# Patient Record
Sex: Female | Born: 1995 | Race: Black or African American | Hispanic: No | Marital: Single | Smoking: Never smoker
Health system: Southern US, Community
[De-identification: ages and names within clinical notes are randomized; demographics above are authoritative.]

## PROBLEM LIST (undated history)

## (undated) DIAGNOSIS — E119 Type 2 diabetes mellitus without complications: Secondary | ICD-10-CM

## (undated) DIAGNOSIS — F329 Major depressive disorder, single episode, unspecified: Secondary | ICD-10-CM

## (undated) DIAGNOSIS — F32A Depression, unspecified: Secondary | ICD-10-CM

## (undated) DIAGNOSIS — F419 Anxiety disorder, unspecified: Secondary | ICD-10-CM

## (undated) HISTORY — DX: Type 2 diabetes mellitus without complications: E11.9

---

## 2016-07-19 DIAGNOSIS — E669 Obesity, unspecified: Secondary | ICD-10-CM | POA: Insufficient documentation

## 2016-07-19 DIAGNOSIS — E1140 Type 2 diabetes mellitus with diabetic neuropathy, unspecified: Secondary | ICD-10-CM | POA: Insufficient documentation

## 2016-07-19 DIAGNOSIS — E1165 Type 2 diabetes mellitus with hyperglycemia: Secondary | ICD-10-CM | POA: Insufficient documentation

## 2016-11-07 ENCOUNTER — Inpatient Hospital Stay
Admission: EM | Admit: 2016-11-07 | Discharge: 2016-11-19 | DRG: 885 | Disposition: A | Discharge status: Home / Self Care | Payer: BLUE CROSS/BLUE SHIELD | Source: Intra-hospital | Attending: Psychiatry | Admitting: Psychiatry

## 2016-11-07 ENCOUNTER — Ambulatory Visit (HOSPITAL_COMMUNITY)
Admission: RE | Admit: 2016-11-07 | Discharge: 2016-11-07 | Disposition: A | Discharge status: Home / Self Care | Payer: BLUE CROSS/BLUE SHIELD | Attending: Psychiatry | Admitting: Psychiatry

## 2016-11-07 ENCOUNTER — Encounter: Payer: Self-pay | Admitting: Psychiatry

## 2016-11-07 ENCOUNTER — Encounter (HOSPITAL_COMMUNITY): Payer: Self-pay | Admitting: Emergency Medicine

## 2016-11-07 ENCOUNTER — Emergency Department (HOSPITAL_COMMUNITY)
Admission: EM | Admit: 2016-11-07 | Discharge: 2016-11-07 | Disposition: A | Discharge status: Other Acute Inpatient Hospital | Payer: BLUE CROSS/BLUE SHIELD | Attending: Emergency Medicine | Admitting: Emergency Medicine

## 2016-11-07 DIAGNOSIS — R45851 Suicidal ideations: Secondary | ICD-10-CM | POA: Diagnosis present

## 2016-11-07 DIAGNOSIS — Z5181 Encounter for therapeutic drug level monitoring: Secondary | ICD-10-CM | POA: Insufficient documentation

## 2016-11-07 DIAGNOSIS — F332 Major depressive disorder, recurrent severe without psychotic features: Secondary | ICD-10-CM | POA: Diagnosis present

## 2016-11-07 DIAGNOSIS — Z79899 Other long term (current) drug therapy: Secondary | ICD-10-CM | POA: Diagnosis not present

## 2016-11-07 DIAGNOSIS — T43296A Underdosing of other antidepressants, initial encounter: Secondary | ICD-10-CM | POA: Diagnosis present

## 2016-11-07 DIAGNOSIS — E119 Type 2 diabetes mellitus without complications: Secondary | ICD-10-CM | POA: Diagnosis present

## 2016-11-07 DIAGNOSIS — R630 Anorexia: Secondary | ICD-10-CM | POA: Diagnosis present

## 2016-11-07 DIAGNOSIS — Z9119 Patient's noncompliance with other medical treatment and regimen: Secondary | ICD-10-CM

## 2016-11-07 DIAGNOSIS — G4700 Insomnia, unspecified: Secondary | ICD-10-CM | POA: Diagnosis present

## 2016-11-07 DIAGNOSIS — Z7984 Long term (current) use of oral hypoglycemic drugs: Secondary | ICD-10-CM

## 2016-11-07 DIAGNOSIS — Z9114 Patient's other noncompliance with medication regimen: Secondary | ICD-10-CM | POA: Diagnosis not present

## 2016-11-07 DIAGNOSIS — F322 Major depressive disorder, single episode, severe without psychotic features: Secondary | ICD-10-CM

## 2016-11-07 DIAGNOSIS — F4310 Post-traumatic stress disorder, unspecified: Secondary | ICD-10-CM | POA: Diagnosis present

## 2016-11-07 DIAGNOSIS — F411 Generalized anxiety disorder: Secondary | ICD-10-CM | POA: Diagnosis present

## 2016-11-07 DIAGNOSIS — J00 Acute nasopharyngitis [common cold]: Secondary | ICD-10-CM | POA: Diagnosis present

## 2016-11-07 DIAGNOSIS — T43226A Underdosing of selective serotonin reuptake inhibitors, initial encounter: Secondary | ICD-10-CM | POA: Diagnosis present

## 2016-11-07 DIAGNOSIS — Z915 Personal history of self-harm: Secondary | ICD-10-CM | POA: Diagnosis not present

## 2016-11-07 HISTORY — DX: Major depressive disorder, single episode, unspecified: F32.9

## 2016-11-07 HISTORY — DX: Depression, unspecified: F32.A

## 2016-11-07 HISTORY — DX: Type 2 diabetes mellitus without complications: E11.9

## 2016-11-07 HISTORY — DX: Anxiety disorder, unspecified: F41.9

## 2016-11-07 LAB — COMPREHENSIVE METABOLIC PANEL
ALK PHOS: 46 U/L (ref 38–126)
ALT: 10 U/L — AB (ref 14–54)
AST: 16 U/L (ref 15–41)
Albumin: 4.1 g/dL (ref 3.5–5.0)
Anion gap: 7 (ref 5–15)
BUN: 8 mg/dL (ref 6–20)
CALCIUM: 9.5 mg/dL (ref 8.9–10.3)
CHLORIDE: 104 mmol/L (ref 101–111)
CO2: 25 mmol/L (ref 22–32)
Creatinine, Ser: 0.65 mg/dL (ref 0.44–1.00)
GFR calc Af Amer: >60 mL/min (ref >60)
GFR calc non Af Amer: >60 mL/min (ref >60)
GLUCOSE: 164 mg/dL — AB (ref 65–99)
Potassium: 3.6 mmol/L (ref 3.5–5.1)
SODIUM: 136 mmol/L (ref 135–145)
Total Bilirubin: 0.4 mg/dL (ref 0.3–1.2)
Total Protein: 9.1 g/dL — ABNORMAL HIGH (ref 6.5–8.1)

## 2016-11-07 LAB — I-STAT BETA HCG BLOOD, ED (MC, WL, AP ONLY)

## 2016-11-07 LAB — RAPID URINE DRUG SCREEN, HOSP PERFORMED
AMPHETAMINES: NOT DETECTED
BARBITURATES: NOT DETECTED
Benzodiazepines: NOT DETECTED
Cocaine: NOT DETECTED
OPIATES: NOT DETECTED
TETRAHYDROCANNABINOL: NOT DETECTED

## 2016-11-07 LAB — CBC
HEMATOCRIT: 32.7 % — AB (ref 36.0–46.0)
HEMOGLOBIN: 10.5 g/dL — AB (ref 12.0–15.0)
MCH: 23.4 pg — AB (ref 26.0–34.0)
MCHC: 32.1 g/dL (ref 30.0–36.0)
MCV: 72.8 fL — AB (ref 78.0–100.0)
Platelets: 409 10*3/uL — ABNORMAL HIGH (ref 150–400)
RBC: 4.49 MIL/uL (ref 3.87–5.11)
RDW: 17.5 % — ABNORMAL HIGH (ref 11.5–15.5)
WBC: 12.4 10*3/uL — ABNORMAL HIGH (ref 4.0–10.5)

## 2016-11-07 LAB — SALICYLATE LEVEL: Salicylate Lvl: <7 mg/dL (ref 2.8–30.0)

## 2016-11-07 LAB — ETHANOL: Alcohol, Ethyl (B): <5 mg/dL (ref <5)

## 2016-11-07 LAB — ACETAMINOPHEN LEVEL: Acetaminophen (Tylenol), Serum: <10 ug/mL — ABNORMAL LOW (ref 10–30)

## 2016-11-07 LAB — GLUCOSE, CAPILLARY
Glucose-Capillary: 129 mg/dL — ABNORMAL HIGH (ref 65–99)
Glucose-Capillary: 116 mg/dL — ABNORMAL HIGH (ref 65–99)

## 2016-11-07 MED ORDER — GABAPENTIN 100 MG PO CAPS: 100.0000 mg | ORAL_CAPSULE | Freq: Three times a day (TID) | ORAL | Status: DC

## 2016-11-07 MED ORDER — INSULIN ASPART 100 UNIT/ML ~~LOC~~ SOLN
0.0000 [IU] | Freq: Three times a day (TID) | SUBCUTANEOUS | Status: DC
  Administered 2016-11-10 – 2016-11-12 (×5): 1 [IU] via SUBCUTANEOUS
  Administered 2016-11-13: 2 [IU] via SUBCUTANEOUS
  Administered 2016-11-13: 1 [IU] via SUBCUTANEOUS
  Administered 2016-11-14: 2 [IU] via SUBCUTANEOUS
  Administered 2016-11-15 – 2016-11-16 (×3): 1 [IU] via SUBCUTANEOUS
  Administered 2016-11-16 – 2016-11-17 (×2): 2 [IU] via SUBCUTANEOUS
  Administered 2016-11-18 – 2016-11-19 (×3): 1 [IU] via SUBCUTANEOUS
  Filled 2016-11-07: qty 1
  Filled 2016-11-07: qty 2
  Filled 2016-11-07 (×7): qty 1
  Filled 2016-11-07: qty 2
  Filled 2016-11-07 (×2): qty 1
  Filled 2016-11-07 (×2): qty 2
  Filled 2016-11-07: qty 1
  Filled 2016-11-07: qty 2
  Filled 2016-11-07: qty 1

## 2016-11-07 MED ORDER — METFORMIN HCL ER 500 MG PO TB24
1000.0000 mg | ORAL_TABLET | Freq: Two times a day (BID) | ORAL | Status: DC
  Administered 2016-11-07 – 2016-11-19 (×24): 1000 mg via ORAL
  Filled 2016-11-07 (×25): qty 2

## 2016-11-07 MED ORDER — PNEUMOCOCCAL VAC POLYVALENT 25 MCG/0.5ML IJ INJ
0.5000 mL | INJECTION | INTRAMUSCULAR | Status: AC
  Administered 2016-11-10: 0.5 mL via INTRAMUSCULAR
  Filled 2016-11-07: qty 0.5

## 2016-11-07 MED ORDER — METFORMIN HCL ER 500 MG PO TB24
1000.0000 mg | ORAL_TABLET | Freq: Two times a day (BID) | ORAL | Status: DC
  Administered 2016-11-07: 1000 mg via ORAL
  Filled 2016-11-07 (×2): qty 2

## 2016-11-07 MED ORDER — ACETAMINOPHEN 325 MG PO TABS
650.0000 mg | ORAL_TABLET | Freq: Four times a day (QID) | ORAL | Status: DC | PRN
  Administered 2016-11-08 – 2016-11-17 (×6): 650 mg via ORAL
  Filled 2016-11-07 (×6): qty 2

## 2016-11-07 MED ORDER — CANAGLIFLOZIN 100 MG PO TABS
100.0000 mg | ORAL_TABLET | Freq: Every day | ORAL | Status: DC
  Administered 2016-11-07: 100 mg via ORAL
  Filled 2016-11-07: qty 1

## 2016-11-07 MED ORDER — MAGNESIUM HYDROXIDE 400 MG/5ML PO SUSP: 30.0000 mL | Freq: Every day | ORAL | Status: DC | PRN

## 2016-11-07 MED ORDER — LAMOTRIGINE 25 MG PO TABS
25.0000 mg | ORAL_TABLET | Freq: Every day | ORAL | Status: DC
  Administered 2016-11-08 – 2016-11-13 (×6): 25 mg via ORAL
  Filled 2016-11-07 (×6): qty 1

## 2016-11-07 MED ORDER — GABAPENTIN 300 MG PO CAPS
300.0000 mg | ORAL_CAPSULE | Freq: Three times a day (TID) | ORAL | Status: DC
  Administered 2016-11-07: 300 mg via ORAL
  Filled 2016-11-07: qty 1

## 2016-11-07 MED ORDER — INSULIN ASPART 100 UNIT/ML ~~LOC~~ SOLN: 0.0000 [IU] | Freq: Every day | SUBCUTANEOUS | Status: DC

## 2016-11-07 MED ORDER — INFLUENZA VAC SPLIT QUAD 0.5 ML IM SUSY
0.5000 mL | PREFILLED_SYRINGE | INTRAMUSCULAR | Status: AC
  Administered 2016-11-10: 0.5 mL via INTRAMUSCULAR
  Filled 2016-11-07: qty 0.5

## 2016-11-07 MED ORDER — ALUM & MAG HYDROXIDE-SIMETH 200-200-20 MG/5ML PO SUSP: 30.0000 mL | ORAL | Status: DC | PRN

## 2016-11-07 MED ORDER — GABAPENTIN 100 MG PO CAPS
100.0000 mg | ORAL_CAPSULE | Freq: Three times a day (TID) | ORAL | Status: DC
  Administered 2016-11-07 – 2016-11-08 (×2): 100 mg via ORAL
  Filled 2016-11-07 (×2): qty 1

## 2016-11-07 MED ORDER — CANAGLIFLOZIN 100 MG PO TABS: 100.0000 mg | ORAL_TABLET | Freq: Every day | ORAL | Status: DC

## 2016-11-07 MED ORDER — LAMOTRIGINE 25 MG PO TABS
25.0000 mg | ORAL_TABLET | Freq: Every day | ORAL | Status: DC
  Administered 2016-11-07: 25 mg via ORAL
  Filled 2016-11-07: qty 1
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
## 2016-11-07 NOTE — ED Triage Notes (Signed)
Pt comes from [facility] for [facility]. States she has been seen for depression before but is not on any medication at this time.  Lives with parents and siblings.  Endorses self harm with plans of cutting.  Denies HI. Pt very slow to answer questions occasionally not answering at all.

## 2016-11-07 NOTE — Progress Notes (Signed)
Patient has been accepted to [facility] per Charge Nurse Victorino DikeJennifer Patient assigned to room 305 Accepting and attending physician is Dr. Jennet MaduroPucilowska.  Call report to [phone].  Representative was [employer] [facility].  Wanya Bangura K. Sherlon HandingHarris, LCAS-A, LPC-A, [facility]  Counselor 11/07/2016 2:15 PM

## 2016-11-07 NOTE — H&P (Signed)
Behavioral Health Medical Screening Exam  Renee Turner is an 21 y.o. female, presents to [facility] with concerns with progressive depressive symptoms culminating into SI and self inflicted wounds. She has a hx of Diabetes but denies any acute illnesses, injuries or physical ailments.  Total Time spent with patient: 20 minutes  Psychiatric Specialty Exam: Physical Exam  Constitutional: She is oriented to person, place, and time. She appears well-developed and well-nourished. No distress.  HENT:  Head: Normocephalic.  Eyes: Pupils are equal, round, and reactive to light.  Neck: Normal range of motion. Neck supple.  Respiratory: Effort normal.  Neurological: She is alert and oriented to person, place, and time. No cranial nerve deficit.  Skin: Skin is warm and dry. She is not diaphoretic.  Psychiatric: Her speech is delayed. She is withdrawn. She exhibits a depressed mood. She expresses suicidal ideation.    Review of Systems  Psychiatric/Behavioral: Positive for depression and suicidal ideas.  All other systems reviewed and are negative.   There were no vitals taken for this visit.There is no height or weight on file to calculate BMI.  General Appearance: Casual  Eye Contact:  Poor  Speech:  Blocked  Volume:  Decreased  Mood:  Depressed  Affect:  Depressed and Flat  Thought Process:  Goal Directed  Orientation:  Full (Time, Place, and Person)  Thought Content:  Negative  Suicidal Thoughts:  Yes.  without intent/plan  Homicidal Thoughts:  No  Memory:  Immediate;   Fair  Judgement:  Fair  Insight:  Fair  Psychomotor Activity:  Decreased  Concentration: Concentration: Poor  Recall:  [employer] of Knowledge:Fair  Language: Fair  Akathisia:  Negative  Handed:  Right  AIMS (if indicated):     Assets:  Desire for Improvement  Sleep:       Musculoskeletal: Strength & Muscle Tone: within normal limits Gait & Station: normal Patient leans: N/A  There were no vitals taken for this  visit.  Recommendations:  Based on my evaluation the patient does not appear to have an emergency medical condition.  Esperanza Madrazo E, PA-C 11/07/2016, 1:05 AM

## 2016-11-07 NOTE — BH Assessment (Signed)
[facility] Assessment Progress Note  Per Thedore MinsMojeed Akintayo, MD, this pt requires psychiatric hospitalization. Tonita PhoenixShean Harris calls from [facility] to report that pt has been accepted to their facility by Dr Jennet MaduroPucilowska to Rm 305.  Pt has signed Consent for Admission and Treatment, as well as Consent for Release of Information, and these documents have been faxed to [phone].  Pt's nurse, Diane, has been notified, and agrees to call report to [phone].  Pt [facility] to be [facility].   Thomson Herbers, MA Triage Specialist [phone]

## 2016-11-07 NOTE — Progress Notes (Signed)
11/07/16 1408:  LRT went to pt room to offer activities, pt was sleep.  Caroll RancherMarjette Vernica Wachtel, LRT/CTRS

## 2016-11-07 NOTE — Progress Notes (Signed)
D: Patient is alert and oriented on the unit this shift. Patient not attended and actively participated in groups today. Patient denies suicidal ideation, homicidal ideation, auditory or visual hallucinations at the present time.  A: Scheduled medications are administered to patient as per MD orders. Emotional support and encouragement are provided. Patient is maintained on q.15 minute safety checks. Patient is informed to notify staff with questions or concerns. R: No adverse medication reactions are noted. Patient is isolative but cooperative with medication administration with encouragement   Patient is non  receptive, anxious ,depressed and cooperative on the unit at this time. Patient isolative and does not interact    with others on the unit this shift. Patient contracts for safety at this time. Patient remains safe at this time. 3/10 anxiety 6/10 depression

## 2016-11-07 NOTE — ED Notes (Signed)
Introduced self to patient. Pt oriented to unit expectations.  Assessed pt for:  A) Anxiety &/or agitation: Pt is anxious with a flat affect, poor eye contact and minimal verbal communication. She gives one-word answers to assessment questions. She does indicate that she is suicidal with a plan, but does not elaborate.  S) Safety: Safety maintained with q-15-minute checks and hourly rounds by staff.  A) ADLs: Pt able to perform ADLs independently.  P) Pick-Up (room cleanliness): Pt's room clean and free of clutter.

## 2016-11-07 NOTE — ED Provider Notes (Signed)
Emergency Department Provider Note   I have reviewed the triage vital signs and the nursing notes.   HISTORY  Chief Complaint Medical Clearance and Suicidal   HPI Renee Turner is a 21 y.o. female with PMH of depression, anxiety, and DM presents to the emergency department for evaluation of increasing suicidal thoughts and depression. Patient states that her suicidal thinking became particularly bad and disruptive yesterday. She states that she's been off of her psychiatric medications for the last several months. She states that she had a psychiatrist in the recent past but stopped seeing them. She is unwilling to provide an explanation as to why this is her why she stopped her medications. She denies any alcohol or drug use at this time. She denies any homicidal ideation. Denies auditory or visual hallucinations. She will not comment when asked if she developed a plan to hurt herself or if she is attempted suicide in the past. She denies having any medical complaints. She states she has been compliant with her diabetes medication and gabapentin and home.   Past Medical History:  Diagnosis Date  . Anxiety   . Depression   . Diabetes mellitus without complication ([place])    Type II    There are no active problems to display for this patient.   History reviewed. No pertinent surgical history.  Current Outpatient Rx  . Order #: [id number] Class: Historical Med  . Order #: [id number] Class: Historical Med  . Order #: [id number] Class: Historical Med  . Order #: [id number] Class: Historical Med    Allergies Patient has no known allergies.  No family history on file.  Social History Social History  Substance Use Topics  . Smoking status: Never Smoker  . Smokeless tobacco: Never Used  . Alcohol use No    Review of Systems  Constitutional: No fever/chills Eyes: No visual changes. ENT: No sore throat. Cardiovascular: Denies chest pain. Respiratory: Denies shortness of  breath. Gastrointestinal: No abdominal pain.  No nausea, no vomiting.  No diarrhea.  No constipation. Genitourinary: Negative for dysuria. Musculoskeletal: Negative for back pain. Skin: Negative for rash. Neurological: Negative for headaches, focal weakness or numbness. Psychiatric:Worsening depression and suicidal thinking.   10-point ROS otherwise negative.  ____________________________________________   PHYSICAL EXAM:  VITAL SIGNS: ED Triage Vitals  Enc Vitals Group     BP 11/07/16 0216 115/74     Pulse Rate 11/07/16 0216 70     Resp 11/07/16 0216 16     Temp 11/07/16 0216 98 F (36.7 C)     Temp Source 11/07/16 0216 Oral     SpO2 11/07/16 0216 98 %     Weight 11/07/16 0220 200 lb (90.7 kg)     Height 11/07/16 0220 5' 7.5" (1.715 m)   Constitutional: Alert and oriented. Speaking in very quiet voice.  Eyes: Conjunctivae are normal.  Head: Atraumatic. Nose: No congestion/rhinnorhea. Mouth/Throat: Mucous membranes are moist.  Oropharynx non-erythematous. Neck: No stridor.   Cardiovascular: Normal rate, regular rhythm. Good peripheral circulation. Grossly normal heart sounds.   Respiratory: Normal respiratory effort.  No retractions. Lungs CTAB. Gastrointestinal: Soft and nontender. No distention.  Musculoskeletal: No lower extremity tenderness nor edema. No gross deformities of extremities. Neurologic:  Normal speech and language. No gross focal neurologic deficits are appreciated.  Skin:  Skin is warm, dry and intact. No rash noted. Psychiatric: Mood is low. Affect is muted. Speech and behavior are soft and quiet. Not obviously responding to internal stimulus.   ____________________________________________   LABS (all labs  ordered are listed, but only abnormal results are displayed)  Labs Reviewed  COMPREHENSIVE METABOLIC PANEL - Abnormal; Notable for the following:       Result Value   Glucose, Bld 164 (*)    Total Protein 9.1 (*)    ALT 10 (*)    All other  components within normal limits  ACETAMINOPHEN LEVEL - Abnormal; Notable for the following:    Acetaminophen (Tylenol), Serum <10 (*)    All other components within normal limits  CBC - Abnormal; Notable for the following:    WBC 12.4 (*)    Hemoglobin 10.5 (*)    HCT 32.7 (*)    MCV 72.8 (*)    MCH 23.4 (*)    RDW 17.5 (*)    Platelets 409 (*)    All other components within normal limits  ETHANOL  SALICYLATE LEVEL  RAPID URINE DRUG SCREEN, HOSP PERFORMED  I-STAT BETA HCG BLOOD, ED ([facility], [facility], [facility] ONLY)    ____________________________________________   PROCEDURES  Procedure(s) performed:   Procedures  None ____________________________________________   INITIAL IMPRESSION / ASSESSMENT AND PLAN / ED COURSE  Pertinent labs & imaging results that were available during my care of the patient were reviewed by me and considered in my medical decision making (see chart for details).  Patient presents to the emergency department for evaluation of worsening depression and suicidal thinking. She has been off of medications for the past several months. Unclear if she has developed a plan to harm herself. I have reviewed the patient's medical screening labs with no significant abnormality. She does have a mild leukocytosis of unclear significance. I do not feel this is contributing to her psychiatric condition. I restarted her home diabetes medications. She is not currently taking any medication for depression. She is calm and cooperative at this time. She denies alcohol use. At this time I consider the patient medically cleared for psychiatric evaluation and have placed TTS consult.    ____________________________________________  FINAL CLINICAL IMPRESSION(S) / ED DIAGNOSES  Final diagnoses:  Depression, unspecified depression type     MEDICATIONS GIVEN DURING THIS VISIT:  Medications  canagliflozin (INVOKANA) tablet 100 mg (not administered)  gabapentin (NEURONTIN) capsule 300  mg (not administered)  metFORMIN (GLUCOPHAGE-XR) 24 hr tablet 1,000 mg (not administered)     NEW OUTPATIENT MEDICATIONS STARTED DURING THIS VISIT:  None   Note:  This document was prepared using Dragon voice recognition software and may include unintentional dictation errors.  Nanda Quinton, MD Emergency Medicine   Margette Fast, MD 11/07/16 [phone]

## 2016-11-07 NOTE — ED Notes (Signed)
Patient is alert and oriented. Patient is soft spoken. Patient states she has been depressed and is thinking of harming self. Patient search completed and no contraband found. Q 15 minute checks in progress and patient remains safe on unit.

## 2016-11-07 NOTE — ED Notes (Signed)
Pt transported to [facility]. All belongings returned to pt who signed for same.

## 2016-11-08 DIAGNOSIS — F322 Major depressive disorder, single episode, severe without psychotic features: Secondary | ICD-10-CM

## 2016-11-08 LAB — GLUCOSE, CAPILLARY
GLUCOSE-CAPILLARY: 118 mg/dL — AB (ref 65–99)
Glucose-Capillary: 117 mg/dL — ABNORMAL HIGH (ref 65–99)
Glucose-Capillary: 118 mg/dL — ABNORMAL HIGH (ref 65–99)
Glucose-Capillary: 110 mg/dL — ABNORMAL HIGH (ref 65–99)

## 2016-11-08 MED ORDER — SERTRALINE HCL 50 MG PO TABS
50.0000 mg | ORAL_TABLET | Freq: Every day | ORAL | Status: DC
  Administered 2016-11-08 – 2016-11-11 (×4): 50 mg via ORAL
  Filled 2016-11-08 (×4): qty 1

## 2016-11-08 MED ORDER — GABAPENTIN 300 MG PO CAPS
300.0000 mg | ORAL_CAPSULE | Freq: Three times a day (TID) | ORAL | Status: DC
Start: 2016-11-08 — End: 2016-11-19
  Administered 2016-11-08 – 2016-11-19 (×34): 300 mg via ORAL
  Filled 2016-11-08 (×34): qty 1

## 2016-11-08 MED ORDER — BUPROPION HCL ER (XL) 150 MG PO TB24
150.0000 mg | ORAL_TABLET | Freq: Every day | ORAL | Status: DC
  Administered 2016-11-08 – 2016-11-14 (×7): 150 mg via ORAL
  Filled 2016-11-08 (×7): qty 1

## 2016-11-08 MED ORDER — NON FORMULARY: 2.0000 mg | Status: DC

## 2016-11-08 NOTE — Progress Notes (Signed)
D: Patient appears blank has minimal interaction. Denies SI/HI/AVH. Patient has been isolative to her room. States she's having pain from being on her menstrual cycle.  A: Medication given with education. Encouragement provided. PRN Tylenol given.  R: Patient was compliant with medication. She has remained calm and cooperative. Safety maintained with 15 min checks.

## 2016-11-08 NOTE — Progress Notes (Signed)
Recreation Therapy Notes  Date: 01.04.18 Time: 1:00 pm Location: Craft Room  Group Topic: Leisure Education  Goal Area(s) Addresses:  Patient will identify things they are grateful for. Patient will identify how being grateful can influence decision making.  Behavioral Response: Attentive  Intervention: LexicographerGrateful Wheel  Activity: Patients were given an I Am Grateful For worksheet and were instructed to write things they are grateful for under each category.  Education: LRT educated patients on why it is important to be grateful.  Education Outcome: In group clarification offered  Clinical Observations/Feedback: Patient wrote things she was grateful for. Patient did not contribute to group discussion.  Jacquelynn CreeGreene,Swathi Dauphin M, LRT/CTRS 11/08/2016 2:12 PM
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
## 2016-11-08 NOTE — Plan of Care (Signed)
Problem: Medication: Goal: Compliance with prescribed medication regimen will improve Outcome: Progressing Patient has been compliant with medication during this shift.    

## 2016-11-08 NOTE — Tx Team (Signed)
Interdisciplinary Treatment and Diagnostic Plan Update  11/08/2016 Time of Session: 10:30am Renee Turner MRN: [id number]  Principal Diagnosis: Major depressive disorder, single episode, severe without psychotic features ([name])  Secondary Diagnoses: Principal Problem:   Major depressive disorder, single episode, severe without psychotic features ([name]) Active Problems:   Diabetes ([name])   Current Medications:  Current Facility-Administered Medications  Medication Dose Route Frequency Provider Last Rate Last Dose  . acetaminophen (TYLENOL) tablet 650 mg  650 mg Oral Q6H PRN Jamison Y Lord, NP   650 mg at 11/08/16 1156  . alum & mag hydroxide-simeth (MAALOX/MYLANTA) 200-200-20 MG/5ML suspension 30 mL  30 mL Oral Q4H PRN Jamison Y Lord, NP      . buPROPion (WELLBUTRIN XL) 24 hr tablet 150 mg  150 mg Oral Daily Jolanta B Pucilowska, MD   150 mg at 11/08/16 1020  . gabapentin (NEURONTIN) capsule 300 mg  300 mg Oral TID Jolanta B Pucilowska, MD   300 mg at 11/08/16 1156  . Influenza vac split quadrivalent PF (FLUARIX) injection 0.5 mL  0.5 mL Intramuscular Tomorrow-1000 Jolanta B Pucilowska, MD      . insulin aspart (novoLOG) injection 0-5 Units  0-5 Units Subcutaneous QHS Jamison Y Lord, NP      . insulin aspart (novoLOG) injection 0-9 Units  0-9 Units Subcutaneous TID WC Jamison Y Lord, NP   Stopped at 11/08/16 0800  . lamoTRIgine (LAMICTAL) tablet 25 mg  25 mg Oral Daily Jamison Y Lord, NP   25 mg at 11/08/16 0905  . magnesium hydroxide (MILK OF MAGNESIA) suspension 30 mL  30 mL Oral Daily PRN Jamison Y Lord, NP      . metFORMIN (GLUCOPHAGE-XR) 24 hr tablet 1,000 mg  1,000 mg Oral BID WC Jamison Y Lord, NP   1,000 mg at 11/08/16 0905  . NON FORMULARY 2 mg  2 mg Subcutaneous Weekly Jolanta B Pucilowska, MD      . pneumococcal 23 valent vaccine (PNU-IMMUNE) injection 0.5 mL  0.5 mL Intramuscular Tomorrow-1000 Jolanta B Pucilowska, MD      . sertraline (ZOLOFT) tablet 50 mg  50 mg Oral QHS Jolanta B  Pucilowska, MD       PTA Medications: Prescriptions Prior to Admission  Medication Sig Dispense Refill Last Dose  . Exenatide ER (BYDUREON) 2 MG PEN Inject 2 mg into the skin once a week.   Past Month at Unknown time  . FARXIGA 10 MG TABS tablet Take 1 tablet by mouth daily.  2 Past Week at Unknown time  . gabapentin (NEURONTIN) 300 MG capsule Take 1 capsule by mouth 3 (three) times daily.  2 Past Week at Unknown time  . metFORMIN (GLUCOPHAGE-XR) 500 MG 24 hr tablet Take 1,000 mg by mouth 2 (two) times daily.  3 11/06/2016 at Unknown time    Patient Stressors: Marital or family conflict Medication change or noncompliance Traumatic event  Patient Strengths: Ability for insight Average or above average intelligence General fund of knowledge Motivation for treatment/growth Physical Health Supportive family/friends Work skills  Treatment Modalities: Medication Management, Group therapy, Case management,  1 to 1 session with clinician, Psychoeducation, Recreational therapy.   Physician Treatment Plan for Primary Diagnosis: Major depressive disorder, single episode, severe without psychotic features ([name]) Long Term Goal(s): Improvement in symptoms so as ready for discharge NA   Short Term Goals: Ability to identify changes in lifestyle to reduce recurrence of condition will improve Ability to verbalize feelings will improve Ability to disclose and discuss suicidal ideas Ability   to demonstrate self-control will improve Ability to identify and develop effective coping behaviors will improve Ability to maintain clinical measurements within normal limits will improve Compliance with prescribed medications will improve Ability to identify triggers associated with substance abuse/mental health issues will improve NA  Medication Management: Evaluate patient's response, side effects, and tolerance of medication regimen.  Therapeutic Interventions: 1 to 1 sessions, Unit Group sessions and  Medication administration.  Evaluation of Outcomes: Progressing  Physician Treatment Plan for Secondary Diagnosis: Principal Problem:   Major depressive disorder, single episode, severe without psychotic features ([place]) Active Problems:   Diabetes ([place])  Long Term Goal(s): Improvement in symptoms so as ready for discharge NA   Short Term Goals: Ability to identify changes in lifestyle to reduce recurrence of condition will improve Ability to verbalize feelings will improve Ability to disclose and discuss suicidal ideas Ability to demonstrate self-control will improve Ability to identify and develop effective coping behaviors will improve Ability to maintain clinical measurements within normal limits will improve Compliance with prescribed medications will improve Ability to identify triggers associated with substance abuse/mental health issues will improve NA     Medication Management: Evaluate patient's response, side effects, and tolerance of medication regimen.  Therapeutic Interventions: 1 to 1 sessions, Unit Group sessions and Medication administration.  Evaluation of Outcomes: Progressing   RN Treatment Plan for Primary Diagnosis: Major depressive disorder, single episode, severe without psychotic features ([place]) Long Term Goal(s): Knowledge of disease and therapeutic regimen to maintain health will improve  Short Term Goals: Ability to verbalize feelings will improve, Ability to disclose and discuss suicidal ideas, Ability to identify and develop effective coping behaviors will improve and Compliance with prescribed medications will improve  Medication Management: RN will administer medications as ordered by provider, will assess and evaluate patient's response and provide education to patient for prescribed medication. RN will report any adverse and/or side effects to prescribing provider.  Therapeutic Interventions: 1 on 1 counseling sessions, Psychoeducation, Medication  administration, Evaluate responses to treatment, Monitor vital signs and CBGs as ordered, Perform/monitor CIWA, COWS, AIMS and Fall Risk screenings as ordered, Perform wound care treatments as ordered.  Evaluation of Outcomes: Progressing   LCSW Treatment Plan for Primary Diagnosis: Major depressive disorder, single episode, severe without psychotic features ([place]) Long Term Goal(s): Safe transition to appropriate next level of care at discharge, Engage patient in therapeutic group addressing interpersonal concerns.  Short Term Goals: Engage patient in aftercare planning with referrals and resources, Increase social support, Increase ability to appropriately verbalize feelings, Increase emotional regulation, Facilitate acceptance of mental health diagnosis and concerns and Increase skills for wellness and recovery  Therapeutic Interventions: Assess for all discharge needs, 1 to 1 time with Social worker, Explore available resources and support systems, Assess for adequacy in community support network, Educate family and significant other(s) on suicide prevention, Complete Psychosocial Assessment, Interpersonal group therapy.  Evaluation of Outcomes: Progressing   Progress in Treatment: Attending groups: No. Participating in groups: No. Taking medication as prescribed: Yes. Toleration medication: Yes. Family/Significant other contact made: No, will contact:  identified support person with permission from patient.  Patient understands diagnosis: Yes. Discussing patient identified problems/goals with staff: Yes. Medical problems stabilized or resolved: Yes. Denies suicidal/homicidal ideation: Yes. Issues/concerns per patient self-inventory: No. Other: n/a  New problem(s) identified: None identified at this time.  New Short Term/Long Term Goal(s): None identified at this time.   Discharge Plan or Barriers: Patient will follow-up with outpatient services for medication management and  outpatient  therapy.   Reason for Continuation of Hospitalization: Anxiety Depression Medication stabilization Suicidal ideation  Estimated Length of Stay: 5 days.   Attendees: Patient: Renee Turner 11/08/2016 1:19 PM  Physician: Dr. Bary Leriche, MD 11/08/2016 1:19 PM  Nursing: Elige Radon, RN 11/08/2016 1:19 PM  RN Care Manager: 11/08/2016 1:19 PM  Social Worker: Jen Mow. Satira Sark 11/08/2016 1:19 PM  Recreational Therapist: Leonette Monarch, LRT/CTRS 11/08/2016 1:19 PM  Other:  11/08/2016 1:19 PM  Other:  11/08/2016 1:19 PM  Other: 11/08/2016 1:19 PM    Scribe for Treatment Team: Jolaine Click, LCSWA 11/08/2016 1:23 PM

## 2016-11-08 NOTE — H&P (Signed)
Psychiatric Admission Assessment Adult  Patient Identification: Renee Turner MRN:  [id number] Date of Evaluation:  11/08/2016 Chief Complaint:  Major Depressive Disorder Principal Diagnosis: Major depressive disorder, single episode, severe without psychotic features ([place]) Diagnosis:   Patient Active Problem List   Diagnosis Date Noted  . Major depressive disorder, single episode, severe without psychotic features ([place]) [F32.2] 11/07/2016  . Diabetes ([place]) [E11.9] 11/07/2016   History of Present Illness:   Identifying data. Ms. Mcmurry is a 21 year old female with history of severe depression.  Chief complaint. "Suicidal thoughts."  History of present illness. Information was obtained from the patient and the chart. The patient has a long history of depression beginning at the age of 20 with multiple bouts of depression. She has not been treated until she developed type 2 diabetes in September of 2017 and started seeing an endocrinologist. She was started on the Zoloft with adequate response but stopped taking the Zoloft. She was no longer able to see her psychiatrist due to transportation problems. She became increasingly depressed with excessive sleep, decreased appetite, anhedonia, feeling of guilt and hopelessness worthlessness, poor energy and concentration, social isolation, crying spells. She has had on and off suicidal thoughts. She is unwilling to disclose her plan. She came to [facility] emergency room complaining of worsening of depression and suicidal ideation and was admitted to the hospital. The patient presented with severe psychomotor retardation. She was there is simple answers or does not answer all. There is latency of response is tremendous. She does not maintain any eye contact. Her affect is completely flat. The patient denies symptoms of severe anxiety except for nightmares and flashbacks from PTSD stemming from traumatic experience back home in [place]. She denies psychotic symptoms  but at times appears as if attending to internal stimuli. She denies symptoms suggestive of bipolar mania. She denies drug or alcohol use.  Past psychiatric history. Her first episode of depression was at the age of 50 but she received any treatment. She denies ever attempting suicide but has history of cutting. Last Week have been over the summer. She was tried on Zoloft with good response but did not continue medication and does not have a provider at the moment.  Family psychiatric history. Nonreported.  Social history. She graduated from high school and went to [employer] for a year to study nursing. She stopped going to school when she became depressed. She did not take a medical withdrawal. 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 At the moment there are nine more family members staying with them. In September of last year she was diagnosed with type 2 diabetes and started on medication. She is partially compliant with her diabetes treatment.  Total Time spent with patient: 1 hour  Is the patient at risk to self? Yes.    Has the patient been a risk to self in the past 6 months? No.  Has the patient been a risk to self within the distant past? No.  Is the patient a risk to others? No.  Has the patient been a risk to others in the past 6 months? No.  Has the patient been a risk to others within the distant past? No.   Prior Inpatient Therapy:   Prior Outpatient Therapy:    Alcohol Screening: 1. How often do you have a drink containing alcohol?: Never 2. How many drinks containing alcohol do you have on a typical day when you are  drinking?: 1 or 2 3. How often do you have six or more drinks on one occasion?: Never Preliminary Score: 0 4. How often during the last year have you found that you were not able to stop drinking once you had started?: Never 5. How often during the last year have you failed to do what was normally expected from you  becasue of drinking?: Never 6. How often during the last year have you needed a first drink in the morning to get yourself going after a heavy drinking session?: Never 7. How often during the last year have you had a feeling of guilt of remorse after drinking?: Never 8. How often during the last year have you been unable to remember what happened the night before because you had been drinking?: Never 9. Have you or someone else been injured as a result of your drinking?: No 10. Has a relative or friend or a doctor or another health worker been concerned about your drinking or suggested you cut down?: No Alcohol Use Disorder Identification Test Final Score (AUDIT): 0 Brief Intervention: AUDIT score less than 7 or less-screening does not suggest unhealthy drinking-brief intervention not indicated Substance Abuse History in the last 12 months:  No. Consequences of Substance Abuse: NA Previous Psychotropic Medications: Yes  Psychological Evaluations: No  Past Medical History:  Past Medical History:  Diagnosis Date  . Anxiety   . Depression   . Diabetes mellitus without complication ([place])    Type II   History reviewed. No pertinent surgical history. Family History: History reviewed. No pertinent family history.  Tobacco Screening: Have you used any form of tobacco in the last 30 days? (Cigarettes, Smokeless Tobacco, Cigars, and/or Pipes): No Social History:  History  Alcohol Use No     History  Drug Use No    Additional Social History:      History of alcohol / drug use?: No history of alcohol / drug abuse                    Allergies:  No Known Allergies Lab Results:  Results for orders placed or performed during the hospital encounter of 11/07/16 (from the past 48 hour(s))  Glucose, capillary     Status: Abnormal   Collection Time: 11/07/16  4:29 PM  Result Value Ref Range   Glucose-Capillary 129 (H) 65 - 99 mg/dL  Glucose, capillary     Status: Abnormal   Collection  Time: 11/07/16 10:09 PM  Result Value Ref Range   Glucose-Capillary 116 (H) 65 - 99 mg/dL  Glucose, capillary     Status: Abnormal   Collection Time: 11/08/16  5:13 AM  Result Value Ref Range   Glucose-Capillary 117 (H) 65 - 99 mg/dL    Blood Alcohol level:  Lab Results  Component Value Date   ETH <5 53/61/4431    Metabolic Disorder Labs:  No results found for: HGBA1C, MPG No results found for: PROLACTIN No results found for: CHOL, TRIG, HDL, CHOLHDL, VLDL, LDLCALC  Current Medications: Current Facility-Administered Medications  Medication Dose Route Frequency Provider Last Rate Last Dose  . acetaminophen (TYLENOL) tablet 650 mg  650 mg Oral Q6H PRN Patrecia Pour, NP   650 mg at 11/08/16 0507  . alum & mag hydroxide-simeth (MAALOX/MYLANTA) 200-200-20 MG/5ML suspension 30 mL  30 mL Oral Q4H PRN Patrecia Pour, NP      . buPROPion (WELLBUTRIN XL) 24 hr tablet 150 mg  150 mg Oral Daily Jaleen Finch B  Takyla Kuchera, MD      . gabapentin (NEURONTIN) capsule 300 mg  300 mg Oral TID Braylea Brancato B Maribeth Jiles, MD      . Influenza vac split quadrivalent PF (FLUARIX) injection 0.5 mL  0.5 mL Intramuscular Tomorrow-1000 Denaly Gatling B Dhillon Comunale, MD      . insulin aspart (novoLOG) injection 0-5 Units  0-5 Units Subcutaneous QHS Patrecia Pour, NP      . insulin aspart (novoLOG) injection 0-9 Units  0-9 Units Subcutaneous TID WC Patrecia Pour, NP   Stopped at 11/08/16 0800  . lamoTRIgine (LAMICTAL) tablet 25 mg  25 mg Oral Daily Patrecia Pour, NP   25 mg at 11/08/16 0905  . magnesium hydroxide (MILK OF MAGNESIA) suspension 30 mL  30 mL Oral Daily PRN Patrecia Pour, NP      . metFORMIN (GLUCOPHAGE-XR) 24 hr tablet 1,000 mg  1,000 mg Oral BID WC Patrecia Pour, NP   1,000 mg at 11/08/16 0905  . pneumococcal 23 valent vaccine (PNU-IMMUNE) injection 0.5 mL  0.5 mL Intramuscular Tomorrow-1000 Trevian Hayashida B Markeesha Char, MD      . sertraline (ZOLOFT) tablet 50 mg  50 mg Oral QHS Colton Engdahl B Tacari Repass, MD       PTA  Medications: Prescriptions Prior to Admission  Medication Sig Dispense Refill Last Dose  . Exenatide ER (BYDUREON) 2 MG PEN Inject 2 mg into the skin once a week.   Past Month at Unknown time  . FARXIGA 10 MG TABS tablet Take 1 tablet by mouth daily.  2 Past Week at Unknown time  . gabapentin (NEURONTIN) 300 MG capsule Take 1 capsule by mouth 3 (three) times daily.  2 Past Week at Unknown time  . metFORMIN (GLUCOPHAGE-XR) 500 MG 24 hr tablet Take 1,000 mg by mouth 2 (two) times daily.  3 11/06/2016 at Unknown time    Musculoskeletal: Strength & Muscle Tone: within normal limits Gait & Station: normal Patient leans: N/A  Psychiatric Specialty Exam: I reviewed physical exam performed in the ER and agree with the findings. Physical Exam  Nursing note and vitals reviewed.   Review of Systems  Psychiatric/Behavioral: Positive for depression and suicidal ideas.  All other systems reviewed and are negative.   Blood pressure 103/64, pulse 66, temperature 98.5 F (36.9 C), temperature source Oral, resp. rate 18, height 5' 7.5" (1.715 m), weight 91.2 kg (201 lb), last menstrual period 10/24/2016, SpO2 100 %.Body mass index is 31.02 kg/m.  See SRA.                                                  Sleep:  Number of Hours: 5    Treatment Plan Summary: Daily contact with patient to assess and evaluate symptoms and progress in treatment and Medication management   Ms. Sayer is a 21 year old female with a history of depression and diabetes admitted for worsening of depression and suicidal ideation in the context of medication noncompliance.  1. Suicidal ideation. The patient is able to contract for safety in the hospital.  2. Mood. The patient was restarted on the Zoloft and Wellbutrin for depression. She was started on Lamictal at [facility] emergency room. I will talk to Dr. Weber Cooks about ECT consult.  3. Diabetes. The patient suffers type 2 diabetes she has been  maintained on metformin, Bydureon and Neurontin. She is  on an ADA diet with blood glucose monitoring. Sliding scale insulin available.  4. Insomnia. Trazodone is available.   5. Disposition. She will return home. She will follow up with a new psychiatrist and her endocrinologist at [facility].   Observation Level/Precautions:  15 minute checks  Laboratory:  CBC Chemistry Profile UDS UA  Psychotherapy:    Medications:    Consultations:    Discharge Concerns:    Estimated LOS:  Other:     Physician Treatment Plan for Primary Diagnosis: Major depressive disorder, single episode, severe without psychotic features ([place]) Long Term Goal(s): Improvement in symptoms so as ready for discharge  Short Term Goals: Ability to identify changes in lifestyle to reduce recurrence of condition will improve, Ability to verbalize feelings will improve, Ability to disclose and discuss suicidal ideas, Ability to demonstrate self-control will improve, Ability to identify and develop effective coping behaviors will improve, Ability to maintain clinical measurements within normal limits will improve, Compliance with prescribed medications will improve and Ability to identify triggers associated with substance abuse/mental health issues will improve  Physician Treatment Plan for Secondary Diagnosis: Principal Problem:   Major depressive disorder, single episode, severe without psychotic features ([place]) Active Problems:   Diabetes ([place])  Long Term Goal(s): NA  Short Term Goals: NA  I certify that inpatient services furnished can reasonably be expected to improve the patient's condition.    Orson Slick, MD 1/4/201810:10 AM

## 2016-11-08 NOTE — [facility] Group Notes (Signed)
Goals Group  Date/Time: 11/08/2016 9am  Type of Therapy and Topic: Group Therapy: Goals Group: SMART Goals   Pt was called, but did not attend   Chanise Habeck F. Rondey Fallen, LCSWA, LCAS  

## 2016-11-08 NOTE — Progress Notes (Signed)
Patient isolative to self and room. Forwards little. Does not answer questions. Just stares. Medication compliant. Minimal interaction with staff and peers. Pt did attend some groups today.  Encouragement and support offered. Encouraged pt to verbalize feelings. Asked pt if family was able to bring in weekly IM medication. Pt just stared and did not respond. Pt remains safe on unit with q 15 min checks.

## 2016-11-08 NOTE — [facility] Group Notes (Signed)
[facility] LCSW Group Therapy   11/08/2016  9:30 AM  Type of Therapy: Group Therapy   Participation Level: Did Not Attend. Patient invited to participate but declined.    Sarinah Doetsch F. Xavien Dauphinais, MSW, LCSWA, LCAS     

## 2016-11-08 NOTE — Plan of Care (Signed)
Problem: Safety: Goal: Ability to remain free from injury will improve Outcome: Progressing No injury reported or observed   

## 2016-11-08 NOTE — [facility] Group Notes (Signed)
[facility] Group Notes:  (Nursing/MHT/Case Management/Adjunct)  Date:  11/08/2016  Time:  3:23 PM  Type of Therapy:  Group Therapy  Participation Level:  Did Not Attend  Renee Turner 11/08/2016, 3:23 PM
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
## 2016-11-09 LAB — GLUCOSE, CAPILLARY
GLUCOSE-CAPILLARY: 86 mg/dL (ref 65–99)
GLUCOSE-CAPILLARY: 104 mg/dL — AB (ref 65–99)
Glucose-Capillary: 119 mg/dL — ABNORMAL HIGH (ref 65–99)
Glucose-Capillary: 113 mg/dL — ABNORMAL HIGH (ref 65–99)

## 2016-11-09 MED ORDER — GLUCERNA SHAKE PO LIQD
237.0000 mL | Freq: Three times a day (TID) | ORAL | Status: DC
  Administered 2016-11-09 – 2016-11-19 (×25): 237 mL via ORAL

## 2016-11-09 NOTE — Progress Notes (Signed)
Recreation Therapy Notes  Date: 01.05.17 Time: 9:30 am Location: Craft Room  Group Topic: Coping Skills  Goal Area(s) Addresses:  Patient will participate in healthy coping skill. Patient will verbalize benefit of using art as a coping skill.  Behavioral Response: Attentive  Intervention: Coloring  Activity: Patients were given coloring sheets to color and were instructed to think about what emotions they were feeling and what their minds were focused on.  Education: LRT educated patients on healthy coping skills.  Education Outcome: In group clarification offered   Clinical Observations/Feedback: Patient colored coloring sheet. Patient did not contribute to group discussion.  Jacquelynn CreeGreene,Bryna Razavi M, LRT/CTRS 11/09/2016 10:25 AM
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
## 2016-11-09 NOTE — Progress Notes (Signed)
D: Patient presents flat,  has minimal interaction. Denies SI/HI/AVH. Patient has been isolative to her room. No interaction with peers minimal interaction with staff..  A: Medication given with education. Encouragement provided.. R: Patient was compliant with medication, attends group. Safety maintained with 15 min checks.

## 2016-11-09 NOTE — [facility] Group Notes (Addendum)
[facility] LCSW Group Therapy   11/09/2016 1 PM   Type of Therapy: Group Therapy   Participation Level: Active   Participation Quality: Attentive, Sharing and Supportive   Affect: Appropriate   Cognitive: Alert and Oriented   Insight: Developing/Improving and Engaged   Engagement in Therapy: Developing/Improving and Engaged   Modes of Intervention: Clarification, Confrontation, Discussion, Education, Exploration, Limit-setting, Orientation, Problem-solving, Rapport Building, Dance movement psychotherapisteality Testing, Socialization and Support   Summary of Progress/Problems: The topic for today was feelings about relapse. Pt discussed what relapse prevention is to them and identified triggers that they are on the path to relapse. Pt processed their feeling towards relapse and was able to relate to peers. Pt discussed coping skills that can be used for relapse prevention. Pt shared that relapse for the pt means "going back to bad habits" and that for the pt relapse prevention means "moving forward forward".  Pt reported the pt's coping skills that assist the pt in avoiding relapse are "running and long-distance running".  Although presenting as very reserved and hesitant to talk the pt was pt was polite and cooperative with the CSW and other group members and focused and attentive to the topics discussed and the sharing of others.  Pt seems to have made great improvement during group, as evidenced by increased sharing, sharing at length when prompted and pt presents as increasingly energetic, as compared to previous sessions.       Dorothe PeaJonathan F. Wesson Stith, MSW, LCSWA, LCAS

## 2016-11-09 NOTE — Progress Notes (Signed)
[facility] MD Progress Note  11/09/2016 6:36 PM Renee Turner  MRN:  [id number]  Subjective:    Renee Turner is a 21 year old female with a history of severe depression admitted for worsening of her symptoms and suicidal ideation in the context of treatment noncompliance.  11/09/2016 Renee Turner appears very severely depressed. There is psychomotor retardation and poor eye contact. She has not been able to answer any questions at any length. There is a great latency of response. She was restarted on Zoloft with Wellbutrin and seems to tolerate it well. She does not interact with peers or staff and states to her room. She does not participate in activities on the unit. Her appetite is very poor. The patient has type 2 diabetes. We will and Glucerna to her regimen. We will consider ECT consult if no improvement by Monday.   Per nursing: D: Patient appears blank has minimal interaction. Denies SI/HI/AVH. Patient has been isolative to her room. States she's having pain from being on her menstrual cycle.  A: Medication given with education. Encouragement provided. PRN Tylenol given.  R: Patient was compliant with medication. She has remained calm and cooperative. Safety maintained with 15 min checks.   Principal Problem: Major depressive disorder, single episode, severe without psychotic features ([place]) Diagnosis:   Patient Active Problem List   Diagnosis Date Noted  . Major depressive disorder, single episode, severe without psychotic features ([place]) [F32.2] 11/07/2016  . Diabetes ([place]) [E11.9] 11/07/2016   Total Time spent with patient: 20 minutes  Past Psychiatric History: depression.  Past Medical History:  Past Medical History:  Diagnosis Date  . Anxiety   . Depression   . Diabetes mellitus without complication ([place])    Type II   History reviewed. No pertinent surgical history. Family History: History reviewed. No pertinent family history. Family Psychiatric  History: None reported. Social History:   History  Alcohol Use No     History  Drug Use No    Social History   Social History  . Marital status: Single    Spouse name: N/A  . Number of children: N/A  . Years of education: N/A   Social History Main Topics  . Smoking status: Never Smoker  . Smokeless tobacco: Never Used  . Alcohol use No  . Drug use: No  . Sexual activity: No   Other Topics Concern  . None   Social History Narrative  . None   Additional Social History:    History of alcohol / drug use?: No history of alcohol / drug abuse                    Sleep: Fair  Appetite:  Fair  Current Medications: Current Facility-Administered Medications  Medication Dose Route Frequency Provider Last Rate Last Dose  . acetaminophen (TYLENOL) tablet 650 mg  650 mg Oral Q6H PRN Patrecia Pour, NP   650 mg at 11/08/16 1951  . alum & mag hydroxide-simeth (MAALOX/MYLANTA) 200-200-20 MG/5ML suspension 30 mL  30 mL Oral Q4H PRN Patrecia Pour, NP      . buPROPion (WELLBUTRIN XL) 24 hr tablet 150 mg  150 mg Oral Daily Clovis Fredrickson, MD   150 mg at 11/09/16 0828  . feeding supplement (GLUCERNA SHAKE) (GLUCERNA SHAKE) liquid 237 mL  237 mL Oral TID BM Barak Bialecki B Krisy Dix, MD      . gabapentin (NEURONTIN) capsule 300 mg  300 mg Oral TID Kjell Brannen B Keniel Ralston, MD   300 mg at  11/09/16 1626  . Influenza vac split quadrivalent PF (FLUARIX) injection 0.5 mL  0.5 mL Intramuscular Tomorrow-1000 Takiya Belmares B Jara Feider, MD      . insulin aspart (novoLOG) injection 0-5 Units  0-5 Units Subcutaneous QHS Patrecia Pour, NP      . insulin aspart (novoLOG) injection 0-9 Units  0-9 Units Subcutaneous TID WC Patrecia Pour, NP   Stopped at 11/08/16 0800  . lamoTRIgine (LAMICTAL) tablet 25 mg  25 mg Oral Daily Patrecia Pour, NP   25 mg at 11/09/16 8416  . magnesium hydroxide (MILK OF MAGNESIA) suspension 30 mL  30 mL Oral Daily PRN Patrecia Pour, NP      . metFORMIN (GLUCOPHAGE-XR) 24 hr tablet 1,000 mg  1,000 mg Oral BID WC  Patrecia Pour, NP   1,000 mg at 11/09/16 1626  . NON FORMULARY 2 mg  2 mg Subcutaneous Weekly Witt Plitt B Aldyn Toon, MD      . pneumococcal 23 valent vaccine (PNU-IMMUNE) injection 0.5 mL  0.5 mL Intramuscular Tomorrow-1000 Jw Covin B Xiamara Hulet, MD      . sertraline (ZOLOFT) tablet 50 mg  50 mg Oral QHS Clovis Fredrickson, MD   50 mg at 11/08/16 2117    Lab Results:  Results for orders placed or performed during the hospital encounter of 11/07/16 (from the past 48 hour(s))  Glucose, capillary     Status: Abnormal   Collection Time: 11/07/16 10:09 PM  Result Value Ref Range   Glucose-Capillary 116 (H) 65 - 99 mg/dL  Glucose, capillary     Status: Abnormal   Collection Time: 11/08/16  5:13 AM  Result Value Ref Range   Glucose-Capillary 117 (H) 65 - 99 mg/dL  Glucose, capillary     Status: Abnormal   Collection Time: 11/08/16 11:58 AM  Result Value Ref Range   Glucose-Capillary 118 (H) 65 - 99 mg/dL   Comment 1 Notify RN   Glucose, capillary     Status: Abnormal   Collection Time: 11/08/16  4:25 PM  Result Value Ref Range   Glucose-Capillary 118 (H) 65 - 99 mg/dL  Glucose, capillary     Status: Abnormal   Collection Time: 11/08/16  7:56 PM  Result Value Ref Range   Glucose-Capillary 110 (H) 65 - 99 mg/dL  Glucose, capillary     Status: Abnormal   Collection Time: 11/09/16  6:35 AM  Result Value Ref Range   Glucose-Capillary 119 (H) 65 - 99 mg/dL  Glucose, capillary     Status: Abnormal   Collection Time: 11/09/16 11:57 AM  Result Value Ref Range   Glucose-Capillary 113 (H) 65 - 99 mg/dL  Glucose, capillary     Status: None   Collection Time: 11/09/16  4:26 PM  Result Value Ref Range   Glucose-Capillary 86 65 - 99 mg/dL   Comment 1 Notify RN     Blood Alcohol level:  Lab Results  Component Value Date   ETH <5 60/63/0160    Metabolic Disorder Labs: No results found for: HGBA1C, MPG No results found for: PROLACTIN No results found for: CHOL, TRIG, HDL, CHOLHDL, VLDL,  LDLCALC  Physical Findings: AIMS: Facial and Oral Movements Muscles of Facial Expression: None, normal Lips and Perioral Area: None, normal Jaw: None, normal Tongue: None, normal,Extremity Movements Upper (arms, wrists, hands, fingers): None, normal Lower (legs, knees, ankles, toes): None, normal, Trunk Movements Neck, shoulders, hips: None, normal, Overall Severity Severity of abnormal movements (highest score from questions above): None, normal Incapacitation due  to abnormal movements: None, normal Patient's awareness of abnormal movements (rate only patient's report): No Awareness, Dental Status Current problems with teeth and/or dentures?: No Does patient usually wear dentures?: No  CIWA:    COWS:     Musculoskeletal: Strength & Muscle Tone: within normal limits Gait & Station: normal Patient leans: N/A  Psychiatric Specialty Exam: Physical Exam  Nursing note and vitals reviewed.   Review of Systems  Psychiatric/Behavioral: Positive for depression and suicidal ideas. The patient has insomnia.   All other systems reviewed and are negative.   Blood pressure 114/73, pulse 81, temperature 99.3 F (37.4 C), temperature source Oral, resp. rate 18, height 5' 7.5" (1.715 m), weight 91.2 kg (201 lb), last menstrual period 10/24/2016, SpO2 100 %.Body mass index is 31.02 kg/m.  General Appearance: Casual  Eye Contact:  Minimal  Speech:  Clear and Coherent  Volume:  Decreased  Mood:  Depressed, Hopeless and Worthless  Affect:  Flat  Thought Process:  Goal Directed and Descriptions of Associations: Intact  Orientation:  Full (Time, Place, and Person)  Thought Content:  WDL  Suicidal Thoughts:  Yes.  with intent/plan  Homicidal Thoughts:  No  Memory:  Immediate;   Fair Recent;   Fair Remote;   Fair  Judgement:  Impaired  Insight:  Shallow  Psychomotor Activity:  Psychomotor Retardation  Concentration:  Concentration: Fair and Attention Span: Fair  Recall:  [employer] of  Knowledge:  Fair  Language:  Fair  Akathisia:  No  Handed:  Right  AIMS (if indicated):     Assets:  Communication Skills Desire for Improvement Financial Resources/Insurance Housing Resilience Social Support Transportation Vocational/Educational  ADL's:  Intact  Cognition:  WNL  Sleep:  Number of Hours: 6     Treatment Plan Summary: Daily contact with patient to assess and evaluate symptoms and progress in treatment and Medication management   Renee Turner is a 21 year old female with a history of depression and diabetes admitted for worsening of depression and suicidal ideation in the context of medication noncompliance.  1. Suicidal ideation. The patient is able to contract for safety in the hospital.  2. Mood. The patient was restarted on the Zoloft and Wellbutrin for depression. She was started on Lamictal at [facility] emergency room. I will talk to Dr. Weber Cooks about ECT consult.  3. Diabetes. The patient suffers type 2 diabetes she has been maintained on metformin, Bydureon and Neurontin. She is on an ADA diet with blood glucose monitoring. Sliding scale insulin available.  4. Insomnia. Trazodone is available.   5. . Appetite. We will start Glucerna.  6. Disposition. She will return home. She will follow up with her psychiatrist at [facility] and her endocrinologist at [facility].   Orson Slick, MD 11/09/2016, 6:36 PM

## 2016-11-10 LAB — GLUCOSE, CAPILLARY
GLUCOSE-CAPILLARY: 150 mg/dL — AB (ref 65–99)
GLUCOSE-CAPILLARY: 119 mg/dL — AB (ref 65–99)
Glucose-Capillary: 117 mg/dL — ABNORMAL HIGH (ref 65–99)
Glucose-Capillary: 104 mg/dL — ABNORMAL HIGH (ref 65–99)

## 2016-11-10 NOTE — Progress Notes (Signed)
Affect flat.  Denies SI/HI/AVH.  Delayed responses.  Slow in movement. Isolates to room.  No interaction with peers of staff.  Answers yes and no questions only.  Appetite fair.  Medication and group compliant.

## 2016-11-10 NOTE — Progress Notes (Signed)
D: Pt denies SI/HI/AVH. Pt is pleasant and cooperative, affect is flat and sad. Pt appears less anxious and she is interacting with peers and staff appropriately.  A: Pt was offered support and encouragement. Pt was given scheduled medications. Pt was encouraged to attend groups. Q 15 minute checks were done for safety.  R:Pt did not attend group.Pt is taking medication. Pt has no complaints.Pt receptive to treatment and safety maintained on unit.

## 2016-11-10 NOTE — Plan of Care (Signed)
Problem: Self-Concept: Goal: Ability to verbalize positive feelings about self will improve Outcome: Not Progressing Not forthcoming with information.  Answers yes and no questions

## 2016-11-10 NOTE — [facility] Group Notes (Signed)
Jan 6th 2018 100 PM  Type of Therapy:  Group Therapy  Participation Level:  Active  Participation Quality:  Attentive  Affect:  Appropriate  Cognitive:  Appropriate  Insight:  Engaged  Engagement in Therapy:  Developing/Improving  Modes of Intervention:  Activity, Clarification, Discussion, Education, Exploration and Socialization   The purpose of this group is to assist patients in learning to regulate negative emotions and experience positive emotions.  Patients will be guided to discuss ways in which they have been vulnerable to their negative emotions.  These vulnerabilities will be juxtaposed with experiences of positive emotions or situations, and  patients challenged to use positive emotions to combat negative ones.  Special emphasis will be placed on coping with negative emotions in conflict situations,  and patients will process healthy conflict resolution skills.    Therapeutic Goals:   1. Patient will identify two positive emotions or experiences to reflect on in order to balance out negative emotions:    2. Patient will label two or more emotions that they find the most difficult to experience:    3. Patient will be able to demonstrate positive conflict resolution skills through discussion or role plays:      Summary of Progress/Problems: The topic for today was Emotional Regulation Peers were asked to reflect on 2 incidents when they lives were unbalanced.  With facilitation from  group Leader those situations were reflected by patient and their peers.   Pt discussed coping skills that can be used for regulating emotions This patient was able to be supportive and this patients feelings was supported by this worker and peers Patient were then to review ways to improve thier emotions and to share options that assisted them in reducing thier own emotional dysregulation. This patient does not trust others easily and because of that cant really reflect her  emotions. Below are some ideas patient was receptive to. ( taking medications, keep doctors appointmentts, create a daily routine,excercize, volunteer, call friend/family and several stress reduction techniques were reviewed.)

## 2016-11-10 NOTE — Plan of Care (Signed)
Problem: Coping: Goal: Ability to cope will improve Outcome: Progressing Patient able to cope on the unit at this time with no outbursts.patient quiet and reserved at this time.[place] RN

## 2016-11-10 NOTE — Progress Notes (Addendum)
Patient ID: Kishia Shackett, female   DOB: 1996-01-14, 21 y.o.   MRN: [id number]                                                  [facility] MD progress note  11/10/2016 1:29 PM Sabrinia Prien  MRN:  [id number]  Subjective:    Ms. Crutcher is a 21 year old female with a history of severe depression admitted for worsening of her symptoms and suicidal ideation in the context of treatment noncompliance.  11/10/2016 Ms. Melone appears very slow, with decreased PMA, endorsing  depression. There is psychomotor retardation and poor eye contact, latency. She has not been able to answer any questions at any length. There is a great latency of response. She was restarted on Zoloft with Wellbutrin and seems to tolerate it well. She does not interact with peers or staff and states to her room, continues to be isolative.  appetite is  poor.  consider ECT consult if no improvement by Monday.   Per nursing: D: Patient appears blank has minimal interaction. Denies SI/HI/AVH. Patient has been isolative to her room.  A: Medication given with education. Encouragement provided.   R: Patient was compliant with medication. She has remained calm and cooperative. Safety maintained with 15 min checks.   Principal Problem: Major depressive disorder, single episode, severe without psychotic features ([place]) Diagnosis:       Patient Active Problem List   Diagnosis Date Noted  . Major depressive disorder, single episode, severe without psychotic features ([place]) [F32.2] 11/07/2016  . Diabetes ([place]) [E11.9] 11/07/2016   Total Time spent with patient: 20 minutes  Past Psychiatric History: depression.  Past Medical History:      Past Medical History:  Diagnosis Date  . Anxiety   . Depression   . Diabetes mellitus without complication ([place])    Type II   History reviewed. No pertinent surgical history. Family History: History reviewed. No pertinent family history. Family Psychiatric  History: None reported. Social History:      History  Alcohol Use No        History  Drug Use No    Social History        Social History  . Marital status: Single    Spouse name: N/A  . Number of children: N/A  . Years of education: N/A       Social History Main Topics  . Smoking status: Never Smoker  . Smokeless tobacco: Never Used  . Alcohol use No  . Drug use: No  . Sexual activity: No       Other Topics Concern  . None      Social History Narrative  . None   Additional Social History:    History of alcohol / drug use?: No history of alcohol / drug abuse                    Sleep: Fair  Appetite:  Fair  Current Medications:          Current Facility-Administered Medications  Medication Dose Route Frequency Provider Last Rate Last Dose  . acetaminophen (TYLENOL) tablet 650 mg  650 mg Oral Q6H PRN Patrecia Pour, NP   650 mg at 11/08/16 1951  . alum & mag hydroxide-simeth (MAALOX/MYLANTA) 200-200-20 MG/5ML suspension 30 mL  30 mL Oral Q4H PRN Asa Saunas  Lord, NP      . buPROPion (WELLBUTRIN XL) 24 hr tablet 150 mg  150 mg Oral Daily Clovis Fredrickson, MD   150 mg at 11/09/16 0828  . feeding supplement (GLUCERNA SHAKE) (GLUCERNA SHAKE) liquid 237 mL  237 mL Oral TID BM Jolanta B Pucilowska, MD      . gabapentin (NEURONTIN) capsule 300 mg  300 mg Oral TID Clovis Fredrickson, MD   300 mg at 11/09/16 1626  . Influenza vac split quadrivalent PF (FLUARIX) injection 0.5 mL  0.5 mL Intramuscular Tomorrow-1000 Jolanta B Pucilowska, MD      . insulin aspart (novoLOG) injection 0-5 Units  0-5 Units Subcutaneous QHS Patrecia Pour, NP      . insulin aspart (novoLOG) injection 0-9 Units  0-9 Units Subcutaneous TID WC Patrecia Pour, NP   Stopped at 11/08/16 0800  . lamoTRIgine (LAMICTAL) tablet 25 mg  25 mg Oral Daily Patrecia Pour, NP   25 mg at 11/09/16 5916  . magnesium hydroxide (MILK OF MAGNESIA) suspension 30 mL  30 mL Oral Daily PRN Patrecia Pour, NP      . metFORMIN (GLUCOPHAGE-XR) 24  hr tablet 1,000 mg  1,000 mg Oral BID WC Patrecia Pour, NP   1,000 mg at 11/09/16 1626  . NON FORMULARY 2 mg  2 mg Subcutaneous Weekly Jolanta B Pucilowska, MD      . pneumococcal 23 valent vaccine (PNU-IMMUNE) injection 0.5 mL  0.5 mL Intramuscular Tomorrow-1000 Jolanta B Pucilowska, MD      . sertraline (ZOLOFT) tablet 50 mg  50 mg Oral QHS Clovis Fredrickson, MD   50 mg at 11/08/16 2117    Lab Results:  Lab Results Last 48 Hours       Results for orders placed or performed during the hospital encounter of 11/07/16 (from the past 48 hour(s))  Glucose, capillary     Status: Abnormal   Collection Time: 11/07/16 10:09 PM  Result Value Ref Range   Glucose-Capillary 116 (H) 65 - 99 mg/dL  Glucose, capillary     Status: Abnormal   Collection Time: 11/08/16  5:13 AM  Result Value Ref Range   Glucose-Capillary 117 (H) 65 - 99 mg/dL  Glucose, capillary     Status: Abnormal   Collection Time: 11/08/16 11:58 AM  Result Value Ref Range   Glucose-Capillary 118 (H) 65 - 99 mg/dL   Comment 1 Notify RN   Glucose, capillary     Status: Abnormal   Collection Time: 11/08/16  4:25 PM  Result Value Ref Range   Glucose-Capillary 118 (H) 65 - 99 mg/dL  Glucose, capillary     Status: Abnormal   Collection Time: 11/08/16  7:56 PM  Result Value Ref Range   Glucose-Capillary 110 (H) 65 - 99 mg/dL  Glucose, capillary     Status: Abnormal   Collection Time: 11/09/16  6:35 AM  Result Value Ref Range   Glucose-Capillary 119 (H) 65 - 99 mg/dL  Glucose, capillary     Status: Abnormal   Collection Time: 11/09/16 11:57 AM  Result Value Ref Range   Glucose-Capillary 113 (H) 65 - 99 mg/dL  Glucose, capillary     Status: None   Collection Time: 11/09/16  4:26 PM  Result Value Ref Range   Glucose-Capillary 86 65 - 99 mg/dL   Comment 1 Notify RN       Blood Alcohol level:  Recent Labs       Lab Results  Component Value Date   ETH <5 66/59/9357      Metabolic Disorder  Labs: Recent Labs  No results found for: HGBA1C, MPG   Recent Labs  No results found for: PROLACTIN   Recent Labs  No results found for: CHOL, TRIG, HDL, CHOLHDL, VLDL, LDLCALC    Physical Findings: AIMS: Facial and Oral Movements Muscles of Facial Expression: None, normal Lips and Perioral Area: None, normal Jaw: None, normal Tongue: None, normal,Extremity Movements Upper (arms, wrists, hands, fingers): None, normal Lower (legs, knees, ankles, toes): None, normal, Trunk Movements Neck, shoulders, hips: None, normal, Overall Severity Severity of abnormal movements (highest score from questions above): None, normal Incapacitation due to abnormal movements: None, normal Patient's awareness of abnormal movements (rate only patient's report): No Awareness, Dental Status Current problems with teeth and/or dentures?: No Does patient usually wear dentures?: No  CIWA:    COWS:     Musculoskeletal: Strength & Muscle Tone: within normal limits Gait & Station: normal Patient leans: N/A  Psychiatric Specialty Exam: Physical Exam  Nursing note and vitals reviewed.   Review of Systems  Psychiatric/Behavioral: Positive for depression and suicidal ideas. The patient has insomnia.   All other systems reviewed and are negative.   Blood pressure 114/73, pulse 81, temperature 99.3 F (37.4 C), temperature source Oral, resp. rate 18, height 5' 7.5" (1.715 m), weight 91.2 kg (201 lb), last menstrual period 10/24/2016, SpO2 100 %.Body mass index is 31.02 kg/m.  General Appearance: Casual, decreased PMA  Eye Contact:  Minimal  Speech:  slow with latency, Clear and Coherent  Volume:  Decreased  Mood:  Depressed, Hopeless and Worthless  Affect:  Flat  Thought Process:  Goal Directed and Descriptions of Associations: Intact  Orientation:  Full (Time, Place, and Person)  Thought Content:  WDL  Suicidal Thoughts:  denies  Homicidal Thoughts:  No  Memory:  Immediate;   Fair Recent;    Fair Remote;   Fair  Judgement:  Impaired  Insight:  Shallow  Psychomotor Activity:  Psychomotor Retardation  Concentration:  Concentration: Fair and Attention Span: Fair  Recall:  [employer] of Knowledge:  Fair  Language:  Fair  Akathisia:  No  Handed:  Right  AIMS (if indicated):     Assets:  Communication Skills Desire for Improvement Financial Resources/Insurance Housing Resilience Social Support Transportation Vocational/Educational  ADL's:  Intact  Cognition:  WNL  Sleep:  Number of Hours:      Treatment Plan Summary: Daily contact with patient to assess and evaluate symptoms and progress in treatment and Medication management   Ms. Swayne is a 21 year old female with a history of depression and diabetes admitted for worsening of depression and suicidal ideation in the context of medication noncompliance.  1. Suicidal ideation. The patient is able to contract for safety in the hospital.  2. Mood. The patient was restarted on the Zoloft and Wellbutrin for depression. She was started on Lamictal at [facility] emergency room. I will talk to Dr. Weber Cooks about ECT consult.  3. Diabetes. The patient suffers type 2 diabetes she has been maintained on metformin, Bydureon and Neurontin. She is on an ADA diet with blood glucose monitoring. Sliding scale insulin available.  4. Insomnia. Trazodone is available.   5. . Appetite. We will start Glucerna.  6. Disposition. She will return home. She will follow up with her psychiatrist at [facility] and her endocrinologist at [facility].

## 2016-11-10 NOTE — Progress Notes (Signed)
D: Patient is alert and oriented on the unit this shift. Patient not  attended and actively participated in groups today. Patient denies suicidal ideation, homicidal ideation, auditory or visual hallucinations at the present time.  A: Scheduled medications are administered to patient as per MD orders. Emotional support and encouragement are provided. Patient is maintained on q.15 minute safety checks. Patient is informed to notify staff with questions or concerns. R: No adverse medication reactions are noted. Patient is cooperative with medication administration   Patient is receptive,slow ,sad,flat,quiet calm and cooperative on the unit at this time. Patient does not  Interact with others on the unit this shift. Patient contracts for safety at this time. Patient remains safe at this time. Depression 8/10 Anxiety 6/10

## 2016-11-11 LAB — GLUCOSE, CAPILLARY
GLUCOSE-CAPILLARY: 92 mg/dL (ref 65–99)
Glucose-Capillary: 122 mg/dL — ABNORMAL HIGH (ref 65–99)
Glucose-Capillary: 147 mg/dL — ABNORMAL HIGH (ref 65–99)
Glucose-Capillary: 157 mg/dL — ABNORMAL HIGH (ref 65–99)

## 2016-11-11 MED ORDER — LORATADINE 10 MG PO TABS
10.0000 mg | ORAL_TABLET | Freq: Every day | ORAL | Status: DC
  Administered 2016-11-11 – 2016-11-19 (×9): 10 mg via ORAL
  Filled 2016-11-11 (×9): qty 1

## 2016-11-11 NOTE — [facility] Group Notes (Signed)
[facility] LCSW Group Therapy  11/11/2016 2:35 PM  Type of Therapy:  Group Therapy Balance in Life  Participation Level:  Active  Participation Quality:  Appropriate  Affect:  Appropriate  Cognitive:  Alert and Oriented  Insight:  Developing/Improving  Engagement in Therapy:  Engaged  Modes of Intervention:  Activity, Clarification, Discussion, Education, Problem-solving and Rapport Building  Summary of Progress  The topic for group was balance in life. Today's group focused on defining balance in one's own words, identifying things that can knock one off balance, and exploring healthy ways to maintain balance in life. Group members were asked to provide an example of a time when they felt off balance, describe how they handled that situation, and process healthier ways to regain balance in the future. Group members were asked to share the most important tool for maintaining balance that they learned [facility] and how they plan to apply this method after discharge. This patient was able to express thoughts and feelings and support their peers. She started the group reporting she does not like to speak in groups and is shy. By the end of group she was to speak freely and shared her dreams and career aspirations. 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    Freeman Borba M

## 2016-11-11 NOTE — [facility] Group Notes (Signed)
[facility] Group Notes:  (Nursing/MHT/Case Management/Adjunct)  Date:  11/11/2016  Time:  5:03 AM  Type of Therapy:  Psychoeducational Skills  Participation Level:  Active  Participation Quality:  Appropriate  Affect:  Appropriate  Cognitive:  Appropriate  Insight:  Appropriate  Engagement in Group:  Engaged  Modes of Intervention:  Discussion, Socialization and Support  Summary of Progress/Problems:  Renee MilroyLaquanda Y Jerrick Turner 11/11/2016, 5:03 AM

## 2016-11-11 NOTE — Plan of Care (Signed)
Problem: Self-Concept: Goal: Ability to disclose and discuss suicidal ideas will improve Outcome: Progressing Pt continues to deny suicidal thoughts. Forwards little otherwise.

## 2016-11-11 NOTE — Progress Notes (Signed)
Pt awake and alert this morning. Stays isolated to room in her bed reading a book. Comes out for meals/meds. Minimal interaction observed. Medication compliant. Appears flat, depressed, slow. Denies SI/HI/AVH. Continues to forward little. Calm and cooperative.   Support and encouragement provided with use of therapeutic communication. Medications administered as ordered with education. Safety maintained with every 15 minute checks. Will continue to monitor.

## 2016-11-11 NOTE — Progress Notes (Signed)
Pt observed in dayroom interacting appropriately with peers, playing a game. Noted to be smiling with interaction. Safety maintained. Will continue to monitor.

## 2016-11-11 NOTE — Progress Notes (Addendum)
Patient ID: Renee Turner, female   DOB: 1996-06-15, 21 y.o.   MRN: [id number]                                                  [facility] MD progress note  11/11/2016 12:48 PM Renee Turner  MRN:  [id number]  Subjective:    Renee Turner is a 21 year old female with a history of severe depression admitted for worsening of her symptoms and suicidal ideation in the context of treatment noncompliance.  11/11/2016  Pt reporting cold symptoms, pt still  very slow, with decreased PMA, endorsing  depression. There is psychomotor retardation and poor eye contact, latency. She has not been able to answer any questions at any length. There is a great latency of response. She was restarted on Zoloft with Wellbutrin and seems to tolerate it well. She does not interact with peers or staff and states to her room, continues to be isolative.  appetite is  poor.  consider ECT consult if no improvement by Monday.   Per nursing: D: Patient appears blank has minimal interaction. Denies SI/HI/AVH. Patient has been isolative to her room.  A: Medication given with education. Encouragement provided.   R: Patient was compliant with medication. She has remained calm and cooperative. Safety maintained with 15 min checks.   Principal Problem: Major depressive disorder, single episode, severe without psychotic features ([place]) Diagnosis:       Patient Active Problem List   Diagnosis Date Noted  . Major depressive disorder, single episode, severe without psychotic features ([place]) [F32.2] 11/07/2016  . Diabetes ([place]) [E11.9] 11/07/2016   Total Time spent with patient: 20 minutes  Past Psychiatric History: depression.  Past Medical History:      Past Medical History:  Diagnosis Date  . Anxiety   . Depression   . Diabetes mellitus without complication ([place])    Type II   History reviewed. No pertinent surgical history. Family History: History reviewed. No pertinent family history. Family Psychiatric  History: None  reported. Social History:     History  Alcohol Use No        History  Drug Use No    Social History        Social History  . Marital status: Single    Spouse name: N/A  . Number of children: N/A  . Years of education: N/A       Social History Main Topics  . Smoking status: Never Smoker  . Smokeless tobacco: Never Used  . Alcohol use No  . Drug use: No  . Sexual activity: No       Other Topics Concern  . None      Social History Narrative  . None   Additional Social History:    History of alcohol / drug use?: No history of alcohol / drug abuse                    Sleep: Fair  Appetite:  Fair  Current Medications:          Current Facility-Administered Medications  Medication Dose Route Frequency Provider Last Rate Last Dose  . acetaminophen (TYLENOL) tablet 650 mg  650 mg Oral Q6H PRN Patrecia Pour, NP   650 mg at 11/08/16 1951  . alum & mag hydroxide-simeth (MAALOX/MYLANTA) 200-200-20 MG/5ML suspension 30 mL  30 mL  Oral Q4H PRN Patrecia Pour, NP      . buPROPion (WELLBUTRIN XL) 24 hr tablet 150 mg  150 mg Oral Daily Clovis Fredrickson, MD   150 mg at 11/09/16 0828  . feeding supplement (GLUCERNA SHAKE) (GLUCERNA SHAKE) liquid 237 mL  237 mL Oral TID BM Jolanta B Pucilowska, MD      . gabapentin (NEURONTIN) capsule 300 mg  300 mg Oral TID Clovis Fredrickson, MD   300 mg at 11/09/16 1626  . Influenza vac split quadrivalent PF (FLUARIX) injection 0.5 mL  0.5 mL Intramuscular Tomorrow-1000 Jolanta B Pucilowska, MD      . insulin aspart (novoLOG) injection 0-5 Units  0-5 Units Subcutaneous QHS Patrecia Pour, NP      . insulin aspart (novoLOG) injection 0-9 Units  0-9 Units Subcutaneous TID WC Patrecia Pour, NP   Stopped at 11/08/16 0800  . lamoTRIgine (LAMICTAL) tablet 25 mg  25 mg Oral Daily Patrecia Pour, NP   25 mg at 11/09/16 5427  . magnesium hydroxide (MILK OF MAGNESIA) suspension 30 mL  30 mL Oral Daily PRN Patrecia Pour, NP       . metFORMIN (GLUCOPHAGE-XR) 24 hr tablet 1,000 mg  1,000 mg Oral BID WC Patrecia Pour, NP   1,000 mg at 11/09/16 1626  . NON FORMULARY 2 mg  2 mg Subcutaneous Weekly Jolanta B Pucilowska, MD      . pneumococcal 23 valent vaccine (PNU-IMMUNE) injection 0.5 mL  0.5 mL Intramuscular Tomorrow-1000 Jolanta B Pucilowska, MD      . sertraline (ZOLOFT) tablet 50 mg  50 mg Oral QHS Clovis Fredrickson, MD   50 mg at 11/08/16 2117    Lab Results:  Lab Results Last 48 Hours       Results for orders placed or performed during the hospital encounter of 11/07/16 (from the past 48 hour(s))  Glucose, capillary     Status: Abnormal   Collection Time: 11/07/16 10:09 PM  Result Value Ref Range   Glucose-Capillary 116 (H) 65 - 99 mg/dL  Glucose, capillary     Status: Abnormal   Collection Time: 11/08/16  5:13 AM  Result Value Ref Range   Glucose-Capillary 117 (H) 65 - 99 mg/dL  Glucose, capillary     Status: Abnormal   Collection Time: 11/08/16 11:58 AM  Result Value Ref Range   Glucose-Capillary 118 (H) 65 - 99 mg/dL   Comment 1 Notify RN   Glucose, capillary     Status: Abnormal   Collection Time: 11/08/16  4:25 PM  Result Value Ref Range   Glucose-Capillary 118 (H) 65 - 99 mg/dL  Glucose, capillary     Status: Abnormal   Collection Time: 11/08/16  7:56 PM  Result Value Ref Range   Glucose-Capillary 110 (H) 65 - 99 mg/dL  Glucose, capillary     Status: Abnormal   Collection Time: 11/09/16  6:35 AM  Result Value Ref Range   Glucose-Capillary 119 (H) 65 - 99 mg/dL  Glucose, capillary     Status: Abnormal   Collection Time: 11/09/16 11:57 AM  Result Value Ref Range   Glucose-Capillary 113 (H) 65 - 99 mg/dL  Glucose, capillary     Status: None   Collection Time: 11/09/16  4:26 PM  Result Value Ref Range   Glucose-Capillary 86 65 - 99 mg/dL   Comment 1 Notify RN       Blood Alcohol level:  Recent Labs  Lab Results  Component Value Date   ETH <5  16/08/9603      Metabolic Disorder Labs: Recent Labs  No results found for: HGBA1C, MPG   Recent Labs  No results found for: PROLACTIN   Recent Labs  No results found for: CHOL, TRIG, HDL, CHOLHDL, VLDL, LDLCALC    Physical Findings: AIMS: Facial and Oral Movements Muscles of Facial Expression: None, normal Lips and Perioral Area: None, normal Jaw: None, normal Tongue: None, normal,Extremity Movements Upper (arms, wrists, hands, fingers): None, normal Lower (legs, knees, ankles, toes): None, normal, Trunk Movements Neck, shoulders, hips: None, normal, Overall Severity Severity of abnormal movements (highest score from questions above): None, normal Incapacitation due to abnormal movements: None, normal Patient's awareness of abnormal movements (rate only patient's report): No Awareness, Dental Status Current problems with teeth and/or dentures?: No Does patient usually wear dentures?: No  CIWA:    COWS:     Musculoskeletal: Strength & Muscle Tone: within normal limits Gait & Station: normal Patient leans: N/A  Psychiatric Specialty Exam: Physical Exam  Nursing note and vitals reviewed.   Review of Systems  Psychiatric/Behavioral: Positive for depression , decrease PMA. All other systems reviewed and are negative.   Blood pressure 114/73, pulse 81, temperature 99.3 F (37.4 C), temperature source Oral, resp. rate 18, height 5' 7.5" (1.715 m), weight 91.2 kg (201 lb), last menstrual period 10/24/2016, SpO2 100 %.Body mass index is 31.02 kg/m.  General Appearance: Casual, decreased PMA  Eye Contact:  Minimal  Speech:  slow with latency, Clear and Coherent  Volume:  Decreased  Mood:  Depressed, Hopeless and Worthless  Affect:  Flat  Thought Process:  Goal Directed and Descriptions of Associations: Intact  Orientation:  Full (Time, Place, and Person)  Thought Content:  WDL  Suicidal Thoughts:  denies  Homicidal Thoughts:  No  Memory:  Immediate;    Fair Recent;   Fair Remote;   Fair  Judgement:  Impaired  Insight:  Shallow  Psychomotor Activity:  Psychomotor Retardation  Concentration:  Concentration: Fair and Attention Span: Fair  Recall:  [employer] of Knowledge:  Fair  Language:  Fair  Akathisia:  No  Handed:  Right  AIMS (if indicated):     Assets:  Communication Skills Desire for Improvement Financial Resources/Insurance Housing Resilience Social Support Transportation Vocational/Educational  ADL's:  Intact  Cognition:  WNL  Sleep:  Number of Hours:      Treatment Plan Summary: Daily contact with patient to assess and evaluate symptoms and progress in treatment and Medication management  will add Claritin for cold symptoms.  Renee Turner is a 21 year old female with a history of depression and diabetes admitted for worsening of depression and suicidal ideation in the context of medication noncompliance.  1. Suicidal ideation. The patient is able to contract for safety in the hospital.  2. Mood. The patient was restarted on the Zoloft and Wellbutrin for depression. She was started on Lamictal at [facility] emergency room. Plan  ECT .  3. Diabetes. The patient suffers type 2 diabetes she has been maintained on metformin, Bydureon and Neurontin. She is on an ADA diet with blood glucose monitoring. Sliding scale insulin available.  4. Insomnia. Trazodone is available.   5. . Appetite. We will start Glucerna.  6. Disposition. She will return home. She will follow up with her psychiatrist at [facility] and her endocrinologist at [facility].   Patient ID: Renee Turner, female   DOB: [birth year], 21 y.o.   MRN:  [id number]  

## 2016-11-12 LAB — GLUCOSE, CAPILLARY
GLUCOSE-CAPILLARY: 133 mg/dL — AB (ref 65–99)
GLUCOSE-CAPILLARY: 150 mg/dL — AB (ref 65–99)
Glucose-Capillary: 124 mg/dL — ABNORMAL HIGH (ref 65–99)
Glucose-Capillary: 173 mg/dL — ABNORMAL HIGH (ref 65–99)

## 2016-11-12 MED ORDER — SERTRALINE HCL 100 MG PO TABS
100.0000 mg | ORAL_TABLET | Freq: Every day | ORAL | Status: DC
  Administered 2016-11-12 – 2016-11-18 (×7): 100 mg via ORAL
  Filled 2016-11-12 (×7): qty 1

## 2016-11-12 NOTE — Progress Notes (Signed)
Recreation Therapy Notes  Date: 01.08.18 Time: 9:30 am Location: Craft Room  Group Topic: Self-expression  Goal Area(s) Addresses:  Patient will effectively use as a means of self-expression. Patient will recognize positive benefit of self-expression. Patient will be able to identify one emotion experienced during group session. Patient will identify use of art as a coping skill.  Behavioral Response: Attentive, Interactive  Intervention: Two Faces of Me  Activity: Patients were given a blank face worksheet and were instructed to draw a line down the middle. On one side of the worksheet, patients were instructed to draw or write how they felt when they were admitted to the hospital. On the other side, patients were instructed to draw or write how they want to feel when they are d/c.  Education: LRT educated patients on other forms of self-expression.  Education Outcome: Acknowledges education/In group clarification offered  Clinical Observations/Feedback: Patient drew and wrote how she felt when she was admitted to the hospital and how she wanted to feel when she is d/c. Patient contributed to group discussion by stating how she is going to maintain being positive and happy post d/c.  Jacquelynn CreeGreene,Nayab Aten M, LRT/CTRS 11/12/2016 10:26 AM

## 2016-11-12 NOTE — Progress Notes (Signed)
D: Pt denies SI/HI/AVH. Pt is pleasant and cooperative. Pt minimizes her interaction, but will respond to questions. Pt seen talking with peers in the dayroom this evening.   A: Pt was offered support and encouragement. Pt was given scheduled medications. Pt was encourage to attend groups. Q 15 minute checks were done for safety.   R: Pt is taking medication. Pt has no complaints.Pt receptive to treatment and safety maintained on unit.

## 2016-11-12 NOTE — Progress Notes (Signed)
Patient isolated in her room and minimal interaction with interaction with staff & peers.Rated her depression & anxiety 4/10.Stated that she feels little better.Denies suicidal or homicidal ideations and AV hallucinations.Com[pliant with medications.Attended groups.Appetite & energy level good.Support & encouragement given.

## 2016-11-12 NOTE — Plan of Care (Signed)
Problem: Coping: Goal: Ability to cope will improve Outcome: Progressing Pt denied SI at this time
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
## 2016-11-12 NOTE — Progress Notes (Signed)
D: Pt denies SI/HI/AVH. Pt is pleasant and cooperative. Pt seen in the dayroom playing Monopoly with peers. Pt verbalized her desire to get back in school and to move forward with her life. Pt more verbal to staff and had a brighter affect this evening.   A: Pt was offered support and encouragement. Pt was given scheduled medications. Pt was encourage to attend groups. Q 15 minute checks were done for safety.   R:Pt attends groups and interacts well with peers and staff. Pt is taking medication. Pt has no complaints.Pt receptive to treatment and safety maintained on unit.

## 2016-11-12 NOTE — [facility] Group Notes (Signed)
[facility] Group Notes:  (Nursing/MHT/Case Management/Adjunct)  Date:  11/12/2016  Time:  9:31 PM  Type of Therapy:  Psychoeducational Skills  Participation Level:  Minimal  Participation Quality:  Appropriate  Affect:  Appropriate  Cognitive:  Appropriate  Insight:  Good  Engagement in Group:  Supportive  Modes of Intervention:  Activity  Summary of Progress/Problems:  Mayra NeerJackie L Mabell Esguerra 11/12/2016, 9:31 PM

## 2016-11-13 DIAGNOSIS — F4310 Post-traumatic stress disorder, unspecified: Secondary | ICD-10-CM

## 2016-11-13 LAB — GLUCOSE, CAPILLARY
GLUCOSE-CAPILLARY: 135 mg/dL — AB (ref 65–99)
Glucose-Capillary: 116 mg/dL — ABNORMAL HIGH (ref 65–99)
Glucose-Capillary: 152 mg/dL — ABNORMAL HIGH (ref 65–99)
Glucose-Capillary: 133 mg/dL — ABNORMAL HIGH (ref 65–99)

## 2016-11-13 MED ORDER — LAMOTRIGINE 25 MG PO TABS
50.0000 mg | ORAL_TABLET | Freq: Every day | ORAL | Status: DC
  Administered 2016-11-14: 50 mg via ORAL
  Filled 2016-11-13: qty 2

## 2016-11-13 NOTE — Progress Notes (Signed)
Patient is more visible in the milieu.More interaction with peers & staff.Patient is calm & affect is brighter on approach.Denies suicidal or homicidal ideations.Compliant with medications.Attendedg roups.Support & encouragement given.Patient is receptive with the therapeutic communication.

## 2016-11-13 NOTE — Tx Team (Signed)
Interdisciplinary Treatment and Diagnostic Plan Update  11/13/2016 Time of Session: 1055 Renee Turner MRN: [id number]  Principal Diagnosis: Major depressive disorder, single episode, severe without psychotic features ([place])  Secondary Diagnoses: Principal Problem:   Major depressive disorder, single episode, severe without psychotic features ([place]) Active Problems:   Diabetes ([place])   Current Medications:  Current Facility-Administered Medications  Medication Dose Route Frequency Provider Last Rate Last Dose  . acetaminophen (TYLENOL) tablet 650 mg  650 mg Oral Q6H PRN Patrecia Pour, NP   650 mg at 11/08/16 1951  . alum & mag hydroxide-simeth (MAALOX/MYLANTA) 200-200-20 MG/5ML suspension 30 mL  30 mL Oral Q4H PRN Patrecia Pour, NP      . buPROPion (WELLBUTRIN XL) 24 hr tablet 150 mg  150 mg Oral Daily Clovis Fredrickson, MD   150 mg at 11/13/16 0842  . feeding supplement (GLUCERNA SHAKE) (GLUCERNA SHAKE) liquid 237 mL  237 mL Oral TID BM Jolanta B Pucilowska, MD   237 mL at 11/12/16 1400  . gabapentin (NEURONTIN) capsule 300 mg  300 mg Oral TID Clovis Fredrickson, MD   300 mg at 11/13/16 0842  . insulin aspart (novoLOG) injection 0-5 Units  0-5 Units Subcutaneous QHS Patrecia Pour, NP      . insulin aspart (novoLOG) injection 0-9 Units  0-9 Units Subcutaneous TID WC Patrecia Pour, NP   1 Units at 11/12/16 1636  . lamoTRIgine (LAMICTAL) tablet 25 mg  25 mg Oral Daily Patrecia Pour, NP   25 mg at 11/13/16 0842  . loratadine (CLARITIN) tablet 10 mg  10 mg Oral Daily Lenward Chancellor, MD   10 mg at 11/13/16 0842  . magnesium hydroxide (MILK OF MAGNESIA) suspension 30 mL  30 mL Oral Daily PRN Patrecia Pour, NP      . metFORMIN (GLUCOPHAGE-XR) 24 hr tablet 1,000 mg  1,000 mg Oral BID WC Patrecia Pour, NP   1,000 mg at 11/13/16 0842  . NON FORMULARY 2 mg  2 mg Subcutaneous Weekly Jolanta B Pucilowska, MD      . sertraline (ZOLOFT) tablet 100 mg  100 mg Oral QHS Jolanta B Pucilowska, MD    100 mg at 11/12/16 2235   PTA Medications: Prescriptions Prior to Admission  Medication Sig Dispense Refill Last Dose  . Exenatide ER (BYDUREON) 2 MG PEN Inject 2 mg into the skin once a week.   Past Month at Unknown time  . FARXIGA 10 MG TABS tablet Take 1 tablet by mouth daily.  2 Past Week at Unknown time  . gabapentin (NEURONTIN) 300 MG capsule Take 1 capsule by mouth 3 (three) times daily.  2 Past Week at Unknown time  . metFORMIN (GLUCOPHAGE-XR) 500 MG 24 hr tablet Take 1,000 mg by mouth 2 (two) times daily.  3 11/06/2016 at Unknown time    Patient Stressors: Marital or family conflict Medication change or noncompliance Traumatic event  Patient Strengths: Ability for insight Average or above average intelligence General fund of knowledge Motivation for treatment/growth Physical Health Supportive family/friends Work skills  Treatment Modalities: Medication Management, Group therapy, Case management,  1 to 1 session with clinician, Psychoeducation, Recreational therapy.   Physician Treatment Plan for Primary Diagnosis: Major depressive disorder, single episode, severe without psychotic features ([place]) Long Term Goal(s): Improvement in symptoms so as ready for discharge NA   Short Term Goals: Ability to identify changes in lifestyle to reduce recurrence of condition will improve Ability to verbalize feelings will improve Ability  to disclose and discuss suicidal ideas Ability to demonstrate self-control will improve Ability to identify and develop effective coping behaviors will improve Ability to maintain clinical measurements within normal limits will improve Compliance with prescribed medications will improve Ability to identify triggers associated with substance abuse/mental health issues will improve NA  Medication Management: Evaluate patient's response, side effects, and tolerance of medication regimen.  Therapeutic Interventions: 1 to 1 sessions, Unit Group sessions  and Medication administration.  Evaluation of Outcomes: Progressing  Physician Treatment Plan for Secondary Diagnosis: Principal Problem:   Major depressive disorder, single episode, severe without psychotic features ([place]) Active Problems:   Diabetes ([place])  Long Term Goal(s): Improvement in symptoms so as ready for discharge NA   Short Term Goals: Ability to identify changes in lifestyle to reduce recurrence of condition will improve Ability to verbalize feelings will improve Ability to disclose and discuss suicidal ideas Ability to demonstrate self-control will improve Ability to identify and develop effective coping behaviors will improve Ability to maintain clinical measurements within normal limits will improve Compliance with prescribed medications will improve Ability to identify triggers associated with substance abuse/mental health issues will improve NA     Medication Management: Evaluate patient's response, side effects, and tolerance of medication regimen.  Therapeutic Interventions: 1 to 1 sessions, Unit Group sessions and Medication administration.  Evaluation of Outcomes: Progressing   RN Treatment Plan for Primary Diagnosis: Major depressive disorder, single episode, severe without psychotic features ([place]) Long Term Goal(s): Knowledge of disease and therapeutic regimen to maintain health will improve  Short Term Goals: Ability to participate in decision making will improve and Ability to verbalize feelings will improve  Medication Management: RN will administer medications as ordered by provider, will assess and evaluate patient's response and provide education to patient for prescribed medication. RN will report any adverse and/or side effects to prescribing provider.  Therapeutic Interventions: 1 on 1 counseling sessions, Psychoeducation, Medication administration, Evaluate responses to treatment, Monitor vital signs and CBGs as ordered, Perform/monitor CIWA,  COWS, AIMS and Fall Risk screenings as ordered, Perform wound care treatments as ordered.  Evaluation of Outcomes: Progressing   LCSW Treatment Plan for Primary Diagnosis: Major depressive disorder, single episode, severe without psychotic features ([place]) Long Term Goal(s): Safe transition to appropriate next level of care at discharge, Engage patient in therapeutic group addressing interpersonal concerns.  Short Term Goals: Engage patient in aftercare planning with referrals and resources, Increase social support, Increase ability to appropriately verbalize feelings and Increase skills for wellness and recovery  Therapeutic Interventions: Assess for all discharge needs, 1 to 1 time with Social worker, Explore available resources and support systems, Assess for adequacy in community support network, Educate family and significant other(s) on suicide prevention, Complete Psychosocial Assessment, Interpersonal group therapy.  Evaluation of Outcomes: Progressing   Progress in Treatment: Attending groups: Yes. Participating in groups: Yes. Taking medication as prescribed: Yes. Toleration medication: Yes. Family/Significant other contact made: Yes, individual(s) contacted:  Theo Dills, friend's mother Patient understands diagnosis: Yes. Discussing patient identified problems/goals with staff: Yes. Medical problems stabilized or resolved: Yes. Denies suicidal/homicidal ideation: Yes. Issues/concerns per patient self-inventory: Yes. Other: none  New problem(s) identified: No, Describe:  none  New Short Term/Long Term Goal(s):  Discharge Plan or Barriers:   Reason for Continuation of Hospitalization: Anxiety Depression  Estimated Length of Stay: 1-3 days  Attendees: Patient:Renee Turner 11/13/2016   Physician: Dr Bary Leriche, MD 11/13/2016   Nursing: Floyde Parkins, RN 11/13/2016   RN Care Manager: 11/13/2016  Social Worker: Lurline Idol, LCSW 11/13/2016   Recreational Therapist: Delle Reining  11/13/2016   Other:  11/13/2016   Other:  11/13/2016   Other: 11/13/2016        Scribe for Treatment Team: Joanne Chars, [place] 11/13/2016 12:18 PM
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
## 2016-11-13 NOTE — [facility] Group Notes (Signed)
Goals Group  Date/Time: 9:00 AM Type of Therapy and Topic: Group Therapy: Goals Group: SMART Goals  ?  Participation Level: Moderate  ?  Description of Group:  ?  The purpose of a daily goals group is to assist and guide patients in setting recovery/wellness-related goals. The objective is to set goals as they relate to the crisis in which they were admitted. Patients will be using SMART goal modalities to set measurable goals. Characteristics of realistic goals will be discussed and patients will be assisted in setting and processing how one will reach their goal. Facilitator will also assist patients in applying interventions and coping skills learned in psycho-education groups to the SMART goal and process how one will achieve defined goal.  ?  Therapeutic Goals:  ?  -Patients will develop and document one goal related to or their crisis in which brought them into treatment.  -Patients will be guided by LCSW using SMART goal setting modality in how to set a measurable, attainable, realistic and time sensitive goal.  -Patients will process barriers in reaching goal.  -Patients will process interventions in how to overcome and successful in reaching goal.  ?  Patient's Goal:  ?  Therapeutic Modalities:  Motivational Interviewing  Cognitive Behavioral Therapy  Crisis Intervention Model  SMART goals setting   Hampton AbbotKadijah Yamilka Lopiccolo, MSW, LCSW-A 11/13/2016, 10:28AM
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
## 2016-11-13 NOTE — Progress Notes (Signed)
Recreation Therapy Notes  Date: 01.09.18 Time: 9:30 am Location: Craft Room  Group Topic: Goal Setting  Goal Area(s) Addresses:  Patient will write at least one goal. Patient will write at least one obstacle.  Behavioral Response: Attentive  Intervention: Recovery Goal Chart  Activity: Patients were instructed to make a Recovery Goal Chart including goals, obstacles, the date they started working on their goals, and the date they achieved their goals.  Education: LRT educated patients on healthy ways they can celebrate reaching their goals.  Education Outcome: Acknowledges education/In group clarification offered  Clinical Observations/Feedback: Patient wrote goals and obstacles. Patient did not contribute to group discussion.  Jacquelynn CreeGreene,Estle Huguley M, LRT/CTRS 11/13/2016 10:22 AM

## 2016-11-13 NOTE — [facility] Group Notes (Signed)
[facility] LCSW Group Therapy   11/13/2016 1pm   Type of Therapy: Group Therapy   Participation Level: Active   Participation Quality: Attentive, Sharing and Supportive   Affect: Appropriate  Cognitive: Alert and Oriented   Insight: Developing/Improving and Engaged   Engagement in Therapy: Developing/Improving and Engaged   Modes of Intervention: Clarification, Confrontation, Discussion, Education, Exploration,  Limit-setting, Orientation, Problem-solving, Rapport Building, Dance movement psychotherapisteality Testing, Socialization and Support  Summary of Progress/Problems: The topic for group therapy was feelings about diagnosis. Pt actively participated in group discussion on their past and current diagnosis and how they feel towards this. Pt also identified how society and family members judge them, based on their diagnosis as well as stereotypes and stigmas. Pt shared that the pt's diagnoses are major depressive disorder and generalized anxiety disordery. Pt shared that the pt feels this diagnosis is correct but reported the pt never thought this was a problem until now.  Pt shared the pt feels positive about the pt's diagnosis in that the pt now can get assistance in treating the pt's diagnosis now that the pt is taking medications and attending group.    Dorothe PeaJonathan F. Ahsan Esterline, LCSWA, LCAS

## 2016-11-14 LAB — GLUCOSE, CAPILLARY
GLUCOSE-CAPILLARY: 99 mg/dL (ref 65–99)
Glucose-Capillary: 118 mg/dL — ABNORMAL HIGH (ref 65–99)
Glucose-Capillary: 150 mg/dL — ABNORMAL HIGH (ref 65–99)
Glucose-Capillary: 101 mg/dL — ABNORMAL HIGH (ref 65–99)

## 2016-11-14 MED ORDER — LAMOTRIGINE 100 MG PO TABS
100.0000 mg | ORAL_TABLET | Freq: Every day | ORAL | Status: DC
  Administered 2016-11-15 – 2016-11-18 (×4): 100 mg via ORAL
  Filled 2016-11-14 (×4): qty 1

## 2016-11-14 MED ORDER — BUPROPION HCL ER (XL) 150 MG PO TB24
300.0000 mg | ORAL_TABLET | Freq: Every day | ORAL | Status: DC
Start: 2016-11-15 — End: 2016-11-19
  Administered 2016-11-15 – 2016-11-19 (×5): 300 mg via ORAL
  Filled 2016-11-14 (×5): qty 2

## 2016-11-14 NOTE — Progress Notes (Signed)
11/14/16 [place], mother.  CSW called at pt request.  Mrs. Liliane Channel said she knew that Odester had checked herself into the hospital, but she did not know where.  She is willing to come to a family meeting with Latrenda and agreed to come today at [place].  11/14/16 [place].  CSW called to confirm that she had spoken to [place] about Azriella moving in with them.  Mrs. Laverta Baltimore said she had spoken to her and was fine with Hannia moving in.  Marae's family attends the same church and Mrs. Long has met her mother before but does not know her well.  CSW will check back with her as discharge approaches but gave her tentative plan of early next week. Lurline Idol, LCSW

## 2016-11-14 NOTE — Progress Notes (Addendum)
Patient ID: Renee Turner, female   DOB: [birth year], 21 y.o.   MRN: [id number] Awake, sitting up in bed in street clothes (layered), talking with her roommate; pleasant, cooperative, receptive and came outside her room to talk with me privately. A&Ox3, denied any current of SI/HI, denied AV/H; thoughts were organized and coherent, no impairment or confusion noted; smiles during this conversations but evasive about why her depression get worse. CBG=133, no coverage required; Zoloft 100 mg tablet given at bedtime; Dr. Weber Cooks came in to assess the patient for possibly ECT appropriate.

## 2016-11-14 NOTE — [facility] Group Notes (Signed)
  [facility]  Date/Time: 11/14/16 0930  Type of Therapy/Topic:  Group Therapy:  Emotion Regulation  Participation Level:  Active   Mood: engaged, positive  Description of Group:    The purpose of this group is to assist patients in learning to regulate negative emotions and experience positive emotions. Patients will be guided to discuss ways in which they have been vulnerable to their negative emotions. These vulnerabilities will be juxtaposed with experiences of positive emotions or situations, and patients challenged to use positive emotions to combat negative ones. Special emphasis will be placed on coping with negative emotions in conflict situations, and patients will process healthy conflict resolution skills.  Therapeutic Goals: 1. Patient will identify two positive emotions or experiences to reflect on in order to balance out negative emotions:  2. Patient will label two or more emotions that they find the most difficult to experience:  3. Patient will be able to demonstrate positive conflict resolution skills through discussion or role plays:   Summary of Patient Progress: Pt appeared engaged through out the group session.  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  Pt involvement is significantly better than earlier in her stay.  Daleen SquibbGreg Shareese Macha, LCSW          Therapeutic Modalities:   Cognitive Behavioral Therapy Feelings Identification Dialectical Behavioral Therapy

## 2016-11-14 NOTE — Progress Notes (Signed)
Recreation Therapy Notes  Date: 01.10.19 Time: 1:00 pm Location: Craft Room  Group Topic: Self-esteem  Goal Area(s) Addresses:  Patient will write at least one positive trait about self. Patient will verbalize benefit of having a healthy self-esteem.  Behavioral Response: Attentive, Left early  Intervention: I Am  Activity: Patients were given a worksheet with the letter I on it and were instructed to write as many positive traits about themselves inside the letter.  Education: LRT educated patients on ways to increase their self-esteem.  Education Outcome: Patient left before LRT educated group.   Clinical Observations/Feedback: Patient wrote positive traits about self. Patient left group at approximately 1:40 pm and did not return to group.  Jacquelynn CreeGreene,Makelle Marrone M, LRT/CTRS 11/14/2016 3:33 PM

## 2016-11-14 NOTE — Plan of Care (Signed)
Problem: Activity: Goal: Sleeping patterns will improve Outcome: Progressing Patient slept for Estimated Hours of 7.45; safety maintained, no injury or falls during this shift.   

## 2016-11-14 NOTE — Progress Notes (Signed)
Patient with depressed affect, cooperative behavior with meals, meds and plan of care. Quiet speech, brief eye contact. Minimal interaction with peers. Patient states she "feels better today then she did on admission but is vague and unable to specify. Requests writer look up her work phone number for her so she can call her work place today. Takes nutritional shakes as ordered. Therapy groups encouraged. Safety maintained.
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
## 2016-11-15 LAB — GLUCOSE, CAPILLARY
GLUCOSE-CAPILLARY: 143 mg/dL — AB (ref 65–99)
GLUCOSE-CAPILLARY: 122 mg/dL — AB (ref 65–99)
GLUCOSE-CAPILLARY: 104 mg/dL — AB (ref 65–99)
Glucose-Capillary: 114 mg/dL — ABNORMAL HIGH (ref 65–99)

## 2016-11-15 NOTE — [facility] Group Notes (Signed)
[facility] LCSW Group Therapy   11/15/2016 9:30 am   Type of Therapy: Group Therapy   Participation Level: Active   Participation Quality: Attentive, Sharing and Supportive   Affect: Appropriate   Cognitive: Alert and Oriented   Insight: Developing/Improving and Engaged   Engagement in Therapy: Developing/Improving and Engaged   Modes of Intervention: Clarification, Confrontation, Discussion, Education, Exploration, Limit-setting, Orientation, Problem-solving, Rapport Building, Dance movement psychotherapisteality Testing, Socialization and Support   Summary of Progress/Problems: The topic for group was balance in life. Today's group focused on defining balance in one's own words, identifying things that can knock one off balance, and exploring healthy ways to maintain balance in life. Group members were asked to provide an example of a time when they felt off balance, describe how they handled that situation, and process healthier ways to regain balance in the future. Group members were asked to share the most important tool for maintaining balance [facility] and how they plan to apply this method after discharge. Pt was appropriate throughout group session and was encouraging to others who shared. Pt defined balance as "not doing too much of one thing." She identified her two most out-of-balance areas as mental/emotional and personal relationships. In order to achieve balance, pt stated that he will have to engage in therapy and being more confident.  Hampton AbbotKadijah Kevan Prouty, MSW, LCSWA 11/15/2016, 10:51AM
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
## 2016-11-15 NOTE — Progress Notes (Signed)
Patient looks sad & depressed.Affect brightens on approach.Patient stated that the negative thoughts bothering her.Denies suicidal or homicidal ideations and AV hallucinations.Compliant with medications.Support & encouragement given.

## 2016-11-15 NOTE — [facility] Group Notes (Signed)
Goals Group Date/Time: 11/15/2016 9:00 AM Type of Therapy and Topic: Group Therapy: Goals Group: SMART Goals   Participation Level: Minimal  Description of Group:    The purpose of a daily goals group is to assist and guide patients in setting recovery/wellness-related goals. The objective is to set goals as they relate to the crisis in which they were admitted. Patients will be using SMART goal modalities to set measurable goals. Characteristics of realistic goals will be discussed and patients will be assisted in setting and processing how one will reach their goal. Facilitator will also assist patients in applying interventions and coping skills learned in psycho-education groups to the SMART goal and process how one will achieve defined goal.   Therapeutic Goals:   -Patients will develop and document one goal related to or their crisis in which brought them into treatment.  -Patients will be guided by LCSW using SMART goal setting modality in how to set a measurable, attainable, realistic and time sensitive goal.  -Patients will process barriers in reaching goal.  -Patients will process interventions in how to overcome and successful in reaching goal.   Patient's Goal: attend all groups for the day.   Therapeutic Modalities:  Motivational Interviewing  Research officer, political partyCognitive Behavioral Therapy  Crisis Intervention Model  SMART goals setting  Daleen SquibbGreg Debroah Shuttleworth, [place]LCSW

## 2016-11-15 NOTE — Progress Notes (Signed)
Recreation Therapy Notes  Date: 01.11.18 Time: 1:00 pm Location: Craft Room  Group Topic: Leisure Education  Goal Area(s) Addresses:  Patient will identify activities for each letter of the alphabet. Patient will verbalize ability to integrate positive leisure into life post d/c. Patient will verbalize ability to use leisure as a Associate Professorcoping skill.  Behavioral Response: Attentive, Interactive  Intervention: Leisure Alphabet  Activity: Patients were given a Leisure Information systems managerAlphabet worksheet and were instructed to write healthy leisure activities for each letter of the alphabet.  Education: LRT educated patients on what they need to participate in leisure.  Education Outcome: Acknowledges education/In group clarification offered   Clinical Observations/Feedback: Patient wrote healthy leisure activities. Patient contributed to group discussion.  Jacquelynn CreeGreene,[place] Kosch M, LRT/CTRS 11/15/2016 4:34 PM

## 2016-11-15 NOTE — Progress Notes (Addendum)
11/14/16 1615 Family meeting between pt and her mother, Pearline CablesLengkur Rick. [phone]) CSW facilitated family meeting with pt permission.  Family had been unaware of pt location since she was admitted on 11/07/16 but they were aware that she was in the hospital somewhere.  Pt reported anxiety to CSW prior to the meeting and she did return to the demeanor she displayed upon admission: very flat and very slow to speak.  Pt was able to share with her mother that she did not want to return to the family home upon discharge and is making plans to live with the family of a friend she knows from church.  Mother was upset at this news but handled herself appropriately.  Pt initially stated the reason was due to the large number of people living in the home. (pt family consist of mother father, 4 children, including pt.  Also in the home is the father's cousin, who's family consists of 9 more people)  Mother immediately offered to arrange for the other family to stay elsewhere.  Pt at this point, after long silence, shared that in addition, pt feels there are problems in her relationship with her parents which also contributes to her desire to not return to the home.  Pt's mother asked a few more questions but pt provided little additional info.  Mother was again very appropriate and voiced support for pt decision, said she hoped that pt would at least visit with the family, and stated that the whole family missed her very much but wanted her to get whatever help she needed.  CSW closed meeting by telling mother that there are tentative discharge plans for early next week and that CSW will communicate with the family as requested and allowed by pt.  Mother shared with CSW as CSW walked her out from the meeting that pt speaks and shares very little at home and she and her husband therefore know very little about what is going on with her. Daleen SquibbGreg Olivier Frayre, LCSW

## 2016-11-15 NOTE — [facility] Group Notes (Signed)
[facility] Group Notes:  (Nursing/MHT/Case Management/Adjunct)  Date:  11/15/2016  Time:  3:49 PM  Type of Therapy:  Psychoeducational Skills  Participation Level:  Did Not Attend  Renee Turner [name] 11/15/2016, 3:49 PM
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
## 2016-11-15 NOTE — Tx Team (Signed)
Interdisciplinary Treatment and Diagnostic Plan Update  11/15/2016 Time of Session: 10:30am Renee Turner MRN: [id number]  Principal Diagnosis: Major depressive disorder, single episode, severe without psychotic features ([place])  Secondary Diagnoses: Principal Problem:   Major depressive disorder, single episode, severe without psychotic features ([place]) Active Problems:   Diabetes ([employer])   Posttraumatic stress disorder   Current Medications:  Current Facility-Administered Medications  Medication Dose Route Frequency Provider Last Rate Last Dose  . acetaminophen (TYLENOL) tablet 650 mg  650 mg Oral Q6H PRN Patrecia Pour, NP   650 mg at 11/08/16 1951  . alum & mag hydroxide-simeth (MAALOX/MYLANTA) 200-200-20 MG/5ML suspension 30 mL  30 mL Oral Q4H PRN Patrecia Pour, NP      . buPROPion (WELLBUTRIN XL) 24 hr tablet 300 mg  300 mg Oral Daily Clovis Fredrickson, MD   300 mg at 11/15/16 0746  . feeding supplement (GLUCERNA SHAKE) (GLUCERNA SHAKE) liquid 237 mL  237 mL Oral TID BM Jolanta B Pucilowska, MD   237 mL at 11/15/16 1010  . gabapentin (NEURONTIN) capsule 300 mg  300 mg Oral TID Clovis Fredrickson, MD   300 mg at 11/15/16 1145  . insulin aspart (novoLOG) injection 0-5 Units  0-5 Units Subcutaneous QHS Patrecia Pour, NP      . insulin aspart (novoLOG) injection 0-9 Units  0-9 Units Subcutaneous TID WC Patrecia Pour, NP   1 Units at 11/15/16 1145  . lamoTRIgine (LAMICTAL) tablet 100 mg  100 mg Oral QHS Jolanta B Pucilowska, MD      . loratadine (CLARITIN) tablet 10 mg  10 mg Oral Daily Lenward Chancellor, MD   10 mg at 11/15/16 0746  . magnesium hydroxide (MILK OF MAGNESIA) suspension 30 mL  30 mL Oral Daily PRN Patrecia Pour, NP      . metFORMIN (GLUCOPHAGE-XR) 24 hr tablet 1,000 mg  1,000 mg Oral BID WC Patrecia Pour, NP   1,000 mg at 11/15/16 0746  . sertraline (ZOLOFT) tablet 100 mg  100 mg Oral QHS Clovis Fredrickson, MD   100 mg at 11/14/16 2215   PTA  Medications: Prescriptions Prior to Admission  Medication Sig Dispense Refill Last Dose  . Exenatide ER (BYDUREON) 2 MG PEN Inject 2 mg into the skin once a week.   Past Month at Unknown time  . FARXIGA 10 MG TABS tablet Take 1 tablet by mouth daily.  2 Past Week at Unknown time  . gabapentin (NEURONTIN) 300 MG capsule Take 1 capsule by mouth 3 (three) times daily.  2 Past Week at Unknown time  . metFORMIN (GLUCOPHAGE-XR) 500 MG 24 hr tablet Take 1,000 mg by mouth 2 (two) times daily.  3 11/06/2016 at Unknown time    Patient Stressors: Marital or family conflict Medication change or noncompliance Traumatic event  Patient Strengths: Ability for insight Average or above average intelligence General fund of knowledge Motivation for treatment/growth Physical Health Supportive family/friends Work skills  Treatment Modalities: Medication Management, Group therapy, Case management,  1 to 1 session with clinician, Psychoeducation, Recreational therapy.   Physician Treatment Plan for Primary Diagnosis: Major depressive disorder, single episode, severe without psychotic features ([place]) Long Term Goal(s): Improvement in symptoms so as ready for discharge NA   Short Term Goals: Ability to identify changes in lifestyle to reduce recurrence of condition will improve Ability to verbalize feelings will improve Ability to disclose and discuss suicidal ideas Ability to demonstrate self-control will improve Ability to identify and develop  effective coping behaviors will improve Ability to maintain clinical measurements within normal limits will improve Compliance with prescribed medications will improve Ability to identify triggers associated with substance abuse/mental health issues will improve NA  Medication Management: Evaluate patient's response, side effects, and tolerance of medication regimen.  Therapeutic Interventions: 1 to 1 sessions, Unit Group sessions and Medication  administration.  Evaluation of Outcomes: Progressing  Physician Treatment Plan for Secondary Diagnosis: Principal Problem:   Major depressive disorder, single episode, severe without psychotic features ([place]) Active Problems:   Diabetes ([place])   Posttraumatic stress disorder  Long Term Goal(s): Improvement in symptoms so as ready for discharge NA   Short Term Goals: Ability to identify changes in lifestyle to reduce recurrence of condition will improve Ability to verbalize feelings will improve Ability to disclose and discuss suicidal ideas Ability to demonstrate self-control will improve Ability to identify and develop effective coping behaviors will improve Ability to maintain clinical measurements within normal limits will improve Compliance with prescribed medications will improve Ability to identify triggers associated with substance abuse/mental health issues will improve NA     Medication Management: Evaluate patient's response, side effects, and tolerance of medication regimen.  Therapeutic Interventions: 1 to 1 sessions, Unit Group sessions and Medication administration.  Evaluation of Outcomes: Progressing   RN Treatment Plan for Primary Diagnosis: Major depressive disorder, single episode, severe without psychotic features ([place]) Long Term Goal(s): Knowledge of disease and therapeutic regimen to maintain health will improve  Short Term Goals: Ability to participate in decision making will improve and Ability to verbalize feelings will improve  Medication Management: RN will administer medications as ordered by provider, will assess and evaluate patient's response and provide education to patient for prescribed medication. RN will report any adverse and/or side effects to prescribing provider.  Therapeutic Interventions: 1 on 1 counseling sessions, Psychoeducation, Medication administration, Evaluate responses to treatment, Monitor vital signs and CBGs as ordered,  Perform/monitor CIWA, COWS, AIMS and Fall Risk screenings as ordered, Perform wound care treatments as ordered.  Evaluation of Outcomes: Progressing   LCSW Treatment Plan for Primary Diagnosis: Major depressive disorder, single episode, severe without psychotic features ([place]) Long Term Goal(s): Safe transition to appropriate next level of care at discharge, Engage patient in therapeutic group addressing interpersonal concerns.  Short Term Goals: Engage patient in aftercare planning with referrals and resources, Increase social support, Increase ability to appropriately verbalize feelings and Increase skills for wellness and recovery  Therapeutic Interventions: Assess for all discharge needs, 1 to 1 time with Social worker, Explore available resources and support systems, Assess for adequacy in community support network, Educate family and significant other(s) on suicide prevention, Complete Psychosocial Assessment, Interpersonal group therapy.  Evaluation of Outcomes: Progressing   Progress in Treatment: Attending groups: Yes. Participating in groups: Yes. Taking medication as prescribed: Yes. Toleration medication: Yes. Family/Significant other contact made: Yes, individual(s) contacted:  Theo Dills Patient understands diagnosis: Yes. Discussing patient identified problems/goals with staff: Yes. Medical problems stabilized or resolved: Yes. Denies suicidal/homicidal ideation: Yes. Issues/concerns per patient self-inventory: No. Other: n/a  New problem(s) identified: None identified at this time.   New Short Term/Long Term Goal(s): None identified at this time.   Discharge Plan or Barriers: Patient will follow-up with [facility].   Reason for Continuation of Hospitalization: Anxiety Depression Medication stabilization  Estimated Length of Stay: 3 to 5 days.   Attendees: Patient:Renee Turner 11/15/2016 11:56 AM  Physician:  Dr. Bary Leriche, MD 11/15/2016 11:56 AM  Nursing: Polly Cobia, RN 11/15/2016 11:56 AM  RN Care Manager: 11/15/2016 11:56 AM  Social Worker: Jen Mow. Satira Sark 11/15/2016 11:56 AM  Recreational Therapist: Leonette Monarch, LRT/CTRS 11/15/2016 11:56 AM  Other:  11/15/2016 11:56 AM  Other:  11/15/2016 11:56 AM  Other: 11/15/2016 11:56 AM    Scribe for Treatment Team: Jolaine Click, LCSWA 11/15/2016 11:59 AM

## 2016-11-16 LAB — GLUCOSE, CAPILLARY
GLUCOSE-CAPILLARY: 166 mg/dL — AB (ref 65–99)
Glucose-Capillary: 120 mg/dL — ABNORMAL HIGH (ref 65–99)
Glucose-Capillary: 122 mg/dL — ABNORMAL HIGH (ref 65–99)
Glucose-Capillary: 108 mg/dL — ABNORMAL HIGH (ref 65–99)

## 2016-11-16 NOTE — Progress Notes (Signed)
Pt endorses passive SI. Contracts for safety. Denies HI/AVH. Minimal interaction with staff or peers. Medication compliant. Affect blunted. Denies pain. Cooperative with treatment. Denies pain and voices no additional concerns at this time. Will continue to monitor.
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
## 2016-11-16 NOTE — Progress Notes (Signed)
Pt awake, alert, oriented and up on unit today. Reports fair sleep last night without the help of sleep medications, poor appetite, normal energy, good concentration. Rates depression 4/10, hopelessness 4/10, anxiety 4/10 (low0-10high). Reports goal today is "finalize my plans for discharge" by "see my social worker." Pt is anticipating discharge to a friend's home and reports plans to attend a day program. Pt continues to appear flat, depressed, but brightens on approach. Minimally interacts with staff/peers. Pt is medication compliant.   Support and encouragement provided with use of therapeutic communication. Medications administered as ordered with education. Safety maintained with every 15 minute checks. Will continue to monitor.

## 2016-11-16 NOTE — Progress Notes (Signed)
Pt to medication room for medication administration before dinner. Pt appears more depressed, flat than earlier in the day. When asked if pt is having thoughts of suicide, pt will not respond, just drops her head. Reports worry regarding ECT and her decision to follow through with physician recommendations or not. Mainly voices concern over having to stay in the hospital longer in order to receive ECT. Encouraged pt to further discuss ECT with physician. Support and encouragement provided. Safety maintained with every 15 minute checks. Will continue to monitor.

## 2016-11-16 NOTE — Progress Notes (Signed)
Patient spent most of shift resting in bed, she had minimal interaction and conversationwith peers and staff. She was medication compliant although no behavioral issues to report on shift at this time. She remains flat and sad on approach but when engaged in conversation she is logical. Patient appears to be resting in bed quietly at this time.

## 2016-11-16 NOTE — Progress Notes (Signed)
Recreation Therapy Notes  Date: 01.12.18 Time: 1:00 pm Location: Craft Room  Group Topic: Social Skills  Goal Area(s) Addresses:  Patient will effectively work with peer towards shared goal. Patient will identify skills used to make activity successful. Patient will identify how skills used during activity can be used to reach post d/c. goals.  Behavioral Response: Attentive, Interactive  Intervention: Life Boat  Activity: Patients were given a scenario that we were on a big ship and the boat sprung a leak and was sinking. They were given a list of 15 people Ship broker([employer], Runner, broadcasting/film/videoteacher, nurse, etc.) and were instructed to pick a list of 8 people to take with them on a bigger boat and 7 people who would be put in a raft.  Education: LRT educated patients on healthy support systems.  Education Outcome: In group clarification offered   Clinical Observations/Feedback: Patient worked with peers to solve problem. Patient would nod her head in agreement with peers and would raise her hand for people she thought should come with them. Patient did not contribute to group discussion.  Jacquelynn CreeGreene,Khai Torbert M, LRT/CTRS 11/16/2016 3:29 PM

## 2016-11-16 NOTE — Plan of Care (Signed)
Problem: Coping: Goal: Ability to verbalize feelings will improve Outcome: Progressing Pt verbalizes more of her thoughts/feelings, asked/allowed nurse to read her journal entry.

## 2016-11-16 NOTE — Consult Note (Signed)
Psychiatry: Follow-up ECT note. I met with the patient again today and also reviewed the chart and discussed the case with Dr. Bary Leriche. I gave patient is much education as I could do about ECT with the pros and cons and risks and benefits. Patient clearly seemed to understand and asked appropriate questions. I recommended to her that we could start treatment on Monday with the possibility of continuing as an outpatient if she is ready to be discharged. Ultimately the patient preferred not to start ECT. I am not going to put her on the schedule at this point. I will continue to follow-up with her regularly.

## 2016-11-16 NOTE — [facility] Group Notes (Signed)
[facility] LCSW Group Therapy   11/16/2016 9:30 am Type of Therapy: Group Therapy   Participation Level: Active   Participation Quality: Attentive, Sharing and Supportive   Affect: Depressed and Flat   Cognitive: Alert and Oriented   Insight: Developing/Improving and Engaged   Engagement in Therapy: Developing/Improving and Engaged   Modes of Intervention: Clarification, Confrontation, Discussion, Education, Exploration,  Limit-setting, Orientation, Problem-solving, Rapport Building, Dance movement psychotherapisteality Testing, Socialization and Support   Summary of Progress/Problems: Pt identified obstacles faced currently and processed barriers involved in overcoming these obstacles. Pt identified steps necessary for overcoming these obstacles and explored motivation (internal and external) for facing these difficulties head on. Pt further identified one area of concern in their lives and chose a goal to focus on for today. Pt reported that to the pt relapse means "going backwards".  Pt reported that a trigger for the pt is "feeling like a failure". Pt reported that in order to avoid relapse the pt intends to change the way that the pt views failure and most importantly to change failure to the word "regroup". Pt was polite and cooperative with the CSW and other group members and focused and attentive to the topics discussed and the sharing of others.  Pt seems to have made great improvement during group, as evidenced by increased sharing, sharing at length when prompted and pt presents as happy and increasingly energetic, as compared to previous sessions.     Dorothe PeaJonathan F. Cydne Grahn, LCSWA, LCAS

## 2016-11-16 NOTE — Plan of Care (Signed)
Problem: Medication: Goal: Compliance with prescribed medication regimen will improve Outcome: Progressing Pt has been compliant with prescribed medications   

## 2016-11-17 LAB — GLUCOSE, CAPILLARY
GLUCOSE-CAPILLARY: 118 mg/dL — AB (ref 65–99)
GLUCOSE-CAPILLARY: 107 mg/dL — AB (ref 65–99)
Glucose-Capillary: 153 mg/dL — ABNORMAL HIGH (ref 65–99)
Glucose-Capillary: 140 mg/dL — ABNORMAL HIGH (ref 65–99)

## 2016-11-17 NOTE — Progress Notes (Signed)
[facility] LCSW Group Therapy  11/17/2016 1pm  Type of Therapy:  Group Therapy  Participation Level:  Active  Participation Quality:  Appropriate, Sharing and Supportive  Affect:  Appropriate  Cognitive:  Oriented  Insight:  Developing/Improving  Engagement in Therapy:  Engaged  Modes of Intervention:  Clarification, Confrontation, Discussion, Exploration, Socialization and Support  Summary of Progress The topic for today was feelings related to overcoming obstacles. Patient were asked to introduce himself or herself and reflect an obstacle they face. In group peers supported  each other and shared ideas on how to resolve these obstacles. This patient was quieter than the last time we talked in group. Patient was able to answer questions but struggles to come up with something to say. Johnella MoloneyBandi, Chonita Gadea M LCSW 11/17/2016, 4pm
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
## 2016-11-17 NOTE — [facility] Group Notes (Signed)
[facility] Group Notes:  (Nursing/MHT/Case Management/Adjunct)  Date:  11/17/2016  Time:  5:24 PM  Type of Therapy:  Music Therapy  Participation Level:  Active  Participation Quality:  Appropriate  Affect:  Appropriate  Cognitive:  Alert  Insight:  Good  Engagement in Group:  Engaged  Modes of Intervention:  Socialization  Summary of Progress/Problems:  Sterling BigGregory  Kenia Teagarden 11/17/2016, 5:24 PM

## 2016-11-17 NOTE — Plan of Care (Signed)
Problem: Nutrition: Goal: Adequate nutrition will be maintained Outcome: Progressing Pt reports poor appetite today. Eats meals in dayroom and reports she drinks Glucerna shakes as provided.

## 2016-11-17 NOTE — [facility] Group Notes (Signed)
[facility] Group Notes:  (Nursing/MHT/Case Management/Adjunct)  Date:  11/17/2016  Time:  1:48 PM  Type of Therapy:  Music Therapy  Participation Level:  Active  Participation Quality:  Appropriate  Affect:  Appropriate  Cognitive:  Appropriate  Insight:  Good  Engagement in Group:  Engaged and Supportive  Modes of Intervention:  Education and Socialization  Summary of Progress/Problems:  Renee Turner  Renee Turner 11/17/2016, 1:48 PM
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
## 2016-11-18 LAB — GLUCOSE, CAPILLARY
GLUCOSE-CAPILLARY: 148 mg/dL — AB (ref 65–99)
Glucose-Capillary: 120 mg/dL — ABNORMAL HIGH (ref 65–99)
Glucose-Capillary: 142 mg/dL — ABNORMAL HIGH (ref 65–99)
Glucose-Capillary: 122 mg/dL — ABNORMAL HIGH (ref 65–99)

## 2016-11-18 MED ORDER — HYDROXYZINE HCL 50 MG PO TABS
50.0000 mg | ORAL_TABLET | Freq: Three times a day (TID) | ORAL | Status: DC | PRN
  Administered 2016-11-18: 50 mg via ORAL
  Filled 2016-11-18: qty 1

## 2016-11-18 NOTE — Progress Notes (Signed)
D: Pt denies SI/HI/AVH. Pt is pleasant and cooperative, affect is flat and sad but brightens upon approach. Pt stated she " feels depressed and is worried about life" , patient is visible on the milieu, appears less anxious, minimal interaction  with peers and staff noted.  A: Pt was offered support and encouragement. Pt was given scheduled medications. Pt was encouraged to attend groups. Q 15 minute checks were done for safety.  R:Pt did not attends group. Pt is taking medication. Pt has no complaints.Pt receptive to treatment and safety maintained on unit.

## 2016-11-18 NOTE — Plan of Care (Signed)
Problem: Education: Goal: Ability to make informed decisions regarding treatment will improve Outcome: Progressing Patient denies SI but continues to have a flat, sad affect. Contracts for safety.

## 2016-11-18 NOTE — [facility] Group Notes (Signed)
[facility] Group Notes:  (Nursing/MHT/Case Management/Adjunct)  Date:  11/18/2016  Time:  5:08 AM  Type of Therapy:  Psychoeducational Skills  Participation Level:  Did Not Attend  Summary of Progress/Problems:  Renee MilroyLaquanda Y Moris Ratchford 11/18/2016, 5:08 AM

## 2016-11-18 NOTE — [facility] Group Notes (Signed)
[facility] LCSW Group Therapy  11/18/2016 1 pm  Type of Therapy:  Group Therapy  Participation Level:  Active  Participation Quality:  Appropriate, Attentive and Supportive  Affect:  Blunt  Cognitive:  Oriented  Insight:  Developing/Improving  Engagement in Therapy:  Engaged  Modes of Intervention:  Discussion, Education, Problem-solving, Socialization and Support  Summary of Progress/Problems:Balance in Life This group will address the concept of balance and how it feels and looks when one is unbalanced. Patients will be encouraged to process areas in their lives that are out of balance, and identify reasons for remaining unbalanced. Facilitators will guide patients utilizing problem- solving interventions to address and correct the stressor making their life unbalanced. Understanding and applying boundaries will be explored and addressed for obtaining and maintaining a balanced life. Patients will be encouraged to explore ways to assertively make their unbalanced needs known to significant others in their lives, using other group members and facilitator for support and feedback. This patient was able to follow group discussions and agreed that keeping follow up appointments and taking medications assist in remaining balanced. She provided good support to  peers. She was again very quiet and needed a lot of encouragement to contribute.  Arrie SenateBandi, Cylus Douville M 11/18/2016, 2:23 PM

## 2016-11-18 NOTE — Progress Notes (Signed)
Patient observed to be somewhat isolative. She stated that she was still feeling depressed. Patient maintained a flat, sad affect. She attended group, remained cooperative and medication compliant. Medication and Insulin education done, she verbalized understanding. Patient denied SI/AVH and contracted for safety. She was encourage to verbalize concerns/feelings to nursing staff. She verbalized understanding.
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
## 2016-11-19 LAB — GLUCOSE, CAPILLARY
GLUCOSE-CAPILLARY: 128 mg/dL — AB (ref 65–99)
Glucose-Capillary: 103 mg/dL — ABNORMAL HIGH (ref 65–99)

## 2016-11-19 MED ORDER — HYDROXYZINE HCL 50 MG PO TABS: 50.0000 mg | ORAL_TABLET | Freq: Three times a day (TID) | ORAL | 1 refills | Status: DC | PRN

## 2016-11-19 MED ORDER — BUPROPION HCL ER (XL) 300 MG PO TB24: 300.0000 mg | ORAL_TABLET | Freq: Every day | ORAL | 1 refills | Status: DC

## 2016-11-19 MED ORDER — SERTRALINE HCL 100 MG PO TABS: 100.0000 mg | ORAL_TABLET | Freq: Every day | ORAL | 1 refills | Status: DC

## 2016-11-19 MED ORDER — LAMOTRIGINE 100 MG PO TABS: 100.0000 mg | ORAL_TABLET | Freq: Every day | ORAL | 1 refills | Status: DC

## 2016-11-19 NOTE — Progress Notes (Signed)
Pt's mother to pick her up for transport home at 1600 today, 11/19/2016.

## 2016-11-19 NOTE — [facility] Suicide Risk Assessment (Signed)
[facility]   Principal Problem: Major depressive disorder, single episode, severe without psychotic features [facility]([name]) Discharge Diagnoses:  Patient Active Problem List   Diagnosis Date Noted  . Posttraumatic stress disorder [F43.10] 11/13/2016  . Major depressive disorder, single episode, severe without psychotic features ([name]) [F32.2] 11/07/2016  . Diabetes ([name]) [E11.9] 11/07/2016    Total Time spent with patient: 30 minutes  Musculoskeletal: Strength & Muscle Tone: within normal limits Gait & Station: normal Patient leans: N/A  Psychiatric Specialty Exam: Review of Systems  Psychiatric/Behavioral: Positive for depression.  All other systems reviewed and are negative.   Blood pressure 104/73, pulse 85, temperature 98.8 F (37.1 C), temperature source Oral, resp. rate 18, height 5' 7.5" (1.715 m), weight 91.2 kg (201 lb), last menstrual period 10/24/2016, SpO2 100 %.Body mass index is 31.02 kg/m.  General Appearance: Casual  Eye Contact::  Good  Speech:  Clear and Coherent409  Volume:  Normal  Mood:  Anxious and Depressed  Affect:  Blunt  Thought Process:  Goal Directed and Descriptions of Associations: Intact  Orientation:  Full (Time, Place, and Person)  Thought Content:  WDL  Suicidal Thoughts:  No  Homicidal Thoughts:  No  Memory:  Immediate;   Fair Recent;   Fair Remote;   Fair  Judgement:  Fair  Insight:  Fair  Psychomotor Activity:  Decreased  Concentration:  Fair  Recall:  [employer]  Fund of Knowledge:Fair  Language: Fair  Akathisia:  No  Handed:  Right  AIMS (if indicated):     Assets:  Communication Skills Desire for Improvement Financial Resources/Insurance Housing Resilience Social Support Transportation  Sleep:  Number of Hours: 7.15  Cognition: WNL  ADL's:  Intact   Mental Status Per Nursing Assessment::   On Admission:  Suicidal ideation indicated by patient, Self-harm thoughts  Demographic Factors:  Adolescent or  young adult and Low socioeconomic status  Loss Factors: Decrease in vocational status, Decline in physical health and Financial problems/change in socioeconomic status  Historical Factors: Impulsivity  Risk Reduction Factors:   Sense of responsibility to family, Employed, Living with another person, especially a relative and Positive social support  Continued Clinical Symptoms:  Depression:   Impulsivity  Cognitive Features That Contribute To Risk:  None    Suicide Risk:  Minimal: No identifiable suicidal ideation.  Patients presenting with no risk factors but with morbid ruminations; may be classified as minimal risk based on the severity of the depressive symptoms  Follow-up Information    [facility]. Go on 11/20/2016.   Why:  Appointment on this date at 1:30pm. Please bring Photo I.D. and insurance card information.  Contact information: [address], [address] [place], [id number] [phone]          Plan Of Care/Follow-up recommendations:  Activity:  As tolerated. Diet:  Low sodium heart healthy ADA diet. Other:  Keep follow-up appointments.  Kristine LineaJolanta Arianni Gallego, MD 11/19/2016, 9:32 AM

## 2016-11-19 NOTE — [facility] Group Notes (Signed)
[facility] LCSW Group Therapy Note  Date/Time: 11/19/16 0930  Type of Therapy and Topic:  Group Therapy:  Overcoming Obstacles  Participation Level:  Pt was invited but did not attend group.  Description of Group:    In this group patients will be encouraged to explore what they see as obstacles to their own wellness and recovery. They will be guided to discuss their thoughts, feelings, and behaviors related to these obstacles. The group will process together ways to cope with barriers, with attention given to specific choices patients can make. Each patient will be challenged to identify changes they are motivated to make in order to overcome their obstacles. This group will be process-oriented, with patients participating in exploration of their own experiences as well as giving and receiving support and challenge from other group members.  Therapeutic Goals: 1. Patient will identify personal and current obstacles as they relate to admission. 2. Patient will identify barriers that currently interfere with their wellness or overcoming obstacles.  3. Patient will identify feelings, thought process and behaviors related to these barriers. 4. Patient will identify two changes they are willing to make to overcome these obstacles:    Summary of Patient Progress    Therapeutic Modalities:   Cognitive Behavioral Therapy Solution Focused Therapy Motivational Interviewing Relapse Prevention Therapy  Greg Raunak Antuna, LCSW 

## 2016-11-19 NOTE — Discharge Summary (Deleted)
Physician Discharge Summary Note  Patient:  Renee Turner is an 21 y.o., female MRN:  [id number] DOB:  1996-04-18 Patient phone:  [phone] (home)  Patient address:   [address] [place] [address],  Total Time spent with patient: 30 minutes  Date of Admission:  11/07/2016 Date of Discharge: 11/19/2016  Reason for Admission:  Suicidal ideation.   Identifying data. Ms. Renee Turner is a 21 year old female with history of severe depression.  Chief complaint. "Suicidal thoughts."  History of present illness. Information was obtained from the patient and the chart. The patient has a long history of depression beginning at the age of 8 with multiple bouts of depression. She has not been treated until she developed type 2 diabetes in September of 2017 and started seeing an endocrinologist. She was started on the Zoloft with adequate response but stopped taking the Zoloft. She was no longer able to see her psychiatrist due to transportation problems. She became increasingly depressed with excessive sleep, decreased appetite, anhedonia, feeling of guilt and hopelessness worthlessness, poor energy and concentration, social isolation, crying spells. She has had on and off suicidal thoughts. She is unwilling to disclose her plan. She came to [facility] emergency room complaining of worsening of depression and suicidal ideation and was admitted to the hospital. The patient presented with severe psychomotor retardation. She was there is simple answers or does not answer all. There is latency of response is tremendous. She does not maintain any eye contact. Her affect is completely flat. The patient denies symptoms of severe anxiety except for nightmares and flashbacks from PTSD stemming from traumatic experience back home in [place]. She denies psychotic symptoms but at times appears as if attending to internal stimuli. She denies symptoms suggestive of bipolar mania. She denies drug or alcohol use.  Past  psychiatric history. Her first episode of depression was at the age of 13 but she received any treatment. She denies ever attempting suicide but has history of cutting. Last Week have been over the summer. She was tried on Zoloft with good response but did not continue medication and does not have a provider at the moment.  Family psychiatric history. Nonreported.  Social history. She graduated from high school and went to [employer] for a year to study nursing. She stopped going to school when she became depressed. She did not take a medical withdrawal. 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 At the moment there are nine more family members staying with them. In September of last year she was diagnosed with type 2 diabetes and started on medication. She is partially compliant with her diabetes treatment.  Principal Problem: Major depressive disorder, single episode, severe without psychotic features [facility]) Discharge Diagnoses: Patient Active Problem List   Diagnosis Date Noted  . Posttraumatic stress disorder [F43.10] 11/13/2016  . Major depressive disorder, single episode, severe without psychotic features ([place]) [F32.2] 11/07/2016  . Diabetes ([place]) [E11.9] 11/07/2016   Past Medical History:  Past Medical History:  Diagnosis Date  . Anxiety   . Depression   . Diabetes mellitus without complication ([place])    Type II   History reviewed. No pertinent surgical history. Family History: History reviewed. No pertinent family history.  Social History:  History  Alcohol Use No     History  Drug Use No    Social History   Social History  . Marital status: Single    Spouse name: N/A  .  Number of children: N/A  . Years of education: N/A   Social History Main Topics  . Smoking status: Never Smoker  . Smokeless tobacco: Never Used  . Alcohol use No  . Drug use: No  . Sexual activity: No   Other Topics Concern  . None   Social  History Narrative  . None    Hospital Course:    Ms. Renee Turner is a 21 year old female with a history of depression and newly diagnosed diabetes admitted for worsening of depression and suicidal ideation in the context of medication noncompliance.  1. Suicidal ideation. Resolved. The patient is able to contract for safety. She is forward thinking and optimistic about the future.   2. Mood. The patient was treated with Zoloft, Wellbutrin and Lamictal for depression and mood stabilization. We offered ECT treatment but the patient declined.   3. Diabetes. The patient suffers type 2 diabetes. She has been maintained on metformin, Bydureon and Neurontin at home. She was on an ADA diet, blood glucose monitoring, Sliding scale insulin and Metformin in the hospital with adequate blood glucose control. Bydureon as it is not on our formulary.   4. Insomnia. Trazodone wss available.   5. Poor appetite. We offered Glucerna.  6. Anxiety. We offered Vistaril.  7. Disposition. She was discharged with family friend. She will follow up with day program.    Physical Findings: AIMS: Facial and Oral Movements Muscles of Facial Expression: None, normal Lips and Perioral Area: None, normal Jaw: None, normal Tongue: None, normal,Extremity Movements Upper (arms, wrists, hands, fingers): None, normal Lower (legs, knees, ankles, toes): None, normal, Trunk Movements Neck, shoulders, hips: None, normal, Overall Severity Severity of abnormal movements (highest score from questions above): None, normal Incapacitation due to abnormal movements: None, normal Patient's awareness of abnormal movements (rate only patient's report): No Awareness, Dental Status Current problems with teeth and/or dentures?: No Does patient usually wear dentures?: No  CIWA:    COWS:     Musculoskeletal: Strength & Muscle Tone: within normal limits Gait & Station: normal Patient leans: N/A  Psychiatric Specialty Exam: Physical  Exam  Nursing note and vitals reviewed.   Review of Systems  Psychiatric/Behavioral: Positive for depression.  All other systems reviewed and are negative.   Blood pressure 104/73, pulse 85, temperature 98.8 F (37.1 C), temperature source Oral, resp. rate 18, height 5' 7.5" (1.715 m), weight 91.2 kg (201 lb), last menstrual period 10/24/2016, SpO2 100 %.Body mass index is 31.02 kg/m.  General Appearance: Casual  Eye Contact:  Good  Speech:  Clear and Coherent  Volume:  Normal  Mood:  Anxious and Depressed  Affect:  Blunt  Thought Process:  Goal Directed and Descriptions of Associations: Intact  Orientation:  Full (Time, Place, and Person)  Thought Content:  WDL  Suicidal Thoughts:  No  Homicidal Thoughts:  No  Memory:  Immediate;   Fair Recent;   Fair Remote;   Fair  Judgement:  Fair  Insight:  Fair  Psychomotor Activity:  Decreased  Concentration:  Concentration: Fair and Attention Span: Fair  Recall:  [employer] of Knowledge:  Fair  Language:  Fair  Akathisia:  No  Handed:  Right  AIMS (if indicated):     Assets:  Communication Skills Desire for Improvement Financial Resources/Insurance Housing Resilience Social Support Transportation Vocational/Educational  ADL's:  Intact  Cognition:  WNL  Sleep:  Number of Hours: 7.15     Have you used any form of tobacco in the last 30  days? (Cigarettes, Smokeless Tobacco, Cigars, and/or Pipes): No  Has this patient used any form of tobacco in the last 30 days? (Cigarettes, Smokeless Tobacco, Cigars, and/or Pipes) Yes, No  Blood Alcohol level:  Lab Results  Component Value Date   ETH <5 16/08/9603    Metabolic Disorder Labs:  No results found for: HGBA1C, MPG No results found for: PROLACTIN No results found for: CHOL, TRIG, HDL, CHOLHDL, VLDL, LDLCALC  See Psychiatric Specialty Exam and Suicide Risk Assessment completed by Attending Physician prior to discharge.  Discharge destination:  Home  Is patient on  multiple antipsychotic therapies at discharge:  No   Has Patient had three or more failed trials of antipsychotic monotherapy by history:  No  Recommended Plan for Multiple Antipsychotic Therapies: NA  Discharge Instructions    Diet - low sodium heart healthy    Complete by:  As directed    Increase activity slowly    Complete by:  As directed      Allergies as of 11/19/2016   No Known Allergies     Medication List    STOP taking these medications   FARXIGA 10 MG Tabs tablet Generic drug:  dapagliflozin propanediol     TAKE these medications     Indication  buPROPion 300 MG 24 hr tablet Commonly known as:  WELLBUTRIN XL Take 1 tablet (300 mg total) by mouth daily. Start taking on:  11/20/2016  Indication:  Major Depressive Disorder   BYDUREON 2 MG Pen Generic drug:  Exenatide ER Inject 2 mg into the skin once a week.  Indication:  Type 2 Diabetes   gabapentin 300 MG capsule Commonly known as:  NEURONTIN Take 1 capsule by mouth 3 (three) times daily.  Indication:  Neurogenic Pain   hydrOXYzine 50 MG tablet Commonly known as:  ATARAX/VISTARIL Take 1 tablet (50 mg total) by mouth 3 (three) times daily as needed for anxiety (sleep).  Indication:  Anxiety Neurosis   lamoTRIgine 100 MG tablet Commonly known as:  LAMICTAL Take 1 tablet (100 mg total) by mouth at bedtime.  Indication:  Depression   metFORMIN 500 MG 24 hr tablet Commonly known as:  GLUCOPHAGE-XR Take 1,000 mg by mouth 2 (two) times daily.  Indication:  Type 2 Diabetes   sertraline 100 MG tablet Commonly known as:  ZOLOFT Take 1 tablet (100 mg total) by mouth at bedtime.  Indication:  Major Depressive Disorder      Follow-up Information    [facility]. Go on 11/20/2016.   Why:  Appointment on this date at 1:30pm. Please bring Photo I.D. and insurance card information.  Contact information: [address] [place],  [address] [phone]          Follow-up recommendations:  Activity:  As tolerated. Diet:  Low sodium heart healthy ADA diet. Other:  Keep follow-up appointments.  Comments:    Signed: Orson Slick, MD 11/19/2016, 9:35 AM

## 2016-11-19 NOTE — Progress Notes (Signed)
Recreation Therapy Notes  Date: 01.15.18 Time: 1:00 pm Location: Craft Room  Group Topic: Wellness  Goal Area(s) Addresses:  Patient will identify at least one item per dimension of health. Patient will examine areas they are deficient in.  Behavioral Response: Attentive  Intervention: 6 Dimensions of Health  Activity: Patients were given a definition sheet with each dimension. Patients were given a worksheet with each dimension listed and were instructed to write at least one item they were currently doing in each dimension. LRT encouraged patient to write 2-3 items.  Education: LRT educated patients on ways to improve each dimension.  Education Outcome: In group clarification offered   Clinical Observations/Feedback: Patient wrote at least one item in each dimension. Patient did not contribute to group discussion.  Jacquelynn CreeGreene,Rudie Rikard M, LRT/CTRS 11/19/2016 2:12 PM

## 2016-11-19 NOTE — Progress Notes (Signed)
Pt awake, alert, oriented and up on unit today. Reports good sleep last night after she took a PRN dose of sleep medication as ordered. Reports fair appetite, normal energy, good concentration. Rates depression 3/10, hopelessness 2/10, anxiety 2/10 (low 0-10 high). Denies SI/HI/AVH. Improvement noted in affect, brightens, smiles on approach, does not appear as flat. Noted to be in dayroom and in hallway this morning appropriately interacting with peers. Goal today is "work on discharge" by "meeting with my doctor." Pt does attend group. Pt is medication compliant.   Support and encouragement provided with use of therapeutic communication. Medications administered as ordered with education. Safety maintained with every 15 minute checks. Will continue to monitor.

## 2016-11-19 NOTE — Discharge Summary (Signed)
Physician Discharge Summary Note  Patient:  Renee Turner is an 21 y.o., female MRN:  [id number] DOB:  11-09-1995 Patient phone:  [phone] (home)  Patient address:   [address] [place] [address],  Total Time spent with patient: 30 minutes  Date of Admission:  11/07/2016 Date of Discharge: 11/19/2016  Reason for Admission:  Suicidal ideation.   Identifying data. Renee Turner is a 21 year old female with history of severe depression.  Chief complaint. "Suicidal thoughts."  History of present illness. Information was obtained from the patient and the chart. The patient has a long history of depression beginning at the age of 11 with multiple bouts of depression. She has not been treated until she developed type 2 diabetes in September of 2017 and started seeing an endocrinologist. She was started on the Zoloft with adequate response but stopped taking the Zoloft. She was no longer able to see her psychiatrist due to transportation problems. She became increasingly depressed with excessive sleep, decreased appetite, anhedonia, feeling of guilt and hopelessness worthlessness, poor energy and concentration, social isolation, crying spells. She has had on and off suicidal thoughts. She is unwilling to disclose her plan. She came to [facility] emergency room complaining of worsening of depression and suicidal ideation and was admitted to the hospital. The patient presented with severe psychomotor retardation. She was there is simple answers or does not answer all. There is latency of response is tremendous. She does not maintain any eye contact. Her affect is completely flat. The patient denies symptoms of severe anxiety except for nightmares and flashbacks from PTSD stemming from traumatic experience back home in [place]. She denies psychotic symptoms but at times appears as if attending to internal stimuli. She denies symptoms suggestive of bipolar mania. She denies drug or alcohol use.  Past  psychiatric history. Her first episode of depression was at the age of 71 but she received any treatment. She denies ever attempting suicide but has history of cutting. Last Week have been over the summer. She was tried on Zoloft with good response but did not continue medication and does not have a provider at the moment.  Family psychiatric history. Nonreported.  Social history. She graduated from high school and went to [employer] for a year to study nursing. She stopped going to school when she became depressed. She did not take a medical withdrawal. 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 At the moment there are nine more family members staying with them. In September of last year she was diagnosed with type 2 diabetes and started on medication. She is partially compliant with her diabetes treatment.  Principal Problem: Major depressive disorder, recurrent severe without psychotic features [facility]) Discharge Diagnoses: Patient Active Problem List   Diagnosis Date Noted  . Posttraumatic stress disorder [F43.10] 11/13/2016  . Major depressive disorder, recurrent severe without psychotic features ([place]) [F33.2] 11/07/2016  . Diabetes ([place]) [E11.9] 11/07/2016   Past Medical History:  Past Medical History:  Diagnosis Date  . Anxiety   . Depression   . Diabetes mellitus without complication ([place])    Type II   History reviewed. No pertinent surgical history. Family History: History reviewed. No pertinent family history.  Social History:  History  Alcohol Use No     History  Drug Use No    Social History   Social History  . Marital status: Single    Spouse name: N/A  . Number of  children: N/A  . Years of education: N/A   Social History Main Topics  . Smoking status: Never Smoker  . Smokeless tobacco: Never Used  . Alcohol use No  . Drug use: No  . Sexual activity: No   Other Topics Concern  . None   Social History Narrative   . None    Hospital Course:    Renee Turner is a 21 year old female with a history of depression and newly diagnosed diabetes admitted for worsening of depression and suicidal ideation in the context of medication noncompliance.  1. Suicidal ideation. Resolved. The patient is able to contract for safety. She is forward thinking and optimistic about the future.   2. Mood. The patient was treated with Zoloft, Wellbutrin and Lamictal for depression and mood stabilization. We offered ECT treatment but the patient declined.   3. Diabetes. The patient suffers type 2 diabetes. She has been maintained on metformin, Bydureon and Neurontin at home. She was on an ADA diet, blood glucose monitoring, Sliding scale insulin and Metformin in the hospital with adequate blood glucose control. Bydureon as it is not on our formulary.   4. Insomnia. Trazodone wss available.   5. Poor appetite. We offered Glucerna.  6. Anxiety. We offered Vistaril.  7. Disposition. She was discharged with family friend. She will follow up with day program.    Physical Findings: AIMS: Facial and Oral Movements Muscles of Facial Expression: None, normal Lips and Perioral Area: None, normal Jaw: None, normal Tongue: None, normal,Extremity Movements Upper (arms, wrists, hands, fingers): None, normal Lower (legs, knees, ankles, toes): None, normal, Trunk Movements Neck, shoulders, hips: None, normal, Overall Severity Severity of abnormal movements (highest score from questions above): None, normal Incapacitation due to abnormal movements: None, normal Patient's awareness of abnormal movements (rate only patient's report): No Awareness, Dental Status Current problems with teeth and/or dentures?: No Does patient usually wear dentures?: No  CIWA:    COWS:     Musculoskeletal: Strength & Muscle Tone: within normal limits Gait & Station: normal Patient leans: N/A  Psychiatric Specialty Exam: Physical Exam  Nursing  note and vitals reviewed.   Review of Systems  Psychiatric/Behavioral: Positive for depression.  All other systems reviewed and are negative.   Blood pressure 104/73, pulse 85, temperature 98.8 F (37.1 C), temperature source Oral, resp. rate 18, height 5' 7.5" (1.715 m), weight 91.2 kg (201 lb), last menstrual period 10/24/2016, SpO2 100 %.Body mass index is 31.02 kg/m.  General Appearance: Casual  Eye Contact:  Good  Speech:  Clear and Coherent  Volume:  Normal  Mood:  Anxious and Depressed  Affect:  Blunt  Thought Process:  Goal Directed and Descriptions of Associations: Intact  Orientation:  Full (Time, Place, and Person)  Thought Content:  WDL  Suicidal Thoughts:  No  Homicidal Thoughts:  No  Memory:  Immediate;   Fair Recent;   Fair Remote;   Fair  Judgement:  Fair  Insight:  Fair  Psychomotor Activity:  Decreased  Concentration:  Concentration: Fair and Attention Span: Fair  Recall:  [employer] of Knowledge:  Fair  Language:  Fair  Akathisia:  No  Handed:  Right  AIMS (if indicated):     Assets:  Communication Skills Desire for Improvement Financial Resources/Insurance Housing Resilience Social Support Transportation Vocational/Educational  ADL's:  Intact  Cognition:  WNL  Sleep:  Number of Hours: 7.15     Have you used any form of tobacco in the last 30 days? (Cigarettes,  Smokeless Tobacco, Cigars, and/or Pipes): No  Has this patient used any form of tobacco in the last 30 days? (Cigarettes, Smokeless Tobacco, Cigars, and/or Pipes) Yes, No  Blood Alcohol level:  Lab Results  Component Value Date   ETH <5 69/48/5462    Metabolic Disorder Labs:  No results found for: HGBA1C, MPG No results found for: PROLACTIN No results found for: CHOL, TRIG, HDL, CHOLHDL, VLDL, LDLCALC  See Psychiatric Specialty Exam and Suicide Risk Assessment completed by Attending Physician prior to discharge.  Discharge destination:  Home  Is patient on multiple  antipsychotic therapies at discharge:  No   Has Patient had three or more failed trials of antipsychotic monotherapy by history:  No  Recommended Plan for Multiple Antipsychotic Therapies: NA  Discharge Instructions    Diet - low sodium heart healthy    Complete by:  As directed    Increase activity slowly    Complete by:  As directed      Allergies as of 11/19/2016   No Known Allergies     Medication List    STOP taking these medications   FARXIGA 10 MG Tabs tablet Generic drug:  dapagliflozin propanediol     TAKE these medications     Indication  buPROPion 300 MG 24 hr tablet Commonly known as:  WELLBUTRIN XL Take 1 tablet (300 mg total) by mouth daily. Start taking on:  11/20/2016  Indication:  Major Depressive Disorder   BYDUREON 2 MG Pen Generic drug:  Exenatide ER Inject 2 mg into the skin once a week.  Indication:  Type 2 Diabetes   gabapentin 300 MG capsule Commonly known as:  NEURONTIN Take 1 capsule by mouth 3 (three) times daily.  Indication:  Neurogenic Pain   hydrOXYzine 50 MG tablet Commonly known as:  ATARAX/VISTARIL Take 1 tablet (50 mg total) by mouth 3 (three) times daily as needed for anxiety (sleep).  Indication:  Anxiety Neurosis   lamoTRIgine 100 MG tablet Commonly known as:  LAMICTAL Take 1 tablet (100 mg total) by mouth at bedtime.  Indication:  Depression   metFORMIN 500 MG 24 hr tablet Commonly known as:  GLUCOPHAGE-XR Take 1,000 mg by mouth 2 (two) times daily.  Indication:  Type 2 Diabetes   sertraline 100 MG tablet Commonly known as:  ZOLOFT Take 1 tablet (100 mg total) by mouth at bedtime.  Indication:  Major Depressive Disorder      Follow-up Information    [facility]. Go on 11/20/2016.   Why:  Appointment on this date at 1:30pm. Please bring Photo I.D. and insurance card information.  Contact information: [address] [place], [address] [phone]           Follow-up recommendations:  Activity:  As tolerated. Diet:  Low sodium heart healthy ADA diet. Other:  Keep follow-up appointments.  Comments:    Signed: Orson Slick, MD 11/19/2016, 11:11 AM

## 2016-11-19 NOTE — Progress Notes (Signed)
Provided and reviewed discharge paperwork. Verified understanding by use of teach back method. Verbalizes understanding as well. Denies SI/HI/AVH. Returned pt belongings from pt specific locker as noted. Pt expects her mother to pick her up on discharge. Safety maintained. Pt ready for discharge once mother is here to pick up pt. Will continue to monitor.

## 2016-11-19 NOTE — Progress Notes (Signed)
  [facility] :  Will you be returning to the same living situation after discharge:  No. At discharge, do you have transportation home?: Yes,  mother Do you have the ability to pay for your medications: Yes,  [employer]  Release of information consent forms completed and in the chart;  Patient's signature needed at discharge.  Patient to Follow up at: Follow-up Information    [facility]. Go on 11/20/2016.   Why:  Appointment on this date at 1:30pm. Please bring Photo I.D. and insurance card information.  Contact information: [address], [address] [place], [id number] [phone]          Next level of care provider has [facility]:yes  Safety Planning and Suicide Prevention discussed: Yes,  with Alcario DroughtErica Long  Have you used any form of tobacco in the last 30 days? (Cigarettes, Smokeless Tobacco, Cigars, and/or Pipes): No  Has patient been referred to the Quitline?: N/A patient is not a smoker  Patient has been referred for addiction treatment: N/A  Lorri FrederickWierda, Leean Amezcua Jon, LCSW 11/19/2016, 2:05 PM

## 2016-11-20 ENCOUNTER — Other Ambulatory Visit (HOSPITAL_COMMUNITY): Payer: BLUE CROSS/BLUE SHIELD | Attending: Psychiatry | Admitting: Licensed Clinical Social Worker

## 2016-11-20 DIAGNOSIS — F909 Attention-deficit hyperactivity disorder, unspecified type: Secondary | ICD-10-CM | POA: Diagnosis not present

## 2016-11-20 DIAGNOSIS — F329 Major depressive disorder, single episode, unspecified: Secondary | ICD-10-CM | POA: Diagnosis not present

## 2016-11-20 DIAGNOSIS — Z79899 Other long term (current) drug therapy: Secondary | ICD-10-CM | POA: Insufficient documentation

## 2016-11-20 DIAGNOSIS — R45851 Suicidal ideations: Secondary | ICD-10-CM | POA: Insufficient documentation

## 2016-11-20 DIAGNOSIS — F332 Major depressive disorder, recurrent severe without psychotic features: Secondary | ICD-10-CM
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
## 2016-11-26 ENCOUNTER — Other Ambulatory Visit (HOSPITAL_COMMUNITY): Payer: BLUE CROSS/BLUE SHIELD | Admitting: Licensed Clinical Social Worker

## 2016-11-26 DIAGNOSIS — F332 Major depressive disorder, recurrent severe without psychotic features: Secondary | ICD-10-CM

## 2016-11-26 DIAGNOSIS — F329 Major depressive disorder, single episode, unspecified: Secondary | ICD-10-CM | POA: Diagnosis not present

## 2016-11-26 NOTE — Psych (Signed)
   [facility] BH PHP THERAPIST PROGRESS NOTE  Renee Turner [id number]  Session Time: 9 AM - 2 PM  Participation Level: Active  Behavioral Response: CasualAlert and LethargicDepressed  Type of Therapy: Individual Therapy  Treatment Goals addressed: Coping  Interventions: CBT, DBT, Supportive and Reframing  Summary: Renee Turner is a 21 y.o. female who presents with depression symptoms. Clinician facilitated check-in regarding current stressors and situation, and review of patient completed diary card. Clinician utilized active listening and empathetic response and validated patient emotions. Clinician facilitated discussion on family and goals. Clinician introduced topic of needs and led a needs assessment. Clinician assessed for immediate needs, medication compliance and efficacy, and safety concerns.    Suicidal/Homicidal: Nowithout intent/plan  Therapist Response: Patient arrived within time allowed and reports she is feeling "okay." Patient rates her mood at a 6 on a 1- 10 scale with 10 being great. Patient reports having a "cloudy" mind at times and often takes time to answer questions, reports difficulty concentrating. Patient engaged in activity and discussion. Patient identified areas of her life in which she would like to improve and areas in which she feels things are controlled. Patient demonstrates some progress as evidenced by active participation. This is patient's first session. Patient denies SI/HI/self-harm thoughts   Plan: Patient will continue in PHP and medication management. Work towards decreasing depression symptoms and increase emotional regulation and positive coping skills.    Diagnosis: Severe episode of recurrent major depressive disorder, without psychotic features ([place]) [F33.2]    1. Severe episode of recurrent major depressive disorder, without psychotic features ([place])       Lorin Glass, LCSW 11/26/2016

## 2016-11-27 ENCOUNTER — Other Ambulatory Visit (HOSPITAL_COMMUNITY): Payer: BLUE CROSS/BLUE SHIELD | Admitting: Occupational Therapy

## 2016-11-27 ENCOUNTER — Other Ambulatory Visit (HOSPITAL_COMMUNITY): Payer: BLUE CROSS/BLUE SHIELD | Admitting: Licensed Clinical Social Worker

## 2016-11-27 ENCOUNTER — Encounter (HOSPITAL_COMMUNITY): Payer: Self-pay | Admitting: Occupational Therapy

## 2016-11-27 ENCOUNTER — Encounter (HOSPITAL_COMMUNITY): Payer: Self-pay | Admitting: Psychiatry

## 2016-11-27 DIAGNOSIS — F332 Major depressive disorder, recurrent severe without psychotic features: Secondary | ICD-10-CM

## 2016-11-27 DIAGNOSIS — F329 Major depressive disorder, single episode, unspecified: Secondary | ICD-10-CM | POA: Diagnosis not present

## 2016-11-27 NOTE — Therapy (Signed)
[place], [place], [address] Phone: [phone]   Fax:  [phone]  Occupational Therapy Evaluation  Patient Details  Name: Renee Turner MRN: [id number] Date of Birth: May 05, 1996 Referring Provider: Dr. Neita Turner  Encounter Date: 11/27/2016      OT End of Session - 11/27/16 1405    Visit Number 1   Number of Visits 6   Date for OT Re-Evaluation 12/21/16   Authorization Type BCBS   OT Start Time 1030   OT Stop Time 1134   OT Time Calculation (min) 64 min   Activity Tolerance Patient tolerated treatment well   Behavior During Therapy [facility] for tasks assessed/performed      Past Medical History:  Diagnosis Date  . Anxiety   . Depression   . Diabetes mellitus without complication ([place])    Type II    No past surgical history on file.  There were no vitals filed for this visit.      Subjective Assessment - 11/27/16 1404    Currently in Pain? No/denies           [facility] OT Assessment - 11/27/16 1404      Assessment   Diagnosis Major Depressive Disorder   Referring Provider Dr. Felicita Gage Turner   Onset Date --  Chronic     Balance Screen   Has the patient fallen in the past 6 months No   Has the patient had a decrease in activity level because of a fear of falling?  No   Is the patient reluctant to leave their home because of a fear of falling?  No       OT assessment Diagnosis: major depressive disorder, depression, PTSD Past medical history: DM II, MDD, PTSD    Living situation: With parents, 4 sisters, and 56 other family members; is moving in with close friend and friend's Mother soon ADLs: independent Work: CNA at [employer]; wants to study nursing Leisure: action movies, reading Social support: small group of friends Struggles: communicating with people, sleep, coping skills OT goal: learning coping strategies.  General Causality Orientation Scale    Subscore Percentile  Score  Autonomy 61 74.50  Control 39 15.30  Impersonal 46 43.63    Motivation Type  Motivation type Explanation   Autonomy-oriented The individual is clear about what he or she is doing.  There is clear connection between behavior and interest/personal goals.  Motivation is intact.  Assessment:  Patient demonstrates autonomy motivation type.  Patient will benefit from occupational therapy intervention in order to improve time management, financial management, stress management, job readiness skills, social skills, sleep hygiene, exercise and healthy eating habits,  and health management skills and other psychosocial skills needed for preparation to return to full time community living and to be a productive community member.   Plan:  Patient will participate in skilled occupational therapy sessions individually or in a group setting to improve coping skills, psychosocial skills, and emotional skills required to return to prior level of function as a productive community member. Treatment will be 1-2 times per week for 2-6 weeks.       OT Group: Social and Armed forces logistics/support/administrative officer   S:  "I filled out all the letters on that paper."   O:  Patient actively participated in the following skilled occupational therapy treatment session this date:             Social and communication skills: Renee Turner participated in discussion of  social and communication skills including the importance of social skills, what she is good at and what she needs to improve on. Renee Turner engaged in discussion on conversation starters identifying 5 potential ideas for beginning a conversation. Also engaged in discussion on body language and what is portrayed via various gestures and postures. Renee Turner shared what communication skills are strengths and what are weaknesses for her. Discussed variables that affect self-esteem. Finally engaged in discussion of assertiveness including confident behavior and reviewed the traits of passive,  assertive, and aggressive individuals.    A:  Patient participated in skilled occupational therapy group for social and communication skills this date.  Patient was engaged and open to ideas and strategies introduced.  Pt does not feel very confident in her communication skills, and feels she has fairly medium self-esteem, which she would like to improve, and does desire to improve her communication skills for future interactions.     P:  Continue participation in skilled occupational therapy groups  1-2 times per week for 2 weeks in order to gain the necessary skills needed to return to full time community living and learn effective coping strategies to be a productive community resident. Follow up on HEP social and communication skills. Review self-esteem worksheet sent for HEP.         OT Short Term Goals - 11/27/16 1407      OT SHORT TERM GOAL #1   Title Patient will be educated on strategies to improve psychosocial skills needed to participate fully in all daily, work, and leisure activities.   Time 3   Period Weeks   Status New     OT SHORT TERM GOAL #2   Title Patient will be educated on a HEP and independent with implementation of HEP.     Time 3   Period Weeks   Status New     OT SHORT TERM GOAL #3   Title Patient will independently apply psychosocial skills and coping mechanisms to her daily activities in order to function independently.    Time 3   Period Weeks   Status New                  Plan - 11/27/16 1405    Rehab Potential Good   OT Frequency 2x / week   OT Duration --  3 weeks   OT Treatment/Interventions Self-care/ADL training;Patient/family education  community reintegration, psychosocial skill development, coping skills development   Consulted and Agree with Plan of Care Patient      Patient will benefit from skilled therapeutic intervention in order to improve the following deficits and impairments:  Other (comment) (decreased coping skills,  decreased psychosocial skills)  Visit Diagnosis: Severe episode of recurrent major depressive disorder, without psychotic features ([place])    Problem List Patient Active Problem List   Diagnosis Date Noted  . Posttraumatic stress disorder 11/13/2016  . Major depressive disorder, recurrent severe without psychotic features ([place]) 11/07/2016  . Diabetes ([place]) 11/07/2016    Guadelupe Sabin, OTR/L  [phone] 11/27/2016, 2:18 PM  [facility] PARTIAL HOSPITALIZATION PROGRAM [place], [place], [address] Phone: [phone]   Fax:  [phone]  Name: Renee Turner MRN: [id number] Date of Birth: 03/08/1996

## 2016-11-27 NOTE — Psych (Signed)
   [facility] BH PHP THERAPIST PROGRESS NOTE  Renee Turner [id number]  Session Time: 9 AM - 2 PM  Participation Level: Active  Behavioral Response: CasualAlert and LethargicDepressed  Type of Therapy: Individual Therapy  Treatment Goals addressed: Coping  Interventions: CBT, DBT, Supportive and Reframing  Summary: Renee Turner is a 21 y.o. female who presents with depression symptoms. Clinician facilitated check-in regarding current stressors and situation, and review of patient completed diary card. Clinician utilized active listening and empathetic response and validated patient emotions. Clinician facilitated discussion on independence and trauma. Clinician introduced topic of assertiveness and communication styles. Clinician assessed for immediate needs, medication compliance and efficacy, and safety concerns.    Suicidal/Homicidal: Nowithout intent/plan  Therapist Response: Patient arrived within time allowed and reports she is feeling "okay." Patient rates her mood at a 6 on a 1- 10 scale with 10 being great. Patient engaged in activity and discussion. Patient reports understanding of topic discussed and identified ways in which she could increase assertiveness. Patient demonstrates some progress as evidenced by increased sharing. Patient denies SI/HI/self-harm thoughts   Plan: Patient will continue in PHP and medication management. Work towards decreasing depression symptoms and increase emotional regulation and positive coping skills.    Diagnosis: Severe episode of recurrent major depressive disorder, without psychotic features ([place]) [F33.2]    1. Severe episode of recurrent major depressive disorder, without psychotic features ([place])       Lorin Glass, LCSW 11/27/2016
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
## 2016-11-28 ENCOUNTER — Other Ambulatory Visit (HOSPITAL_COMMUNITY): Payer: BLUE CROSS/BLUE SHIELD | Admitting: Licensed Clinical Social Worker

## 2016-11-28 DIAGNOSIS — F329 Major depressive disorder, single episode, unspecified: Secondary | ICD-10-CM | POA: Diagnosis not present

## 2016-11-28 DIAGNOSIS — F332 Major depressive disorder, recurrent severe without psychotic features: Secondary | ICD-10-CM

## 2016-11-28 NOTE — Psych (Signed)
   [facility] PHP THERAPIST PROGRESS NOTE  Renee Turner [id number]  Session Time: 9 AM - 2 PM  Participation Level: Active  Behavioral Response: CasualAlert and LethargicDepressed  Type of Therapy: Individual Therapy  Treatment Goals addressed: Coping  Interventions: CBT, DBT, Supportive and Reframing  Summary: [name] is a 21 y.o. female who presents with depression symptoms. Clinician facilitated check-in regarding current stressors and situation, and review of patient completed diary card. Clinician utilized active listening and empathetic response and validated patient emotions. Clinician facilitated discussion on moving out and organization. Clinician introduced topic of distress tolerance skills; reviewing STOP, TIP, ACCEPTS, and self-soothe. Clinician led art therapy activity on the future. Clinician assessed for immediate needs, medication compliance and efficacy, and safety concerns.    Suicidal/Homicidal: Nowithout intent/plan  Therapist Response: Patient arrived within time allowed and reports she is feeling "okay." Patient rates her mood at a 4 on a 1- 10 scale with 10 being great, stating she did not sleep well last night. Patient engaged in activity and discussion. Patient reports understanding of topic discussed and identified ways in which she could utilize the skills and which seem most applicable to her life.. Patient demonstrates some progress as evidenced by reporting forward momentum in scheduling appointments and achieving future goals. Patient denies SI/HI/self-harm thoughts   Plan: Patient will continue in PHP and medication management. Work towards decreasing depression symptoms and increase emotional regulation and positive coping skills.    Diagnosis: Severe episode of recurrent major depressive disorder, without psychotic features ([place]) [F33.2]    1. Severe episode of recurrent major depressive disorder, without psychotic features ([place])       Lorin Glass, LCSW 11/28/2016

## 2016-11-29 ENCOUNTER — Other Ambulatory Visit (HOSPITAL_COMMUNITY): Payer: BLUE CROSS/BLUE SHIELD | Admitting: Licensed Clinical Social Worker

## 2016-11-29 DIAGNOSIS — F332 Major depressive disorder, recurrent severe without psychotic features: Secondary | ICD-10-CM

## 2016-11-29 DIAGNOSIS — F329 Major depressive disorder, single episode, unspecified: Secondary | ICD-10-CM | POA: Diagnosis not present

## 2016-11-29 NOTE — Psych (Signed)
   [facility] PHP THERAPIST PROGRESS NOTE  Renee Turner [id number]  Session Time: 9 AM - 2 PM  Participation Level: Active  Behavioral Response: CasualAlert and LethargicDepressed  Type of Therapy: Individual Therapy  Treatment Goals addressed: Coping  Interventions: CBT, DBT, Supportive and Reframing  Summary:  Clinician facilitated check-in regarding current stressors and situation, and review of patient completed diary card. Clinician utilized active listening and empathetic response and validated patient emotions. Clinician facilitated discussion on sleep hygiene. Clinician introduced topic of emotion regulation and feelings. Clinician led art therapy activity on the future. Clinician assessed for immediate needs, medication compliance and efficacy, and safety concerns.    Suicidal/Homicidal: Nowithout intent/plan  Therapist Response: [name] is a 21 y.o. female who presents with depression symptoms. Patient arrived within time allowed and reports she is feeling "okay." Patient rates her mood at a 5 on a 1- 10 scale with 10 being great. Patient engaged in activity and discussion. Patient reports understanding of topic discussed. Patient demonstrates some progress as evidenced by voicing desires and looking forward. Patient continues to be non-talkative and have problems concentrating. Patient denies SI/HI/self-harm thoughts   Plan: Patient will continue in PHP and medication management. Work towards decreasing depression symptoms and increase emotional regulation and positive coping skills.    Diagnosis: Severe recurrent major depression without psychotic features ([place]) [F33.2]    1. Severe recurrent major depression without psychotic features [facility])       Lorin Glass, LCSW 11/29/2016

## 2016-11-29 NOTE — Psych (Signed)
  [facility] Partial Hospitalization Program Psych Discharge Summary  Renee Turner [id number]  Admission date: 11/26/2016 Discharge date: 11/30/2016  Reason for admission: step down from inpatient admission   Progress in Program Toward Treatment Goals: [name] was doing better after discharge and has continued doing better after a week in the [facility].  She is not suicidal and is optimistic that she will be okay after discharge.  She plans to move I with a friend and is looking forward to that as it will solve one of her biggest stressors.   Progress (rationale): was the only one in group and was helped she said by having somebody to talk to about things she would normally keep to herself.  She remains very quiet and fairly non talkative  Discharge Plan: Referral to Psychiatrist and Referral to Counselor/Psychotherapist    Donnelly Angelica, MD 11/29/2016

## 2016-11-30 ENCOUNTER — Other Ambulatory Visit (HOSPITAL_COMMUNITY): Payer: BLUE CROSS/BLUE SHIELD | Admitting: Licensed Clinical Social Worker

## 2016-11-30 ENCOUNTER — Encounter (HOSPITAL_COMMUNITY): Payer: Self-pay

## 2016-11-30 VITALS — BP 114/80 | HR 76 | Ht 66.75 in | Wt 203.0 lb

## 2016-11-30 DIAGNOSIS — F332 Major depressive disorder, recurrent severe without psychotic features: Secondary | ICD-10-CM

## 2016-11-30 DIAGNOSIS — F329 Major depressive disorder, single episode, unspecified: Secondary | ICD-10-CM | POA: Diagnosis not present

## 2016-11-30 NOTE — Progress Notes (Signed)
Patient in for Partial Hospitalization Program today with initial plan to discharge today.  Patient presented with flat affect, depressed mood and acknowledged she was "worse" today than when in group 11/29/16.  Patient with limited verbalization of status initially but during assessment began to verbally answer questions more and stated she had been having some suicidal thoughts but denied any plans or intent to harm self at this time.  Patient admitted she had not been sleeping as well over the past 2 days, about 6 hours each day, and ambivalent about ending of service with PHP.  Patient reported depression a 7, anxiety a 5 and hopelessness a 5 on a scale of 0-10 today with 0 being none and 10 the most she could experience.  Patient discussed a crisis plan for the upcoming weekend if thoughts worsened and stated plan back to this nurse after time spent together, as she stated she would "come to the hospital" if began to have any plans or intentions to harm self.  Patient denied any plans to harm others and stated plan to stay with parents over the coming weekend [place].  Patient agreed to take Hydroxyzine 50 mg, one at bedtime to assist with sleep and reminded could take up to 3 times a day per order so if awoke and needed to take the medication early in the morning and feeling anxious she could do this.  Patient stated understanding and reviewed plan again to finalize for the weekend.  Patient denies any problems with appetite and discussed with patient and Donia GuilesJenny Edminson, LCSW plan for [facility] and to reassess in the coming week.  Patient in agreement with this plan and verbalized this is what she thought was best.  Patient denied any pain, discussed her status with reported compliance with all medications, managing type II diabetes that she told this nurse about and returned patient to group.  Patient agreed to inform Donia GuilesJenny Edminson, LCSW if she started to have any worsening of  symptoms today, if began to think about harming self or others as denies at this time and reviewed plan for the weekend. Plan: Patient to [facility] or to come in for an emergency evaluation if started to have thoughts of wanting to harm self or others, if began to have any thoughts on how she would do this or if began to want to do this as denies any intention to harm self at this time.  Patient reminded she could also go to any emergency or call 911.  Patient stable at this time and [facility] as continues to manage increased symptoms of depression.  Reviewed all medications and patient denies any side effects and states daily compliance.  Reminded if ever develops a rash then to stop medication and to contact her provider immediately due to concerns for Trudie BucklerSteven Johnson Syndrome.  Patient denies any pain, no shortness of breath, and no irregular heart or lung sounds with nursing assessment.  Patient to complete a final check in this date with Donia GuilesJenny Edminson, LCSW prior to leaving PHP to ensure continued safety and stability.

## 2016-11-30 NOTE — Psych (Signed)
   [facility] PHP THERAPIST PROGRESS NOTE  Renee Turner [id number]  Session Time: 9 AM - 1 PM  Participation Level: Active  Behavioral Response: CasualConfused and LethargicDepressed  Type of Therapy: Individual Therapy  Treatment Goals addressed: Coping  Interventions: CBT, DBT, Supportive and Reframing  Summary:  Clinician facilitated check-in regarding current stressors and situation, and review of patient completed diary card. Clinician utilized active listening and empathetic response and validated patient emotions. While discussing desire to move to a new living situation, pt fell silent and stopped responding to cln. Pt was silent for approximately 45 minutes. Pt exhibited poor eye contact. Pt kept her eyes on the floor and would look at cln when cln spoke and then return sight to the floor. Clinician attempted to engage pt and encouraged her to speak. Cln attempted to assess for AVH and SI. Pt remained unresponsive except for 2 shakes of the head denying A/VH. RN was called to assess patient.  Pt returned to group and began to speak again slowly. Pt remained less talkative than typical. Clinician facilitated discussion on positive psychology. Clinician assessed for immediate needs, medication compliance and efficacy, and safety concerns. Clinician reviewed safety plan created with RN.    Suicidal/Homicidal: Yeswithout intent/plan  Therapist Response: Renee Turner is a 21 y.o. female who presents with depression symptoms. Patient arrived within time allowed and reports she is feeling "okay." Patient rates her mood at a 6 on a 1- 10 scale with 10 being great. Patient had minimal participation. Pt would not address why she stopped speaking or what precipitated the change in mood. Patient reports understanding of topic discussed. Patient demonstrates lack of progress as evidenced by lack of communication and report of increased SI. Patient denies active SI/HI and states she will call crisis line  or go to the hospital should the increase.    Plan: Patient was scheduled for discharge today, however with RN and pt input, this is postponed due to escalation of symptoms today. Patient will continue in PHP and medication management. Work towards decreasing depression symptoms and increase emotional regulation and positive coping skills.    Diagnosis: Severe recurrent major depression without psychotic features ([place]) [F33.2]    1. Severe recurrent major depression without psychotic features [facility])       Lorin Glass, LCSW 11/30/2016

## 2016-12-03 ENCOUNTER — Other Ambulatory Visit (HOSPITAL_COMMUNITY): Payer: BLUE CROSS/BLUE SHIELD | Admitting: Licensed Clinical Social Worker

## 2016-12-03 DIAGNOSIS — F329 Major depressive disorder, single episode, unspecified: Secondary | ICD-10-CM | POA: Diagnosis not present

## 2016-12-03 DIAGNOSIS — F332 Major depressive disorder, recurrent severe without psychotic features: Secondary | ICD-10-CM

## 2016-12-03 NOTE — Psych (Signed)
   [facility] PHP THERAPIST PROGRESS NOTE  Arienna Benegas [id number]  Session Time: 9 AM - 2 PM  Participation Level: Minimal  Behavioral Response: CasualAlert and LethargicDepressed  Type of Therapy: Individual Therapy  Treatment Goals addressed: Coping  Interventions: CBT, DBT, Supportive and Reframing  Summary:  Clinician facilitated check-in regarding current stressors and situation, and review of patient completed diary card. Clinician utilized active listening and empathetic response and validated patient emotions. Clinician facilitated discussion on boundaries; what they are, how to know if they've been violated, and how to set them. Clinician assessed for immediate needs, medication compliance and efficacy, and safety concerns.    Suicidal/Homicidal: Yeswithout intent/plan  Therapist Response: Janely Gullickson is a 21 y.o. female who presents with depression symptoms. Patient arrived within time allowed and reports she is feeling "okay." Patient rates her mood at a 4 on a 1- 10 scale with 10 being great. Patient had minimal participation, however was more talkative than in past days. Patient reports understanding of topic discussed and was able to give examples from her own life. Patient demonstrates some progress as evidenced by increased verbalization and participation. Patient denies active SI/HI, however states thoughts about death, and states she will call crisis line or go to the hospital should the increase.    Plan: Patient will continue in PHP and medication management. Work towards decreasing depression symptoms and increase emotional regulation and positive coping skills.    Diagnosis: Severe recurrent major depression without psychotic features ([place]) [F33.2]    1. Severe recurrent major depression without psychotic features [facility])       Lorin Glass, LCSW 12/03/2016

## 2016-12-03 NOTE — Progress Notes (Signed)
Met with patient today to follow up on mood and recent increased suicidal ideations.  Patient presented with flat affect, depressed mood but did smile more today and initiated some conversation.  Patient reported still some sedation during the day after taking Hydroxyzine 50 mg in the morning.  States taking twice a day currently as also takes prior to sleep.  Patient denies any current suicidal or homicidal ideations, no plan or intent.  Patient reported depression a 5, anxiety a 2 and hopelessness a 3 today on a scale of 0-10 with 0 being none and 10 the most she could experience.  Patient stated liking having another young adult in group today as stated "I don't have to talk as much".  Patient admitted to feeling some better today and that she was anxious more on 11/30/16 as PHP was ending.  Patient reported sleeping a lot over the weekend and felt "lethargic" on Sunday 12/02/16.  Agreed to follow up with Dr. Lovena Le on 12/04/16 as patient may need some adjustment in medications due to concern hydroxyzine 50 mg dosage may be too much during the day.  Patient agreed with plan and stated doing okay at present and not too sedated.  States understanding not to take when driving due to sedation concern.  Patient to contact this nurse as needed and will see again on 12/04/16 to follow up with physician when he is here regarding medication.

## 2016-12-04 ENCOUNTER — Other Ambulatory Visit (HOSPITAL_COMMUNITY): Payer: BLUE CROSS/BLUE SHIELD | Admitting: Licensed Clinical Social Worker

## 2016-12-04 ENCOUNTER — Other Ambulatory Visit (HOSPITAL_COMMUNITY): Payer: BLUE CROSS/BLUE SHIELD | Admitting: Occupational Therapy

## 2016-12-04 ENCOUNTER — Encounter (HOSPITAL_COMMUNITY): Payer: Self-pay | Admitting: Occupational Therapy

## 2016-12-04 DIAGNOSIS — F332 Major depressive disorder, recurrent severe without psychotic features: Secondary | ICD-10-CM

## 2016-12-04 DIAGNOSIS — F329 Major depressive disorder, single episode, unspecified: Secondary | ICD-10-CM | POA: Diagnosis not present

## 2016-12-04 MED ORDER — AMPHETAMINE-DEXTROAMPHETAMINE 10 MG PO TABS: 10.0000 mg | ORAL_TABLET | Freq: Every day | ORAL | 0 refills | Status: DC

## 2016-12-04 NOTE — Therapy (Signed)
[place], [place], [address] Phone: [phone]   Fax:  [phone]  Occupational Therapy Treatment  Patient Details  Name: Renee Turner MRN: [id number] Date of Birth: 1995/11/14 Referring Provider: Dr. Neita Garnet  Encounter Date: 12/04/2016      OT End of Session - 12/04/16 1627    Visit Number 2   Number of Visits 6   Date for OT Re-Evaluation 12/21/16   Authorization Type [employer]   OT Start Time 1030   OT Stop Time 1145   OT Time Calculation (min) 75 min   Activity Tolerance Patient tolerated treatment well   Behavior During Therapy [facility] for tasks assessed/performed      Past Medical History:  Diagnosis Date  . Anxiety   . Depression   . Diabetes mellitus without complication ([place])    Type II  . Diabetes mellitus, type II ([place])     No past surgical history on file.  There were no vitals filed for this visit.      Subjective Assessment - 12/04/16 1627    Currently in Pain? No/denies            [facility] OT Assessment - 12/04/16 1626      Assessment   Diagnosis Major Depressive Disorder     Precautions   Precautions None       OT Treatment Group: Sleep Hygiene   S:  "I get maybe 6 hours of sleep a night."  O:  Patient actively participated in the following skilled occupational therapy treatment session this date: Sleep hygiene - discussed ideal amount of sleep one needs, Grady's current sleep schedule and ideal schedule. She currently gets approximately 6 hours of sleep per night, sometimes less, wakes up during the night. Discussed reasons current sleep schedule is not ideal for her health and wellness. Discussed developing a routine for leading up to going to bed to promote falling asleep quickly and remaining asleep.  Patient remained engaged and focused during session.  Antonietta Breach participated in group discussion of factors that interfere with sleep and lifestyle factors  promoting sleep. Also discussed moving to a different environment if trying to remain awake. Discussed importance of exercise for the body's metabolism and promoting healthy sleep. A:  Patient participated in skilled occupational therapy group for sleep hygiene skills this date.  Patient was engaged and appears open to strategies introduced.  Patient is getting a varied amount of sleep each night, sometimes including daily naps ranging in length and time of day.  Discussed strategies to make environment less conducive to sleep during daylight hours, managing diet, and developing an exercise schedule.  P:  Continue participation in skilled occupational therapy groups  1-2 times per week in order to gain the necessary skills needed to return to full time community living and learn effective coping strategies to be a productive community resident. Follow up on HEP for developing a evening/night-time routine and finding a daily exercise outlet of moderate intensity.          OT Short Term Goals - 12/04/16 1627      OT SHORT TERM GOAL #1   Title Patient will be educated on strategies to improve psychosocial skills needed to participate fully in all daily, work, and leisure activities.   Time 3   Period Weeks   Status On-going     OT SHORT TERM GOAL #2   Title Patient will be educated on a HEP and independent with  implementation of HEP.     Time 3   Period Weeks   Status On-going     OT SHORT TERM GOAL #3   Title Patient will independently apply psychosocial skills and coping mechanisms to her daily activities in order to function independently.    Time 3   Period Weeks   Status On-going                  Plan - 12/04/16 1627    Rehab Potential Good   OT Frequency 2x / week   OT Duration --  3 weeks   OT Treatment/Interventions Self-care/ADL training;Patient/family education  community reintegration, psychosocial skill development, coping skills development   Consulted and  Agree with Plan of Care Patient      Patient will benefit from skilled therapeutic intervention in order to improve the following deficits and impairments:  Other (comment) (decreased coping skills, decreased psychosocial skills)  Visit Diagnosis: Severe recurrent major depression without psychotic features [facility])    Problem List Patient Active Problem List   Diagnosis Date Noted  . Posttraumatic stress disorder 11/13/2016  . Major depressive disorder, recurrent severe without psychotic features ([place]) 11/07/2016  . Diabetes ([place]) 11/07/2016   Guadelupe Sabin, OTR/L  [phone] 12/04/2016, 4:28 PM  [facility] PARTIAL HOSPITALIZATION PROGRAM [place], [place], [address] Phone: [phone]   Fax:  [phone]  Name: Jazzman Loughmiller MRN: [id number] Date of Birth: 11/21/1995

## 2016-12-04 NOTE — Psych (Signed)
   [facility] PHP THERAPIST PROGRESS NOTE  Icesis Renn [id number]  Session Time: 9 AM - 2 PM  Participation Level: Minimal  Behavioral Response: CasualAlert and LethargicDepressed  Type of Therapy: Group Therapy  Treatment Goals addressed: Coping  Interventions: CBT, DBT, Supportive and Reframing  Summary:  Clinician facilitated check-in regarding current stressors and situation, and review of patient completed diary card. Clinician utilized active listening and empathetic response and validated patient emotions. Clinician facilitated discussion on control, limits, and the future. Clinician continued topic of boundaries and how they present. Clinician assessed for immediate needs, medication compliance and efficacy, and safety concerns.    Suicidal/Homicidal: Yeswithout intent/plan  Therapist Response: Starlyn Droge is a 21 y.o. female who presents with depression symptoms. Patient arrived within time allowed and reports she is feeling "good." Patient rates her mood at a 8 on a 1- 10 scale with 10 being great. Patient states she has found out she can adopt a kitten and that it has improved her outlook. Patient had minimal participation, however had brightened affect and smiled multiple times in session.  Patient demonstrates some progress as evidenced by self-report of increased mood and verbalizing positive thoughts about the future. Patient denies SI/HI.     Plan: Patient will continue in PHP and medication management. Work towards decreasing depression symptoms and increase emotional regulation and positive coping skills.    Diagnosis: Severe recurrent major depression without psychotic features ([place]) [F33.2]    1. Severe recurrent major depression without psychotic features [facility])       Lorin Glass, LCSW 12/04/2016

## 2016-12-04 NOTE — Progress Notes (Signed)
Discharge was cancelled due to the presence of suicidal thoughts returning the day of the planned discharge.  She says she believes the thoughts returned after her father would not let the car title be turned over to her as he does not think she is financially secure enough to handle all the costs.  Today she is not suicidal but believes a few more days might be useful.  She is so quiet and non communicative that it is hard to know what she really thinks.  Nevertheless she is talking more positively today and still looking forward to moving in with her friend and returning to work and eventually to school. We talked about sleep.  She cannot get to sleep at times but mostly wakes up and cannot get back to sleep.  She asked about ADHD diagnosis.  She meets the criteria but it is hard to separate it from her current depression.  She says she had the focus issues before she was this depressed.  She wants to try something for her focus.  Plan:  Will try Adderall 10 mg for ADHD and it should be a good augmentation for the antidepressant as well.  Stop the hydroxyzine during the day as it seems to do nothing she says.  Take 100 mg hydroxyzine hs to help with sleep.  Still anticipate discharge on 2 Feb

## 2016-12-05 ENCOUNTER — Other Ambulatory Visit (HOSPITAL_COMMUNITY): Payer: BLUE CROSS/BLUE SHIELD | Admitting: Licensed Clinical Social Worker

## 2016-12-05 DIAGNOSIS — F329 Major depressive disorder, single episode, unspecified: Secondary | ICD-10-CM | POA: Diagnosis not present

## 2016-12-05 DIAGNOSIS — F332 Major depressive disorder, recurrent severe without psychotic features: Secondary | ICD-10-CM

## 2016-12-05 NOTE — Psych (Signed)
   [facility] PHP THERAPIST PROGRESS NOTE  Renee Turner [id number]  Session Time: 9 AM - 2 PM  Participation Level: Minimal  Behavioral Response: CasualAlert and LethargicDepressed  Type of Therapy: Group Therapy  Treatment Goals addressed: Coping  Interventions: CBT, DBT, Supportive and Reframing  Summary:  Clinician facilitated check-in regarding current stressors and situation, and review of patient completed diary card. Clinician utilized active listening and empathetic response and validated patient emotions. Clinician facilitated discussion on relationships and rumination. Clinician introduced topic of dialectics and radical acceptance. Clinician assessed for immediate needs, medication compliance and efficacy, and safety concerns.     Suicidal/Homicidal: Yeswithout intent/plan  Therapist Response: [name] is a 21 y.o. female who presents with depression symptoms. Patient arrived within time allowed and reports she is feeling "okay." Patient rates her mood at a 6 on a 1- 10 scale with 10 being great. Patient had increased participation and continued to have increased facial response. Patient reports understanding of topics and how this can help her life. Patient demonstrates some progress as evidenced by increased focus and report of positive coping skills at home. Patient denies SI/HI.     Plan: Patient will continue in PHP and medication management. Work towards decreasing depression symptoms and increase emotional regulation and positive coping skills.    Diagnosis: Severe recurrent major depression without psychotic features ([place]) [F33.2]    1. Severe recurrent major depression without psychotic features [facility])       Lorin Glass, LCSW 12/05/2016

## 2016-12-06 ENCOUNTER — Other Ambulatory Visit (HOSPITAL_COMMUNITY): Payer: BLUE CROSS/BLUE SHIELD | Admitting: Specialist

## 2016-12-06 ENCOUNTER — Other Ambulatory Visit (HOSPITAL_COMMUNITY): Payer: BLUE CROSS/BLUE SHIELD | Attending: Psychiatry | Admitting: Licensed Clinical Social Worker

## 2016-12-06 VITALS — BP 118/80 | HR 68 | Ht 66.0 in | Wt 200.0 lb

## 2016-12-06 DIAGNOSIS — F329 Major depressive disorder, single episode, unspecified: Secondary | ICD-10-CM | POA: Diagnosis not present

## 2016-12-06 DIAGNOSIS — F332 Major depressive disorder, recurrent severe without psychotic features: Secondary | ICD-10-CM

## 2016-12-06 NOTE — Progress Notes (Signed)
Advised to take the Adderall twice daily if needed

## 2016-12-06 NOTE — Progress Notes (Signed)
Met with patient to assess he status with plans to end PHP on 12/07/16 and she is also reporting plans to get her own place with a girlfriend she has known for a long time.  States she is excited but anxious about the move.  Reports her depression is now a 0, anxiety a 9 and hopelessness a 2 or 3 on a scale of 0-10 with 0 being none and 10 being the most she could experience.  Patient with brighter affect, pleasant mood and reported increased anxiety related to being somewhat nervious about moving out on her own from her parents but admits she is excited about the move.  Patient denies any suicidal or homicidal ideations, no thoughts to harm self or others and admits to thinking she is being able to concentrate better in group and on tasks since starting Adderall the past 2 days.  Patient to later follow up with Dr. Adele Schilder for medication management and will call this nurse if any problems before she returns to see Dr. Adele Schilder on 12/28/16 for their first visit.  Patient also plans to see a counselor at [facility] and reported no current reservations about ending PHP on 12/07/16.  Patient stable at this time and will call if any problems.  Patient denies need for any medications at this time.

## 2016-12-06 NOTE — Psych (Signed)
  [facility] Partial Hospitalization Program Psych Discharge Summary  Renee Turner [id number]  Admission date: 11/26/2016 Discharge date: 12/07/2016  Reason for admission: depression  Progress in Program Toward Treatment Goals: stayed an extra week and that time seemed useful.  We started Adderall 10 mg yesterday and it has helped her be more talkative.  She says she notices that she can answer questions faster.  Her thoughts seemed to be faster.  Group leader reported the same.  She move in with her friend tomorrow.  No suicidal thoughts at all.   Progress (rationale): medicine seems to help.  Talking seems to be of some help.  Hard to know as she is so tight with revealing anything  Discharge Plan: Referral to Psychiatrist and Referral to Counselor/Psychotherapist    Donnelly Angelica, MD 12/06/2016

## 2016-12-06 NOTE — Therapy (Signed)
[facility] [place], [place], [address] Phone: [phone]   Fax:  [phone]  Occupational Therapy Treatment  Patient Details  Name: Rajanae Mantia MRN: [id number] Date of Birth: [birth year] Referring Provider: Dr. Neita Garnet  Encounter Date: 12/06/2016      OT End of Session - 12/06/16 1427    Visit Number 3   Number of Visits 6   Date for OT Re-Evaluation 12/21/16   Authorization Type [employer]   OT Start Time 1030   OT Stop Time 1128   OT Time Calculation (min) 58 min   Activity Tolerance Patient tolerated treatment well   Behavior During Therapy [facility] for tasks assessed/performed      Past Medical History:  Diagnosis Date  . Anxiety   . Depression   . Diabetes mellitus without complication ([place])    Type II  . Diabetes mellitus, type II ([place])     No past surgical history on file.  There were no vitals filed for this visit. S: I like to walk.  Patient denies pain this date.  O:  Patient actively participated in the following skilled occupational therapy group this date: Exercise and healthy eating:  Group focused on benefits of exercise, contraindiciations, ways to exercise at home, area exercise ideas and completion of gentle stretching and yoga.   Patient remained focused and engaged in group. A:  Patient participated in skilled occupational therapy group for exercise and health eating skills this date.  Patient was engaged.  Patient states that she does walk sometimes for exercises and that she really enjoys the outdoors when exercising.   P:  Continue participation in skilled occupational therapy groups  1-2 times per week for 3weeks in order to gain the necessary skills needed to return to full time community living and learn effective coping strategies to be a productive community resident.       Vangie Bicker, [place], OTR/L [phone]                          OT Short  Term Goals - 12/06/16 1428      OT SHORT TERM GOAL #1   Title Patient will be educated on strategies to improve psychosocial skills needed to participate fully in all daily, work, and leisure activities.   Time 3   Period Weeks   Status Achieved     OT SHORT TERM GOAL #2   Title Patient will be educated on a HEP and independent with implementation of HEP.     Time 3   Period Weeks   Status Achieved     OT SHORT TERM GOAL #3   Title Patient will independently apply psychosocial skills and coping mechanisms to her daily activities in order to function independently.    Time 3   Period Weeks   Status Achieved                  Plan - 12/06/16 1427    OT Treatment/Interventions Self-care/ADL training;Patient/family education   Consulted and Agree with Plan of Care Patient      Patient will benefit from skilled therapeutic intervention in order to improve the following deficits and impairments:   (coping skills, decreased psychosocial skills)  Visit Diagnosis: Severe recurrent major depression without psychotic features ([name])  Severe episode of recurrent major depressive disorder, without psychotic features [facility])    Problem List Patient Active Problem List   Diagnosis  Date Noted  . Posttraumatic stress disorder 11/13/2016  . Major depressive disorder, recurrent severe without psychotic features ([place]) 11/07/2016  . Diabetes ([place]) 11/07/2016    Vangie Bicker, MHA, OTR/L [phone] OCCUPATIONAL THERAPY DISCHARGE SUMMARY  Visits from Start of Care: 3  Current functional level related to goals / functional outcomes: Patient independent in applying learned coping skills to her daily activities.   Remaining deficits: n/a   Education / Equipment: Education on exercise including stretching and yoga and benefits, healthy sleep hygiene.  Plan: Patient agrees to discharge.  Patient goals were partially met. Patient is being discharged due to meeting the  stated rehab goals.  ?????        Vangie Bicker, [place], OTR/L [phone] , 2:28 PM  [address] [place], [place], [address] Phone: [phone]   Fax:  [phone] Problem List    No episode was linked to this visit.     Name: Barbara Ahart MRN: [id number] Date of Birth: May 04, 1996

## 2016-12-07 ENCOUNTER — Other Ambulatory Visit (HOSPITAL_COMMUNITY): Payer: BLUE CROSS/BLUE SHIELD | Admitting: Licensed Clinical Social Worker

## 2016-12-07 DIAGNOSIS — F332 Major depressive disorder, recurrent severe without psychotic features: Secondary | ICD-10-CM

## 2016-12-07 DIAGNOSIS — F329 Major depressive disorder, single episode, unspecified: Secondary | ICD-10-CM | POA: Diagnosis not present

## 2016-12-07 NOTE — Psych (Signed)
   [facility] PHP THERAPIST PROGRESS NOTE  Renee Turner [id number]  Session Time: 9 AM - 1 PM  Participation Level: Minimal  Behavioral Response: CasualAlert and LethargicDepressed  Type of Therapy: Group Therapy  Treatment Goals addressed: Coping  Interventions: CBT, DBT, Supportive and Reframing  Summary:  Clinician facilitated check-in regarding current stressors and situation, and review of patient completed diary card. Clinician utilized active listening and empathetic response and validated patient emotions. Clinician facilitated discussion on topics suggested by patients. Clinician introduced topic of stress and stress management. Clinician assessed for immediate needs, medication compliance and efficacy, and safety concerns.     Suicidal/Homicidal: Nowithout intent/plan  Therapist Response: Renee Turner is a 20 y.o. female who presents with depression symptoms. Patient arrived within time allowed and reports she is feeling "okay." Patient rates her mood at a 8 on a 1- 10 scale with 10 being great. Patient had increased participation and continued to have increased facial response. Patient reports understanding of topic and identified school and family as main stressors and deep breathing as a stress management tool. Patient demonstrates some progress as evidenced by sharing she moved into her new place and is feeling optimistic. Patient denies SI/HI.     Plan: Patient will discharge from PHP due to meeting goals of decreased depression and anxiety symptoms and increased ability to manage emotional regulation and distress tolerance. Pt's improvement is measured by self report and observation. Pt will step down to outpatient therapy and medication management services. Pt has intake appointment at [facility] 12/07/16 at 2 pm; and a psychiatry appt with this agency on 12/20/16 at 9 am.    Diagnosis: Severe recurrent major depression without psychotic features ([name]) [F33.2]    1. Severe  recurrent major depression without psychotic features ([name])        , LCSW 12/07/2016 

## 2016-12-07 NOTE — Psych (Signed)
   [facility] PHP THERAPIST PROGRESS NOTE  Renee Turner [id number]  Session Time: 9 AM - 2 PM  Participation Level: Minimal  Behavioral Response: CasualAlert and LethargicDepressed  Type of Therapy: Group Therapy  Treatment Goals addressed: Coping  Interventions: CBT, DBT, Supportive and Reframing  Summary:  Clinician facilitated check-in regarding current stressors and situation, and review of patient completed diary card. Clinician utilized active listening and empathetic response and validated patient emotions. Clinician facilitated discussion on topics suggested by patients. Clinician introduced topic of cognitive distortions and how to increase awareness and counter those thoughts. Clinician assessed for immediate needs, medication compliance and efficacy, and safety concerns.     Suicidal/Homicidal: Yeswithout intent/plan  Therapist Response: Renee Turner is a 21 y.o. female who presents with depression symptoms. Patient arrived within time allowed and reports she is feeling "okay." Patient rates her mood at a 8 on a 1- 10 scale with 10 being great. Patient had increased participation and continued to have increased facial response. Patient reports understanding of topics and reports struggling most with over-generalization. Patient demonstrates some progress as evidenced by increases positivity about future goals, and scheduling self care events. Patient denies SI/HI.     Plan: Patient will continue in PHP and medication management. Work towards decreasing depression symptoms and increase emotional regulation and positive coping skills.    Diagnosis: Severe recurrent major depression without psychotic features ([place]) [F33.2]    1. Severe recurrent major depression without psychotic features [facility])       Lorin Glass, LCSW 12/07/2016

## 2016-12-10 ENCOUNTER — Other Ambulatory Visit (HOSPITAL_COMMUNITY): Payer: Self-pay

## 2016-12-11 ENCOUNTER — Ambulatory Visit (HOSPITAL_COMMUNITY): Payer: Self-pay | Admitting: Psychiatry

## 2016-12-11 ENCOUNTER — Other Ambulatory Visit (HOSPITAL_COMMUNITY): Payer: Self-pay

## 2016-12-12 ENCOUNTER — Other Ambulatory Visit (HOSPITAL_COMMUNITY): Payer: Self-pay

## 2016-12-13 ENCOUNTER — Other Ambulatory Visit (HOSPITAL_COMMUNITY): Payer: Self-pay

## 2016-12-13 ENCOUNTER — Ambulatory Visit (HOSPITAL_COMMUNITY): Payer: Self-pay

## 2016-12-14 ENCOUNTER — Other Ambulatory Visit (HOSPITAL_COMMUNITY): Payer: Self-pay

## 2016-12-20 ENCOUNTER — Ambulatory Visit (INDEPENDENT_AMBULATORY_CARE_PROVIDER_SITE_OTHER): Payer: Self-pay | Admitting: Psychiatry

## 2016-12-20 DIAGNOSIS — Z5329 Procedure and treatment not carried out because of patient's decision for other reasons: Secondary | ICD-10-CM

## 2016-12-21 NOTE — Progress Notes (Signed)
No show

## 2016-12-28 ENCOUNTER — Ambulatory Visit (HOSPITAL_COMMUNITY): Payer: Self-pay | Admitting: Psychiatry

## 2017-01-01 ENCOUNTER — Ambulatory Visit (INDEPENDENT_AMBULATORY_CARE_PROVIDER_SITE_OTHER): Payer: BLUE CROSS/BLUE SHIELD | Admitting: Psychiatry

## 2017-01-01 ENCOUNTER — Encounter (HOSPITAL_COMMUNITY): Payer: Self-pay | Admitting: Psychiatry

## 2017-01-01 VITALS — BP 118/76 | HR 101 | Ht 66.0 in | Wt 198.2 lb

## 2017-01-01 DIAGNOSIS — F332 Major depressive disorder, recurrent severe without psychotic features: Secondary | ICD-10-CM | POA: Diagnosis not present

## 2017-01-01 DIAGNOSIS — Z79899 Other long term (current) drug therapy: Secondary | ICD-10-CM

## 2017-01-01 MED ORDER — BUPROPION HCL ER (XL) 300 MG PO TB24: 300.0000 mg | ORAL_TABLET | Freq: Every day | ORAL | 1 refills | Status: DC

## 2017-01-01 MED ORDER — LAMOTRIGINE 100 MG PO TABS: 100.0000 mg | ORAL_TABLET | Freq: Every day | ORAL | 1 refills | Status: DC

## 2017-01-01 MED ORDER — VENLAFAXINE HCL ER 75 MG PO CP24: 150.0000 mg | ORAL_CAPSULE | Freq: Every day | ORAL | 2 refills | Status: DC

## 2017-01-01 NOTE — Patient Instructions (Signed)
STOP Adderall  STOP Zoloft  Start Effexor 1 capsule daily for 1 week, then increase to 2 capsules daily   Continue Lamictal  Continue Wellbutrin   Come back in 2 weeks
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
## 2017-01-15 ENCOUNTER — Ambulatory Visit (INDEPENDENT_AMBULATORY_CARE_PROVIDER_SITE_OTHER): Payer: BLUE CROSS/BLUE SHIELD | Admitting: Psychiatry

## 2017-01-15 DIAGNOSIS — Z79899 Other long term (current) drug therapy: Secondary | ICD-10-CM

## 2017-01-15 DIAGNOSIS — F332 Major depressive disorder, recurrent severe without psychotic features: Secondary | ICD-10-CM

## 2017-01-15 MED ORDER — BUPROPION HCL ER (XL) 300 MG PO TB24: 300.0000 mg | ORAL_TABLET | Freq: Every day | ORAL | 0 refills | Status: DC

## 2017-01-15 MED ORDER — LAMOTRIGINE 100 MG PO TABS: 100.0000 mg | ORAL_TABLET | Freq: Every day | ORAL | 0 refills | Status: DC

## 2017-01-15 MED ORDER — VENLAFAXINE HCL ER 150 MG PO CP24: 150.0000 mg | ORAL_CAPSULE | Freq: Every day | ORAL | 0 refills | Status: DC

## 2017-01-15 NOTE — Patient Instructions (Signed)
Continue Wellbutrin 300 mg XL daily   Increase Effexor to 150 mg XR daily (1 tablet)   Come back in 1 month

## 2017-01-15 NOTE — Progress Notes (Signed)
[place] MD/PA/NP OP Progress Note  01/15/2017 9:07 AM Renee Turner  MRN:  [id number]  Chief Complaint: depression Subjective:  She presents today for psychiatric med management follow-up. She reports that she has been taking Effexor 75 mg X are daily. She forgot to increase it to 2 tablets daily. She reports that she has not had any side effects or negative effects from it. No headaches, dry mouth, dizziness, nausea, vomiting. She reports that her mood is maybe slightly better. She reports that the tingling in her feet and hands is a little bit better as well. She reports that she has gone to youth church groups the past 2 weeks, and also when out with her sister to see black panther. She reports that she notices she is smiling more. 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 She reports that she is very appreciated, and loves her job, and she knows that she does a good job at it. She has not had any suicidal thoughts for the past couple weeks. I remark to the patient that she seems to be thinking more quickly than our last visit, and she is smiling more. She reports that it feels good to be able to do that.  Discussed medication changes, and associated risks and benefits. Patient agrees to follow-up in one month.  Visit Diagnosis:    ICD-9-CM ICD-10-CM   1. Severe episode of recurrent major depressive disorder, without psychotic features ([place]) 296.33 F33.2 venlafaxine XR (EFFEXOR-XR) 150 MG 24 hr capsule     buPROPion (WELLBUTRIN XL) 300 MG 24 hr tablet     lamoTRIgine (LAMICTAL) 100 MG tablet    Past Psychiatric History: See intake H&P for full details. Reviewed, with no updates at this time.   Past Medical History:  Past Medical History:  Diagnosis Date  . Anxiety   . Depression   . Diabetes mellitus without complication ([place])    Type II  . Diabetes mellitus, type II ([place])    No  past surgical history on file.  Family Psychiatric History: See intake H&P for full details. Reviewed, with no updates at this time.   Family History: No family history on file.  Social History:  Social History   Social History  . Marital status: Single    Spouse name: N/A  . Number of children: N/A  . Years of education: N/A   Social History Main Topics  . Smoking status: Never Smoker  . Smokeless tobacco: Never Used  . Alcohol use No  . Drug use: No  . Sexual activity: No   Other Topics Concern  . Not on file   Social History Narrative  . No narrative on file    Allergies: No Known Allergies  Metabolic Disorder Labs: No results found for: HGBA1C, MPG No results found for: PROLACTIN No results found for: CHOL, TRIG, HDL, CHOLHDL, VLDL, LDLCALC   Current Medications: Current Outpatient Prescriptions  Medication Sig Dispense Refill  . buPROPion (WELLBUTRIN XL) 300 MG 24 hr tablet Take 1 tablet (300 mg total) by mouth daily. 90 tablet 0  . dapagliflozin propanediol (FARXIGA) 10 MG TABS tablet Take 10 mg by mouth.    . Exenatide ER (BYDUREON) 2 MG PEN Inject 2 mg into the skin once a week.    . formoterol (FORADIL) 12 MCG capsule for inhaler Place 12 mcg into inhaler and inhale every  12 (twelve) hours.    . gabapentin (NEURONTIN) 300 MG capsule Take 1 capsule by mouth 3 (three) times daily.  2  . hydrOXYzine (ATARAX/VISTARIL) 50 MG tablet Take 1 tablet (50 mg total) by mouth 3 (three) times daily as needed for anxiety (sleep). 90 tablet 1  . lamoTRIgine (LAMICTAL) 100 MG tablet Take 1 tablet (100 mg total) by mouth at bedtime. 90 tablet 0  . metFORMIN (GLUCOPHAGE-XR) 500 MG 24 hr tablet Take 1,000 mg by mouth 2 (two) times daily.  3  . venlafaxine XR (EFFEXOR-XR) 150 MG 24 hr capsule Take 1 capsule (150 mg total) by mouth daily. 90 capsule 0   No current facility-administered medications for this visit.     Neurologic: Headache: Negative Seizure:  Negative Paresthesias: Negative  Musculoskeletal: Strength & Muscle Tone: within normal limits Gait & Station: normal Patient leans: N/A  Psychiatric Specialty Exam: Review of Systems  Constitutional: Negative.   HENT: Negative.   Respiratory: Negative.   Cardiovascular: Negative.   Genitourinary: Negative.   Musculoskeletal: Negative.   Neurological: Negative.   Psychiatric/Behavioral: Positive for depression. Negative for suicidal ideas.    There were no vitals taken for this visit.There is no height or weight on file to calculate BMI.  General Appearance: Casual and Fairly Groomed  Eye Contact:  Good  Speech:  Clear and Coherent  Volume:  Decreased  Mood:  Dysphoric  Affect:  Appropriate and Blunt  Thought Process:  Coherent  Orientation:  Full (Time, Place, and Person)  Thought Content: Logical   Suicidal Thoughts:  No  Homicidal Thoughts:  No  Memory:  Immediate;   Good  Judgement:  Good  Insight:  Fair  Psychomotor Activity:  Psychomotor Retardation and but improved from last visit  Concentration:  Concentration: Good  Recall:  NA  Fund of Knowledge: Good  Language: Good  Akathisia:  Negative  Handed:  Right  AIMS (if indicated):  na  Assets:  Communication Skills Desire for Improvement Financial Resources/Insurance Housing Leisure Time Physical Health Resilience Social Support Talents/Skills Transportation Vocational/Educational  ADL's:  Intact  Cognition: WNL  Sleep:  7 hours nightly    Treatment Plan Summary: Renee Turner is a 21 year old female with severe recurrent major depressive disorder, with psychomotor slowing, but no symptoms of catatonia. She presents today for medication management follow-up.  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  She continues to see her therapist regularly, and will see her therapist today at 9:30.  We are titrating her dose of Effexor, and she has had benefit  with the switch from Zoloft. No significant negative effects from discontinuing Adderall. Will proceed as below and follow up in 1 month. She does not present any acute safety issues at this time.  1. Severe episode of recurrent major depressive disorder, without psychotic features ([place])   - Increase Effexor to 150 mg Xr, reviewed the risks and benefits - Continue Wellbutrin 300 mg XL daily - Continue Lamictal 100 mg nightly, and Vistaril 50-100 mg nightly for sleep - [facility] - Follow-up in 1 month  Aundra Dubin, MD 01/15/2017, 9:07 AM

## 2017-01-23 ENCOUNTER — Other Ambulatory Visit: Payer: Self-pay | Admitting: Psychiatry

## 2017-02-15 ENCOUNTER — Ambulatory Visit (INDEPENDENT_AMBULATORY_CARE_PROVIDER_SITE_OTHER): Payer: BLUE CROSS/BLUE SHIELD | Admitting: Psychiatry

## 2017-02-15 DIAGNOSIS — F332 Major depressive disorder, recurrent severe without psychotic features: Secondary | ICD-10-CM | POA: Diagnosis not present

## 2017-02-15 DIAGNOSIS — Z79899 Other long term (current) drug therapy: Secondary | ICD-10-CM | POA: Diagnosis not present

## 2017-02-15 MED ORDER — BUPROPION HCL ER (XL) 300 MG PO TB24: 300.0000 mg | ORAL_TABLET | Freq: Every day | ORAL | 0 refills | Status: DC

## 2017-02-15 MED ORDER — LAMOTRIGINE 100 MG PO TABS: 100.0000 mg | ORAL_TABLET | Freq: Every day | ORAL | 0 refills | Status: DC

## 2017-02-15 MED ORDER — VENLAFAXINE HCL ER 150 MG PO CP24: 150.0000 mg | ORAL_CAPSULE | Freq: Every day | ORAL | 0 refills | Status: DC

## 2017-02-15 MED ORDER — HYDROXYZINE HCL 50 MG PO TABS: 50.0000 mg | ORAL_TABLET | Freq: Three times a day (TID) | ORAL | 1 refills | Status: DC | PRN

## 2017-02-15 NOTE — Progress Notes (Signed)
[facility] MD/PA/NP OP Progress Note  02/15/2017 11:12 AM Renee Turner  MRN:  [id number]  Chief Complaint:  doing okay  Subjective:  Renee Turner presents today for psychiatric med management follow-up. She reports that her mood has been doing okay. She had seen her therapist a few weeks ago and was a little bit down and depressed, but she was able to bounce back. No significant effect on her work or personal life. She continues to hang out with her sisters. She denies any suicidal thoughts. She continues to be anxious about returning back to school, but says "just have to rip off the bandaid."  She was looking at puppies earlier this morning, and may be considering adopting a puppy. I spent time with the patient weighing the positives of this, and the responsibility, and encouraged her to strongly consider moving forward with this, as this would be a positive step for her to get out of the house more, and to be active. He agrees, and feels excited to continue looking at possibly adopting a puppy. 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  She reports that she goes to the gym a few days a week to go swimming, and enjoys sitting in the hot tub at the gym.  Her medications, she continues on Wellbutrin and Effexor in the morning. She reports that she forgets to take Lamictal couple days every week, and we talked about strategies to make herself more adherent to this. She agrees to set an alarm for 10:30 every night so she can take her Lamictal.  We discussed potentially increasing the Effexor, but she will first work on being more adherent to Lamictal, and we will consider this at our next visit.  I spent time educating the patient about [place], the associated risks and benefits, and reviewing this therapy option with the patient. She was receptive to this, and will read over the materials provided.  Visit Diagnosis:    ICD-9-CM ICD-10-CM   1. Severe episode of recurrent major depressive disorder,  without psychotic features ([place]) 296.33 F33.2 venlafaxine XR (EFFEXOR-XR) 150 MG 24 hr capsule     lamoTRIgine (LAMICTAL) 100 MG tablet     buPROPion (WELLBUTRIN XL) 300 MG 24 hr tablet   Past Psychiatric History: See intake H&P for full details. Reviewed, with no updates at this time.  Past Medical History:  Past Medical History:  Diagnosis Date  . Anxiety   . Depression   . Diabetes mellitus without complication ([place])    Type II  . Diabetes mellitus, type II ([place])    No past surgical history on file.  Family Psychiatric History: See intake H&P for full details. Reviewed, with no updates at this time.   Family History: No family history on file.  Social History:  Social History   Social History  . Marital status: Single    Spouse name: N/A  . Number of children: N/A  . Years of education: N/A   Social History Main Topics  . Smoking status: Never Smoker  . Smokeless tobacco: Never Used  . Alcohol use No  . Drug use: No  . Sexual activity: No   Other Topics Concern  . Not on file   Social History Narrative  . No narrative on file    Allergies: No Known Allergies  Metabolic Disorder Labs: No results found for: HGBA1C, MPG No results found for: PROLACTIN No results found for: CHOL, TRIG, HDL, CHOLHDL, VLDL, LDLCALC   Current Medications:  Current Outpatient Prescriptions  Medication Sig Dispense Refill  . buPROPion (WELLBUTRIN XL) 300 MG 24 hr tablet Take 1 tablet (300 mg total) by mouth daily. 90 tablet 0  . dapagliflozin propanediol (FARXIGA) 10 MG TABS tablet Take 10 mg by mouth.    . Exenatide ER (BYDUREON) 2 MG PEN Inject 2 mg into the skin once a week.    . formoterol (FORADIL) 12 MCG capsule for inhaler Place 12 mcg into inhaler and inhale every 12 (twelve) hours.    . gabapentin (NEURONTIN) 300 MG capsule Take 1 capsule by mouth 3 (three) times daily.  2  . hydrOXYzine (ATARAX/VISTARIL) 50 MG tablet Take 1 tablet (50 mg total) by mouth 3 (three) times  daily as needed for anxiety (sleep). 90 tablet 1  . hydrOXYzine (ATARAX/VISTARIL) 50 MG tablet Take 1 tablet (50 mg total) by mouth 3 (three) times daily as needed for anxiety (sleep). 90 tablet 1  . lamoTRIgine (LAMICTAL) 100 MG tablet Take 1 tablet (100 mg total) by mouth at bedtime. 90 tablet 0  . metFORMIN (GLUCOPHAGE-XR) 500 MG 24 hr tablet Take 1,000 mg by mouth 2 (two) times daily.  3  . venlafaxine XR (EFFEXOR-XR) 150 MG 24 hr capsule Take 1 capsule (150 mg total) by mouth daily. 90 capsule 0   No current facility-administered medications for this visit.     Neurologic: Headache: Negative Seizure: Negative Paresthesias: Negative  Musculoskeletal: Strength & Muscle Tone: within normal limits Gait & Station: normal Patient leans: N/A  Psychiatric Specialty Exam: Review of Systems  Constitutional: Negative.   HENT: Negative.   Respiratory: Negative.   Cardiovascular: Negative.   Genitourinary: Negative.   Musculoskeletal: Negative.   Neurological: Negative.   Psychiatric/Behavioral: Positive for depression. Negative for suicidal ideas.    Blood pressure 118/70, pulse 86, height 5' 6.5" (1.689 m), weight 187 lb 6.4 oz (85 kg).Body mass index is 29.79 kg/m.  General Appearance: She is well groomed, more smiling and affect than prior visits, but she remains fairly shy and introverted  Eye Contact:  Good  Speech:  Clear and Coherent  Volume:  Baseline speech is low volume  Mood:  Euthymic  Affect:  Congruent and Some improvement and smiling and affective expression  Thought Process:  Coherent  Orientation:  Full (Time, Place, and Person)  Thought Content: Logical   Suicidal Thoughts:  No  Homicidal Thoughts:  No  Memory:  Immediate;   Good  Judgement:  Good  Insight:  Fair  Psychomotor Activity:  Normal  Concentration:  Concentration: Good  Recall:  Good  Fund of Knowledge: Good  Language: Good  Akathisia:  Negative  Handed:  Right  AIMS (if indicated):  na   Assets:  Communication Skills Desire for Improvement Financial Resources/Insurance Housing Leisure Time [place] Talents/Skills Transportation Vocational/Educational Others:  Forward thinking goals, return to school, adoptive puppy  ADL's:  Intact  Cognition: WNL  Sleep:  7 hours nightly    Treatment Plan Summary: Albirtha Grinage is a 21 year old female with severe recurrent major depressive disorder Without psychotic features.  She currently maintains the regimen of Effexor 150 XR, Wellbutrin 300 mg XL, Lamictal 100 mg nightly, and has previously been tried on a therapeutic dose of Zoloft. She continues to struggle with periods of breakthrough depression, but this is in the setting of inconsistent adherence to Lamictal. She will work on being more adherent to her nightly dose of Lamictal and follow-up in 6 weeks. If the patient continues to  have some breakthrough depressive symptoms, I'd like to return to the discussion of potential ECT or transcranial magnetic stimulation. She denies any suicidality at this time, and is actually making some progress in forward thinking goals such as returning to school, and considering adopting a puppy.  1. Severe episode of recurrent major depressive disorder, without psychotic features ([place])    - Continue Effexor 150 mg XR, reviewed the risks and benefits - Continue Wellbutrin 300 mg XL daily - Continue Lamictal 100 mg nightly, and Vistaril 50-100 mg nightly for sleep - [facility] - Follow-up in 6 weeks  - I have provided the patient with an information packet on [place] and its use for depression, and we have begun the discussion of the risks and benefits of this treatment  Aundra Dubin, MD 02/15/2017, 11:12 AM

## 2017-04-02 ENCOUNTER — Encounter (HOSPITAL_COMMUNITY): Payer: Self-pay | Admitting: Psychiatry

## 2017-04-02 ENCOUNTER — Ambulatory Visit (INDEPENDENT_AMBULATORY_CARE_PROVIDER_SITE_OTHER): Payer: BLUE CROSS/BLUE SHIELD | Admitting: Psychiatry

## 2017-04-02 VITALS — BP 118/72 | HR 79 | Ht 66.0 in | Wt 191.6 lb

## 2017-04-02 DIAGNOSIS — F332 Major depressive disorder, recurrent severe without psychotic features: Secondary | ICD-10-CM

## 2017-04-02 NOTE — Progress Notes (Signed)
[facility] MD/PA/NP OP Progress Note  04/02/2017 10:56 AM Renee Turner  MRN:  [id number]  Chief Complaint:  med follow-up  Subjective:  Renee Turner presents today for psychiatric med management follow-up and consideration of [place].  She reports that she continues to have a troubled time with remembering her medications, both Effexor and Lamictal. She continues to struggle with depression symptoms, namely low energy, sleeping, poor self confidence, psychomotor slowing, periodic thoughts about death and dying, but denies any active SI. She continues to be very tired and fatigued and often sleeps beyond what she wants to, due to a lack of motivation to get out of bed. She has been late to work several times.  I suggested that we move forward with [place], and she was agreeable. We reviewed the risks and benefits, risk of seizure, common side effects and experienced during [place].  She does not have any medical contraindications or medical implanted devices.  Visit Diagnosis:    ICD-9-CM ICD-10-CM   1. Severe episode of recurrent major depressive disorder, without psychotic features ([place]) 296.33 F33.2    Past Psychiatric History: See intake H&P for full details. Reviewed, with no updates at this time.  Past Medical History:  Past Medical History:  Diagnosis Date  . Anxiety   . Depression   . Diabetes mellitus without complication ([place])    Type II  . Diabetes mellitus, type II ([place])    History reviewed. No pertinent surgical history.  Family Psychiatric History: See intake H&P for full details. Reviewed, with no updates at this time.   Family History: History reviewed. No pertinent family history.  Social History:  Social History   Social History  . Marital status: Single    Spouse name: N/A  . Number of children: N/A  . Years of education: N/A   Social History Main Topics  . Smoking status: Never Smoker  . Smokeless tobacco: Never Used  . Alcohol use No  . Drug use: No  . Sexual activity: No    Other Topics Concern  . None   Social History Narrative  . None    Allergies: No Known Allergies  Metabolic Disorder Labs: No results found for: HGBA1C, MPG No results found for: PROLACTIN No results found for: CHOL, TRIG, HDL, CHOLHDL, VLDL, LDLCALC   Current Medications: Current Outpatient Prescriptions  Medication Sig Dispense Refill  . buPROPion (WELLBUTRIN XL) 300 MG 24 hr tablet Take 1 tablet (300 mg total) by mouth daily. 90 tablet 0  . dapagliflozin propanediol (FARXIGA) 10 MG TABS tablet Take 10 mg by mouth.    . Exenatide ER (BYDUREON) 2 MG PEN Inject 2 mg into the skin once a week.    . Ferrous Sulfate (IRON) 325 (65 Fe) MG TABS Take 325 mg by mouth daily.  3  . gabapentin (NEURONTIN) 300 MG capsule Take 1 capsule by mouth 3 (three) times daily.  2  . hydrOXYzine (ATARAX/VISTARIL) 50 MG tablet Take 1 tablet (50 mg total) by mouth 3 (three) times daily as needed for anxiety (sleep). 90 tablet 1  . lamoTRIgine (LAMICTAL) 100 MG tablet Take 1 tablet (100 mg total) by mouth at bedtime. 90 tablet 0  . metFORMIN (GLUCOPHAGE-XR) 500 MG 24 hr tablet Take 1,000 mg by mouth 2 (two) times daily.  3  . venlafaxine XR (EFFEXOR-XR) 150 MG 24 hr capsule Take 1 capsule (150 mg total) by mouth daily. 90 capsule 0   No current facility-administered medications for this visit.     Neurologic: Headache:  Negative Seizure: Negative Paresthesias: Negative  Musculoskeletal: Strength & Muscle Tone: within normal limits Gait & Station: normal Patient leans: N/A  Psychiatric Specialty Exam: Review of Systems  Constitutional: Negative.   HENT: Negative.   Respiratory: Negative.   Cardiovascular: Negative.   Genitourinary: Negative.   Musculoskeletal: Negative.   Neurological: Negative.   Psychiatric/Behavioral: Positive for depression, memory loss and suicidal ideas.    Blood pressure 118/72, pulse 79, height 5' 6" (1.676 m), weight 191 lb 9.6 oz (86.9 kg).Body mass index is  30.93 kg/m.  General Appearance: She is well groomed, more smiling and affect than prior visits, but she remains fairly shy and introverted  Eye Contact:  Good  Speech:  Clear and Coherent  Volume:  Baseline speech is low volume  Mood:  Euthymic  Affect:  Congruent and Some improvement and smiling and affective expression  Thought Process:  Coherent  Orientation:  Full (Time, Place, and Person)  Thought Content: Logical   Suicidal Thoughts:  No  Homicidal Thoughts:  No  Memory:  Immediate;   Good  Judgement:  Good  Insight:  Fair  Psychomotor Activity:  Normal  Concentration:  Concentration: Good  Recall:  Good  Fund of Knowledge: Good  Language: Good  Akathisia:  Negative  Handed:  Right  AIMS (if indicated):  na  Assets:  Communication Skills Desire for Improvement Financial Resources/Insurance Housing Leisure Time Physical Health Resilience Social Support Talents/Skills Transportation Vocational/Educational Others:  Forward thinking goals, return to school, adoptive puppy  ADL's:  Intact  Cognition: WNL  Sleep:  7 hours nightly    Treatment Plan Summary: Renee Turner is a 21 year-old female with severe recurrent major depressive disorder Without psychotic features. She continues with severe symptoms, and we reviewed the risks and benefits of TMS. She has no medical contraindications to TMS therapy, and understands the risks involved.  Depression screen PHQ 2/9 04/02/2017  Decreased Interest 2  Down, Depressed, Hopeless 3  PHQ - 2 Score 5  Altered sleeping 3  Tired, decreased energy 3  Change in appetite 3  Feeling bad or failure about yourself  3  Trouble concentrating 2  Moving slowly or fidgety/restless 3  Suicidal thoughts 1  PHQ-9 Score 23   Patient has been tried on Wellbutrin, Lamictal, Zoloft, Effexor, Adderall for augmenting antidepressant.  She has had adequate trials of these medications, with continued depression.  1. Severe episode of recurrent  major depressive disorder, without psychotic features ([name])     - Continue Effexor 150 mg XR, reviewed the risks and benefits - Continue Wellbutrin 300 mg XL daily - Continue Lamictal 100 mg nightly, and Vistaril 50-100 mg nightly for sleep - [facility] - TMS insurance investigation, and scheduling for initial MT - Follow-up in 10-12 weeks   Arya , MD 04/02/2017, 10:56 AM 

## 2017-04-12 ENCOUNTER — Other Ambulatory Visit (HOSPITAL_COMMUNITY): Payer: BLUE CROSS/BLUE SHIELD | Attending: Psychiatry

## 2017-04-12 ENCOUNTER — Encounter (HOSPITAL_COMMUNITY): Payer: Self-pay

## 2017-04-12 DIAGNOSIS — F332 Major depressive disorder, recurrent severe without psychotic features: Secondary | ICD-10-CM

## 2017-04-12 NOTE — Progress Notes (Signed)
Patient ID: Renee Turner, female   DOB: November 12, 1995, 21 y.o.   MRN: [id number] Pt reported to [facility] for cortical mapping and motor threshold determination for Repetitive Transcra nial Magnetic Stimulation treatment for Major Depressive Disorder. Pt completed a PHQ-9 with a score of 24 ( severe depression). Pt also completed a Beck's Depression Inventory with a score of 46 (severe depression). Prior to procedure, pt signed an informed consent agreement for [place] treatment. Pt's treatment area was found by applying single pulses to her left motor cortex, hunting along the anterior/posterior plane and along the superior oblique angle until the best motor response was elicited from the pt's right thumb. The best response was observed at 8.5  A/P and 25 degrees SOA, with a coil angle of 5 degrees. Pt's motor threshold was calculated using the [employer]'s proprietary MT Assist algorithm, which produced a calculated motor threshold of 1.60 SMT. Per these findings, pt's treatment parameters are as follows: A/P -- 14 cm, SOA --25 degrees, Coil Angle -- 5 degrees, Motor Threshold -- 1.60 SMT. With these parameters, the pt will receive 36 sessions of TMS according to the following protocol: 3000 pulses per session, with stimulation in bursts of pulses lasting 4 seconds at a frequency of 10 Hz, separated by 26 seconds of rest. After determining pt's tx parameters, coil was moved to the treatment location, and the first burst of pulses was applied at a reduced power of 80%MT. Pt reported no complaints, and stated that the stimulation was tolerable, so the first tx session was given to the pt. Stimulation power remained at 80%MT. Power will gradually increase to 120%MT over the next few treatment sessions. Pt tolerated tx well. Pt and wife left with no complaints post-tx. Pt departed from clinic without issue.

## 2017-04-15 ENCOUNTER — Other Ambulatory Visit (INDEPENDENT_AMBULATORY_CARE_PROVIDER_SITE_OTHER): Payer: BLUE CROSS/BLUE SHIELD | Admitting: Emergency Medicine

## 2017-04-15 DIAGNOSIS — F332 Major depressive disorder, recurrent severe without psychotic features: Secondary | ICD-10-CM

## 2017-04-15 NOTE — Progress Notes (Signed)
Patient reported to [facility] for Repetitive Transcranial Magnetic Stimulation treatment for severe episode of recurrent major depressive disorder, without psychotic features. Patient presented with appropriate affect, level mood and denied any suicidal or homicidal ideations. Patient stated that she did not experience any side effects (i.e, headaches) during the weekend.. Patient reported no change in medications,alcohol/substane use, caffeine consumption, sleep pattern or metal implant status since previous treatment. Patient watched tvduring treatment.Pt expressed discomfort during treatment - pt was told that her power level will not be increased today. Pt was watched closely due to her high power level. Power remained at 80%for the duration of treatment. . Patient departed post-treatment with no reported concerns.  

## 2017-04-16 ENCOUNTER — Other Ambulatory Visit (INDEPENDENT_AMBULATORY_CARE_PROVIDER_SITE_OTHER): Payer: BLUE CROSS/BLUE SHIELD | Admitting: Emergency Medicine

## 2017-04-16 DIAGNOSIS — F332 Major depressive disorder, recurrent severe without psychotic features: Secondary | ICD-10-CM | POA: Diagnosis not present

## 2017-04-16 NOTE — Progress Notes (Signed)
Patient reported to [facility] for Repetitive Transcranial Magnetic Stimulation treatment for severe episode of recurrent major depressive disorder, without psychotic features. Patient presented with appropriate affect, level mood and denied any suicidal or homicidal ideations. Patient stated that she did not experience any side effects (i.e, headaches) during the weekend.. Patient reported no change in medications,alcohol/substane use, caffeine consumption, sleep pattern or metal implant status since previous treatment. Patient watched tvduring treatment.Pt expressed discomfort during treatment - pt was told that her power level will not be increased today. Pt was watched closely due to her high power level. Power remained at 80%for the duration of treatment. . Patient departed post-treatment with no reported concerns.  

## 2017-04-16 NOTE — Progress Notes (Deleted)
[facility] for Repetitive Transcranial Magnetic Stimulation treatment for severe episode of recurrent major depressive disorder, without psychotic features. Patient presented with appropriate affect, level mood and denied any suicidal or homicidal ideations. Patient stated that she did not experience any side effects (i.e, headaches) during the weekend. Patient reported no change in medications, alcohol/substane use, caffeine consumption, sleep pattern or metal implant status since previous treatment. Patient watched tv during treatment.Pt expressed discomfort during treatment - pt was told that her power level will slowly be ncreased today. Pt was watched closely due to her high power level. Power was increased to 100% for the duration of treatment.Patient departed post-treatment with no reported concerns.

## 2017-04-17 ENCOUNTER — Other Ambulatory Visit (INDEPENDENT_AMBULATORY_CARE_PROVIDER_SITE_OTHER): Payer: BLUE CROSS/BLUE SHIELD | Admitting: Emergency Medicine

## 2017-04-17 DIAGNOSIS — F332 Major depressive disorder, recurrent severe without psychotic features: Secondary | ICD-10-CM

## 2017-04-17 NOTE — Progress Notes (Signed)
[facility] Repetitive Transcranial Magnetic Stimulation treatment for severe episode of recurrent major depressive disorder, without psychotic features. Patient presented with appropriate affect, level mood and denied any suicidal or homicidal ideations. Patient stated that she did not experience any side effects (i.e, headaches) during the weekend.. Patient reported no change in medications,alcohol/substane use, caffeine consumption, sleep pattern or metal implant status since previous treatment. Patient spoke with Clinical research associatewriter the entire session. Pt shared her hobbies and her idea of starting a journal. Pt expressed discomfort during treatment - pt was told that her power level will not be increased today. Pt was watched closely due to her high power level. Power remained at 80%for the duration of treatment. . Patient departed post-treatment with no reported concerns.

## 2017-04-18 ENCOUNTER — Other Ambulatory Visit (INDEPENDENT_AMBULATORY_CARE_PROVIDER_SITE_OTHER): Payer: BLUE CROSS/BLUE SHIELD | Admitting: Emergency Medicine

## 2017-04-18 DIAGNOSIS — F332 Major depressive disorder, recurrent severe without psychotic features: Secondary | ICD-10-CM | POA: Diagnosis not present

## 2017-04-18 NOTE — Progress Notes (Signed)
[facility] for Repetitive Transcranial Magnetic Stimulation treatment for severe episode of recurrent major depressive disorder, without psychotic features. Patient presented with appropriate affect, level mood and denied any suicidal or homicidal ideations. Patient stated that she did not experience any side effects (i.e, headaches) during the weekend.. Patient reported no change in medications,alcohol/substane use, caffeine consumption, sleep pattern or metal implant status since previous treatment. Patient watched tvduring treatment.Pt expressed discomfort during treatment - pt was told that her power level will not be increased today. Pt was watched closely due to her high power level. Power remained at 80%for the duration of treatment. . Patient departed post-treatment with no reported concerns.

## 2017-04-19 ENCOUNTER — Other Ambulatory Visit (INDEPENDENT_AMBULATORY_CARE_PROVIDER_SITE_OTHER): Payer: BLUE CROSS/BLUE SHIELD | Admitting: Emergency Medicine

## 2017-04-19 DIAGNOSIS — F332 Major depressive disorder, recurrent severe without psychotic features: Secondary | ICD-10-CM

## 2017-04-19 NOTE — Progress Notes (Signed)
[facility] for Repetitive Transcranial Magnetic Stimulation treatment for severe episode of recurrent major depressive disorder, without psychotic features. Patient presented with appropriate affect, level mood and denied any suicidal or homicidal ideations. Patient stated that she did not experience any side effects (i.e, headaches) during the weekend.. Patient reported no change in medications,alcohol/substane use, caffeine consumption, sleep pattern or metal implant status since previous treatment. Patient spoke with Clinical research associatewriter the entire session.Pt expressed that she has gotten accustomed to her power level. Pt was watched closely due to her high power level. Power remained at 100%for the duration of treatment. Pt will not exceed this power level due to her high power level of 1.6. Patient departed post-treatment with no reported concerns.

## 2017-04-22 ENCOUNTER — Other Ambulatory Visit (INDEPENDENT_AMBULATORY_CARE_PROVIDER_SITE_OTHER): Payer: BLUE CROSS/BLUE SHIELD

## 2017-04-22 DIAGNOSIS — F332 Major depressive disorder, recurrent severe without psychotic features: Secondary | ICD-10-CM

## 2017-04-22 NOTE — Progress Notes (Signed)
Patient ID: Renee Turner, female   DOB: January 16, 1996, 21 y.o.   MRN: [id number] Patient reported to [facility] for Repetitive Transcranial Magnetic Stimulation treatment for severe episode of recurrent major depressive disorder, without psychotic features. Patient presented with appropriate affect, level mood and denied any suicidal or homicidal ideations. Patient stated that she did not experience any side effects (i.e, headaches) during the weekend.. Patient reported no change in medications,alcohol/substane use, caffeine consumption, sleep pattern or metal implant status since previous treatment. Patient watched television during tx.Pt confirmed that she has not been experiencing any adverse side effects to [place] such as headaches or dizziness. Pt was monitored closely during tx due to her high power level. Power remained at 100%for the duration of treatment as pt will not exceed this power level due to her high SMT of 1.60. Patient departed post-treatment with no reported concerns.

## 2017-04-23 ENCOUNTER — Other Ambulatory Visit (INDEPENDENT_AMBULATORY_CARE_PROVIDER_SITE_OTHER): Payer: BLUE CROSS/BLUE SHIELD | Admitting: Emergency Medicine

## 2017-04-23 DIAGNOSIS — F332 Major depressive disorder, recurrent severe without psychotic features: Secondary | ICD-10-CM | POA: Diagnosis not present

## 2017-04-23 NOTE — Progress Notes (Signed)
[facility] for Repetitive Transcranial Magnetic Stimulation treatment for severe episode of recurrent major depressive disorder, without psychotic features. Patient presented with appropriate affect, level mood and denied any suicidal or homicidal ideations. Patient stated that she did not experience any side effects (i.e, headaches) during the weekend.. Patient reported no change in medications,alcohol/substane use, caffeine consumption, sleep pattern or metal implant status since previous treatment. Patient watched television during tx. Pt was monitored closely during tx due to her high power level. Power remained at 100%for the duration of treatment as pt will not exceed this power level due to her high SMT of 1.60.Patient departed post-treatment with no reported concerns.

## 2017-04-24 ENCOUNTER — Other Ambulatory Visit (INDEPENDENT_AMBULATORY_CARE_PROVIDER_SITE_OTHER): Payer: BLUE CROSS/BLUE SHIELD | Admitting: Emergency Medicine

## 2017-04-24 DIAGNOSIS — F332 Major depressive disorder, recurrent severe without psychotic features: Secondary | ICD-10-CM

## 2017-04-24 NOTE — Progress Notes (Signed)
[facility] for Repetitive Transcranial Magnetic Stimulation treatment for severe episode of recurrent major depressive disorder, without psychotic features. Patient presented with appropriate affect, level mood and denied any suicidal or homicidal ideations. Patient reported no change in medications,alcohol/substane use, caffeine consumption, sleep pattern or metal implant status since previous treatment. Patient spoke watched tv during the entire session.Pt expressed that she has gotten accustomed to her power level. Pt was watched closely due to her high power level. Power remained at 100%for the duration of treatment. Pt will not exceed this power level due to her high power level of 1.6. Patient departed post-treatment with no reported concerns.

## 2017-04-25 ENCOUNTER — Other Ambulatory Visit (INDEPENDENT_AMBULATORY_CARE_PROVIDER_SITE_OTHER): Payer: BLUE CROSS/BLUE SHIELD | Admitting: Emergency Medicine

## 2017-04-25 DIAGNOSIS — F332 Major depressive disorder, recurrent severe without psychotic features: Secondary | ICD-10-CM

## 2017-04-25 NOTE — Progress Notes (Signed)
[facility] for Repetitive Transcranial Magnetic Stimulation treatment for severe episode of recurrent major depressive disorder, without psychotic features. Patient presented with appropriate affect, level mood and denied any suicidal or homicidal ideations. Patient stated that she did not experience any side effects (i.e, headaches) during the weekend.. Patient reported no change in medications,alcohol/substane use, caffeine consumption, sleep pattern or metal implant status since previous treatment. Writer suggested bringing her journal to session. Pt brought journal and her published work that she wanted to share. Pt shared that she hasnt read it since writing it 2 years ago. Writer gave her an assignment - to read her excerpt and to make her first journal entry (reflection to her writing). Pt happily took on the suggestion. Pt expressed her appreciation for the encouragement to start back writing again. Pt was monitored closely during tx due to her high power level. Power remained at 100%for the duration of treatment as pt willnot exceed this power level due to her high SMTof 1.60.Patient departed post-treatment with no reported concerns.

## 2017-04-26 ENCOUNTER — Other Ambulatory Visit (INDEPENDENT_AMBULATORY_CARE_PROVIDER_SITE_OTHER): Payer: BLUE CROSS/BLUE SHIELD | Admitting: Emergency Medicine

## 2017-04-26 DIAGNOSIS — F332 Major depressive disorder, recurrent severe without psychotic features: Secondary | ICD-10-CM | POA: Diagnosis not present

## 2017-04-26 NOTE — Progress Notes (Signed)
[facility] for Repetitive Transcranial Magnetic Stimulation treatment for severe episode of recurrent major depressive disorder, without psychotic features. Patient presented with appropriate affect, level mood and denied any suicidal or homicidal ideations. Patient stated that she did not experience any side effects (i.e, headaches) during the weekend.. Patient reported no change in medications,alcohol/substane use, caffeine consumption, sleep pattern or metal implant status since previous treatment. Patient watched television and wrote in her journal during tx. Pt was monitored closely during tx due to her high power level. Power remained at 100%for the duration of treatment as pt willnot exceed this power level due to her high SMTof 1.60.Patient departed post-treatment with no reported concerns.

## 2017-04-29 ENCOUNTER — Other Ambulatory Visit (INDEPENDENT_AMBULATORY_CARE_PROVIDER_SITE_OTHER): Payer: BLUE CROSS/BLUE SHIELD

## 2017-04-29 DIAGNOSIS — F332 Major depressive disorder, recurrent severe without psychotic features: Secondary | ICD-10-CM

## 2017-04-29 NOTE — Progress Notes (Signed)
Patient ID: Renee Turner, female   DOB: [birth year], 22 y.o.   MRN: [id number] Patient reported to [facility] for Repetitive Transcranial Magnetic Stimulation treatment for severe episode of recurrent major depressive disorder, without psychotic features. Patient presented with appropriate affect, level mood and denied any suicidal or homicidal ideations. Patient stated that she did not experience any side effects (i.e, headaches) during the weekend.. Patient reported no change in medications,alcohol/substane use, caffeine consumption, sleep pattern or metal implant status since previous treatment. Patient watched television and wrote in her journal during tx. Pt was monitored closely during tx due to her high power level. Power remained at 100%for the duration of treatment as pt willnot exceed this power level due to her high SMTof 1.60.Patient departed post-treatment with no reported concerns.

## 2017-04-30 ENCOUNTER — Other Ambulatory Visit (INDEPENDENT_AMBULATORY_CARE_PROVIDER_SITE_OTHER): Payer: BLUE CROSS/BLUE SHIELD | Admitting: Emergency Medicine

## 2017-04-30 DIAGNOSIS — F332 Major depressive disorder, recurrent severe without psychotic features: Secondary | ICD-10-CM | POA: Diagnosis not present

## 2017-04-30 NOTE — Progress Notes (Signed)
Patient reported to [facility] for Repetitive Transcranial Magnetic Stimulation treatment for severe episode of recurrent major depressive disorder, without psychotic features. Patient presented with appropriate affect, level mood and denied any suicidal or homicidal ideations. Patient stated that she did not experience any side effects (i.e, headaches) during the weekend.. Patient reported no change in medications,alcohol/substane use, caffeine consumption, sleep pattern or metal implant status since previous treatment. Patient wrote in her journalduring tx. Pt shared her weekend outing. Pt is spending more time with family and friends. Pt was monitored closely during tx due to her high power level. Power leveled at 100%for the duration of treatment as pt willnot exceed this power level due to her high SMTof 1.60.Patient departed post-treatment with no reported concerns. 

## 2017-05-01 ENCOUNTER — Other Ambulatory Visit (INDEPENDENT_AMBULATORY_CARE_PROVIDER_SITE_OTHER): Payer: BLUE CROSS/BLUE SHIELD | Admitting: Emergency Medicine

## 2017-05-01 DIAGNOSIS — F332 Major depressive disorder, recurrent severe without psychotic features: Secondary | ICD-10-CM | POA: Diagnosis not present

## 2017-05-01 NOTE — Progress Notes (Signed)
[facility] for Repetitive Transcranial Magnetic Stimulation treatment for severe episode of recurrent major depressive disorder, without psychotic features. Patient presented with appropriate affect, level mood and denied any suicidal or homicidal ideations. Patient stated that she did not experience any side effects (i.e, headaches) during the weekend.. Patient reported no change in medications,alcohol/substane use, caffeine consumption, sleep pattern or metal implant status since previous treatment. Patient wrote in her journalduring tx. Pt shared her weekend outing. Pt is spending more time with family and friends. Pt was monitored closely during tx due to her high power level. Power leveled at 100%for the duration of treatment as pt willnot exceed this power level due to her high SMTof 1.60.Patient departed post-treatment with no reported concerns.

## 2017-05-02 ENCOUNTER — Other Ambulatory Visit (INDEPENDENT_AMBULATORY_CARE_PROVIDER_SITE_OTHER): Payer: BLUE CROSS/BLUE SHIELD | Admitting: Emergency Medicine

## 2017-05-02 DIAGNOSIS — F332 Major depressive disorder, recurrent severe without psychotic features: Secondary | ICD-10-CM | POA: Diagnosis not present

## 2017-05-02 NOTE — Progress Notes (Signed)
Patient reported to [facility] for Repetitive Transcranial Magnetic Stimulation treatment for severe episode of recurrent major depressive disorder, without psychotic features. Patient presented with appropriate affect, level mood and denied any suicidal or homicidal ideations. Patient stated that she did not experience any side effects (i.e, headaches) during the weekend.. Patient reported no change in medications,alcohol/substane use, caffeine consumption, sleep pattern or metal implant status since previous treatment. Patient wrote in her journalduring tx. Pt shared her weekend outing. Pt is spending more time with family and friends. Pt was monitored closely during tx due to her high power level. Power leveled at 100%for the duration of treatment as pt willnot exceed this power level due to her high SMTof 1.60.Patient departed post-treatment with no reported concerns. 

## 2017-05-03 ENCOUNTER — Encounter (HOSPITAL_COMMUNITY): Payer: Self-pay | Admitting: Emergency Medicine

## 2017-05-06 ENCOUNTER — Other Ambulatory Visit (HOSPITAL_COMMUNITY): Payer: BLUE CROSS/BLUE SHIELD | Attending: Psychiatry

## 2017-05-06 DIAGNOSIS — F332 Major depressive disorder, recurrent severe without psychotic features: Secondary | ICD-10-CM | POA: Diagnosis not present

## 2017-05-06 NOTE — Progress Notes (Signed)
[facility] for Repetitive Transcranial Magnetic Stimulation treatment for severe episode of recurrent major depressive disorder, without psychotic features. Patient presented with appropriate affect, level mood and denied any suicidal or homicidal ideations. Patient stated that she did not experience any side effects (i.e, headaches) during the weekend.. Patient reported no change in medications,alcohol/substane use, caffeine consumption, sleep pattern or metal implant status since previous treatment. Patient watched tv. Pt tried something different- dyed her hair. She is happy about the positive change. Pt was monitored closely during tx due to her high power level. Power leveled at 100%for the duration of treatment as pt willnot exceed this power level due to her high SMTof 1.60.Patient departed post-treatment with no reported concerns.

## 2017-05-07 ENCOUNTER — Other Ambulatory Visit (INDEPENDENT_AMBULATORY_CARE_PROVIDER_SITE_OTHER): Payer: BLUE CROSS/BLUE SHIELD | Admitting: Emergency Medicine

## 2017-05-07 DIAGNOSIS — F332 Major depressive disorder, recurrent severe without psychotic features: Secondary | ICD-10-CM | POA: Diagnosis not present

## 2017-05-07 NOTE — Progress Notes (Signed)
Patient reported to [facility] for Repetitive Transcranial Magnetic Stimulation treatment for severe episode of recurrent major depressive disorder, without psychotic features. Patient presented with appropriate affect, level mood and denied any suicidal or homicidal ideations. Patient stated that she did not experience any side effects (i.e, headaches) during the weekend.. Patient reported no change in medications,alcohol/substane use, caffeine consumption, sleep pattern or metal implant status since previous treatment. Patient watched tv. Pt tried something different- dyed her hair. She is happy about the positive change. Pt was monitored closely during tx due to her high power level. Power leveled at 100%for the duration of treatment as pt willnot exceed this power level due to her high SMTof 1.60.Patient departed post-treatment with no reported concerns. 

## 2017-05-08 ENCOUNTER — Encounter (HOSPITAL_COMMUNITY): Payer: Self-pay

## 2017-05-09 ENCOUNTER — Other Ambulatory Visit (INDEPENDENT_AMBULATORY_CARE_PROVIDER_SITE_OTHER): Payer: BLUE CROSS/BLUE SHIELD | Admitting: Emergency Medicine

## 2017-05-09 DIAGNOSIS — F332 Major depressive disorder, recurrent severe without psychotic features: Secondary | ICD-10-CM | POA: Diagnosis not present

## 2017-05-09 NOTE — Progress Notes (Signed)
[facility] for Repetitive Transcranial Magnetic Stimulation treatment for severe episode of recurrent major depressive disorder, without psychotic features. Patient presented with appropriate affect, level mood and denied any suicidal or homicidal ideations. Patient stated that she did not experience any side effects (i.e, headaches) during the weekend. Patient reported no change in medications,alcohol/substane use, caffeine consumption, sleep pattern or metal implant status since previous treatment. Patient and writer spoke about manifestation and pt disclosed things that she desired to happen.Pt was monitored closely during tx due to her high power level. Power leveled at 100%for the duration of treatment as pt willnot exceed this power level due to her high SMTof 1.60.Patient departed post-treatment with no reported concerns.

## 2017-05-10 ENCOUNTER — Other Ambulatory Visit (INDEPENDENT_AMBULATORY_CARE_PROVIDER_SITE_OTHER): Payer: BLUE CROSS/BLUE SHIELD | Admitting: Emergency Medicine

## 2017-05-10 DIAGNOSIS — F332 Major depressive disorder, recurrent severe without psychotic features: Secondary | ICD-10-CM | POA: Diagnosis not present

## 2017-05-10 NOTE — Progress Notes (Signed)
Patient [facility] for Repetitive Transcranial Magnetic Stimulation treatment for severe episode of recurrent major depressive disorder, without psychotic features. Patient presented with appropriate affect, level mood and denied any suicidal or homicidal ideations. Patient stated that she did not experience any side effects (i.e, headaches) during the weekend. Patient reported no change in medications,alcohol/substane use, caffeine consumption, sleep pattern or metal implant status since previous treatment. Patient was quiet and was on her phone.Pt was monitored closely during tx due to her high power level. Power leveled at 100%for the duration of treatment as pt willnot exceed this power level due to her high SMTof 1.60.Patient departed post-treatment with no reported concerns.

## 2017-05-13 ENCOUNTER — Encounter (HOSPITAL_COMMUNITY): Payer: Self-pay

## 2017-05-13 ENCOUNTER — Other Ambulatory Visit (INDEPENDENT_AMBULATORY_CARE_PROVIDER_SITE_OTHER): Payer: BLUE CROSS/BLUE SHIELD | Admitting: Emergency Medicine

## 2017-05-13 DIAGNOSIS — F332 Major depressive disorder, recurrent severe without psychotic features: Secondary | ICD-10-CM

## 2017-05-13 NOTE — Progress Notes (Signed)
Patient reported to [facility] for Repetitive Transcranial Magnetic Stimulation treatment for severe episode of recurrent major depressive disorder, without psychotic features. Patient presented with appropriate affect, level mood and denied any suicidal or homicidal ideations. Patient stated that she did not experience any side effects (i.e, headaches) during the weekend. Patient reported no change in medications,alcohol/substane use, caffeine consumption, sleep pattern or metal implant status since previous treatment. Patient was quiet and was on her phone.Pt was monitored closely during tx due to her high power level. Power leveled at 100%for the duration of treatment as pt willnot exceed this power level due to her high SMTof 1.60.Patient departed post-treatment with no reported concerns. 

## 2017-05-14 ENCOUNTER — Other Ambulatory Visit (INDEPENDENT_AMBULATORY_CARE_PROVIDER_SITE_OTHER): Payer: BLUE CROSS/BLUE SHIELD | Admitting: Emergency Medicine

## 2017-05-14 DIAGNOSIS — F332 Major depressive disorder, recurrent severe without psychotic features: Secondary | ICD-10-CM | POA: Diagnosis not present

## 2017-05-14 NOTE — Progress Notes (Signed)
[facility] for Repetitive Transcranial Magnetic Stimulation treatment for severe episode of recurrent major depressive disorder, without psychotic features. Patient presented with appropriate affect, level mood and denied any suicidal or homicidal ideations. Patient stated that she did not experience any side effects (i.e, headaches) during the weekend. Patient reported no change in medications,alcohol/substane use, caffeine consumption, sleep pattern or metal implant status since previous treatment. Patient was quiet and was on her phone.Pt was monitored closely during tx due to her high power level. Power leveled at 100%for the duration of treatment as pt willnot exceed this power level due to her high SMTof 1.60.Patient departed post-treatment with no reported concerns.

## 2017-05-15 ENCOUNTER — Other Ambulatory Visit (INDEPENDENT_AMBULATORY_CARE_PROVIDER_SITE_OTHER): Payer: BLUE CROSS/BLUE SHIELD | Admitting: Emergency Medicine

## 2017-05-15 DIAGNOSIS — F332 Major depressive disorder, recurrent severe without psychotic features: Secondary | ICD-10-CM | POA: Diagnosis not present

## 2017-05-15 NOTE — Progress Notes (Signed)
[facility] for Repetitive Transcranial Magnetic Stimulation treatment for severe episode of recurrent major depressive disorder, without psychotic features. Patient presented with appropriate affect, level mood and denied any suicidal or homicidal ideations. Patient stated that she did not experience any side effects (i.e, headaches) during the weekend. Patient reported no change in medications,alcohol/substane use, caffeine consumption, sleep pattern or metal implant status since previous treatment. Patient spoke with Clinical research associatewriter. Pt shared stories about her family.Pt was monitored closely during tx due to her high power level. Power leveled at 100%for the duration of treatment as pt willnot exceed this power level due to her high SMTof 1.60.Patient departed post-treatment with no reported concerns.

## 2017-05-16 ENCOUNTER — Other Ambulatory Visit (INDEPENDENT_AMBULATORY_CARE_PROVIDER_SITE_OTHER): Payer: BLUE CROSS/BLUE SHIELD | Admitting: Emergency Medicine

## 2017-05-16 DIAGNOSIS — F332 Major depressive disorder, recurrent severe without psychotic features: Secondary | ICD-10-CM | POA: Diagnosis not present

## 2017-05-16 NOTE — Progress Notes (Signed)
[facility] Repetitive Transcranial Magnetic Stimulation treatment for severe episode of recurrent major depressive disorder, without psychotic features. Patient presented with appropriate affect, level mood and denied any suicidal or homicidal ideations. Patient stated that she did not experience any side effects (i.e, headaches) during the weekend. Patient reported no change in medications,alcohol/substane use, caffeine consumption, sleep pattern or metal implant status since previous treatment. Patient watched tv.Pt was monitored closely during tx due to her high power level. Power leveled at 100%for the duration of treatment as pt willnot exceed this power level due to her high SMTof 1.60.Patient departed post-treatment with no reported concerns.

## 2017-05-17 ENCOUNTER — Other Ambulatory Visit (INDEPENDENT_AMBULATORY_CARE_PROVIDER_SITE_OTHER): Payer: BLUE CROSS/BLUE SHIELD | Admitting: Emergency Medicine

## 2017-05-17 DIAGNOSIS — F332 Major depressive disorder, recurrent severe without psychotic features: Secondary | ICD-10-CM

## 2017-05-17 NOTE — Progress Notes (Signed)
Patient reported to [facility] for Repetitive Transcranial Magnetic Stimulation treatment for severe episode of recurrent major depressive disorder, without psychotic features. Patient presented with appropriate affect, level mood and denied any suicidal or homicidal ideations. Patient stated that she did not experience any side effects (i.e, headaches) during the weekend. Patient reported no change in medications,alcohol/substane use, caffeine consumption, sleep pattern or metal implant status since previous treatment. Patient watched tv.Pt was monitored closely during tx due to her high power level. Power leveled at 100%for the duration of treatment as pt willnot exceed this power level due to her high SMTof 1.60.Patient departed post-treatment with no reported concerns. 

## 2017-05-20 ENCOUNTER — Other Ambulatory Visit (INDEPENDENT_AMBULATORY_CARE_PROVIDER_SITE_OTHER): Payer: BLUE CROSS/BLUE SHIELD | Admitting: Emergency Medicine

## 2017-05-20 DIAGNOSIS — F332 Major depressive disorder, recurrent severe without psychotic features: Secondary | ICD-10-CM

## 2017-05-20 NOTE — Progress Notes (Signed)
[facility] for Repetitive Transcranial Magnetic Stimulation treatment for severe episode of recurrent major depressive disorder, without psychotic features. Patient presented with appropriate affect, level mood and denied any suicidal or homicidal ideations. . Patient reported no change in medications,alcohol/substane use, caffeine consumption, sleep pattern or metal implant status since previous treatment. Patient watched tv.Pt was monitored closely during tx due to her high power level. Power leveled at 100%for the duration of treatment as pt willnot exceed this power level due to her high SMTof 1.60.Patient departed post-treatment with no reported concerns.

## 2017-05-21 ENCOUNTER — Other Ambulatory Visit (INDEPENDENT_AMBULATORY_CARE_PROVIDER_SITE_OTHER): Payer: BLUE CROSS/BLUE SHIELD | Admitting: Emergency Medicine

## 2017-05-21 DIAGNOSIS — F332 Major depressive disorder, recurrent severe without psychotic features: Secondary | ICD-10-CM

## 2017-05-21 NOTE — Progress Notes (Signed)
Patient reported to [facility] for Repetitive Transcranial Magnetic Stimulation treatment for severe episode of recurrent major depressive disorder, without psychotic features. Patient presented with appropriate affect, level mood and denied any suicidal or homicidal ideations. . Patient reported no change in medications,alcohol/substane use, caffeine consumption, sleep pattern or metal implant status since previous treatment. Patient watched tv.Pt was monitored closely during tx due to her high power level. Power leveled at 100%for the duration of treatment as pt willnot exceed this power level due to her high SMTof 1.60.Patient departed post-treatment with no reported concerns. 

## 2017-05-22 ENCOUNTER — Other Ambulatory Visit (INDEPENDENT_AMBULATORY_CARE_PROVIDER_SITE_OTHER): Payer: BLUE CROSS/BLUE SHIELD | Admitting: Emergency Medicine

## 2017-05-22 DIAGNOSIS — F332 Major depressive disorder, recurrent severe without psychotic features: Secondary | ICD-10-CM | POA: Diagnosis not present

## 2017-05-22 NOTE — Progress Notes (Signed)
Patient reported to [facility] for Repetitive Transcranial Magnetic Stimulation treatment for severe episode of recurrent major depressive disorder, without psychotic features. Patient presented with appropriate affect, level mood and denied any suicidal or homicidal ideations. Patient reported no change in medications,alcohol/substane use, caffeine consumption, sleep pattern or metal implant status since previous treatment. Patient was on her phone during tx. Pt was monitored closely during tx due to her high power level. Power leveled at 100%for the duration of treatment as pt willnot exceed this power level due to her high SMTof 1.60.Patient departed post-treatment with no reported concerns. 

## 2017-05-23 ENCOUNTER — Other Ambulatory Visit (INDEPENDENT_AMBULATORY_CARE_PROVIDER_SITE_OTHER): Payer: BLUE CROSS/BLUE SHIELD | Admitting: Emergency Medicine

## 2017-05-23 DIAGNOSIS — F332 Major depressive disorder, recurrent severe without psychotic features: Secondary | ICD-10-CM

## 2017-05-23 NOTE — Progress Notes (Signed)
Patient reported to [facility] for Repetitive Transcranial Magnetic Stimulation treatment for severe episode of recurrent major depressive disorder, without psychotic features. Patient presented with appropriate affect, level mood and denied any suicidal or homicidal ideations. Patient reported no change in medications,alcohol/substane use, caffeine consumption, sleep pattern or metal implant status since previous treatment. Patient was on her phone during tx. Pt was monitored closely during tx due to her high power level. Power leveled at 100%for the duration of treatment as pt willnot exceed this power level due to her high SMTof 1.60.Patient departed post-treatment with no reported concerns. 

## 2017-05-24 ENCOUNTER — Other Ambulatory Visit (INDEPENDENT_AMBULATORY_CARE_PROVIDER_SITE_OTHER): Payer: BLUE CROSS/BLUE SHIELD | Admitting: Emergency Medicine

## 2017-05-24 DIAGNOSIS — F332 Major depressive disorder, recurrent severe without psychotic features: Secondary | ICD-10-CM

## 2017-05-24 NOTE — Progress Notes (Signed)
[facility] for Repetitive Transcranial Magnetic Stimulation treatment for severe episode of recurrent major depressive disorder, without psychotic features. Patient presented with appropriate affect, level mood and denied any suicidal or homicidal ideations. Patient reported no change in medications,alcohol/substane use, caffeine consumption, sleep pattern or metal implant status since previous treatment. Patient was on her phone during tx.Pt completed PHQ-9 questionnaire and scored an 8. Pt was monitored closely during tx due to her high power level. Power leveled at 100%for the duration of treatment as pt willnot exceed this power level due to her high SMTof 1.60.Patient departed post-treatment with no reported concerns.

## 2017-05-27 ENCOUNTER — Other Ambulatory Visit (INDEPENDENT_AMBULATORY_CARE_PROVIDER_SITE_OTHER): Payer: BLUE CROSS/BLUE SHIELD | Admitting: Emergency Medicine

## 2017-05-27 DIAGNOSIS — F332 Major depressive disorder, recurrent severe without psychotic features: Secondary | ICD-10-CM

## 2017-05-27 NOTE — Progress Notes (Signed)
[facility] for Repetitive Transcranial Magnetic Stimulation treatment for severe episode of recurrent major depressive disorder, without psychotic features. Patient presented with appropriate affect, level mood and denied any suicidal or homicidal ideations. Patient reported no change in medications,alcohol/substane use, caffeine consumption, sleep pattern or metal implant status since previous treatment. Patient was on her phone during tx.Pt shared about her trip to [place]. Pt seemed rejuvenated and happier. Pt also shared other things she did this past weekend - pt seemed more talkative then usual. Pt was monitored closely during tx due to her high power level. Power leveled at 100%for the duration of treatment as pt willnot exceed this power level due to her high SMTof 1.60.Patient departed post-treatment with no reported concerns.

## 2017-05-28 ENCOUNTER — Encounter (HOSPITAL_COMMUNITY): Payer: Self-pay | Admitting: Emergency Medicine

## 2017-05-29 ENCOUNTER — Other Ambulatory Visit (INDEPENDENT_AMBULATORY_CARE_PROVIDER_SITE_OTHER): Payer: BLUE CROSS/BLUE SHIELD | Admitting: Emergency Medicine

## 2017-05-29 DIAGNOSIS — F332 Major depressive disorder, recurrent severe without psychotic features: Secondary | ICD-10-CM

## 2017-05-29 NOTE — Progress Notes (Signed)
[facility] for Repetitive Transcranial Magnetic Stimulation treatment for severe episode of recurrent major depressive disorder, without psychotic features. Patient presented with appropriate affect, level mood and denied any suicidal or homicidal ideations. Patient reported no change in medications,alcohol/substane use, caffeine consumption, sleep pattern or metal implant status since previous treatment. Patient was on her phone during tx. Pt was monitored closely during tx due to her high power level. Power leveled at 100%for the duration of treatment as pt willnot exceed this power level due to her high SMTof 1.60.Patient departed post-treatment with no reported concerns.

## 2017-06-03 ENCOUNTER — Other Ambulatory Visit (INDEPENDENT_AMBULATORY_CARE_PROVIDER_SITE_OTHER): Payer: BLUE CROSS/BLUE SHIELD | Admitting: Emergency Medicine

## 2017-06-03 DIAGNOSIS — F332 Major depressive disorder, recurrent severe without psychotic features: Secondary | ICD-10-CM | POA: Diagnosis not present

## 2017-06-03 NOTE — Progress Notes (Signed)
Patient reported to [facility] for Repetitive Transcranial Magnetic Stimulation treatment for severe episode of recurrent major depressive disorder, without psychotic features. Patient presented with appropriate affect, level mood and denied any suicidal or homicidal ideations. Patient reported no change in medications,alcohol/substane use, caffeine consumption, sleep pattern or metal implant status since previous treatment. Patient was on her phone during tx. Pt stated that had a good weekend. Pt was monitored closely during tx due to her high power level. Power leveled at 100%for the duration of treatment as pt willnot exceed this power level due to her high SMTof 1.60.Patient departed post-treatment with no reported concerns.

## 2017-06-04 ENCOUNTER — Other Ambulatory Visit (INDEPENDENT_AMBULATORY_CARE_PROVIDER_SITE_OTHER): Payer: BLUE CROSS/BLUE SHIELD | Admitting: Emergency Medicine

## 2017-06-04 DIAGNOSIS — F332 Major depressive disorder, recurrent severe without psychotic features: Secondary | ICD-10-CM

## 2017-06-04 NOTE — Progress Notes (Signed)
[facility] for Repetitive Transcranial Magnetic Stimulation treatment for severe episode of recurrent major depressive disorder, without psychotic features. Patient presented with appropriate affect, level mood and denied any suicidal or homicidal ideations. Patient reported no change in medications,alcohol/substane use, caffeine consumption, sleep pattern or metal implant status since previous treatment. Patient was on her phone and watching tv during tx. Pt was monitored closely during tx due to her high power level. Power leveled at 100%for the duration of treatment as pt willnot exceed this power level due to her high SMTof 1.60.Patient departed post-treatment with no reported concerns.

## 2017-06-05 ENCOUNTER — Encounter (HOSPITAL_COMMUNITY): Payer: Self-pay | Admitting: Emergency Medicine

## 2017-06-10 ENCOUNTER — Encounter (HOSPITAL_COMMUNITY): Payer: Self-pay | Admitting: Emergency Medicine

## 2017-06-11 ENCOUNTER — Other Ambulatory Visit (HOSPITAL_COMMUNITY): Payer: BLUE CROSS/BLUE SHIELD | Attending: Psychiatry | Admitting: Emergency Medicine

## 2017-06-11 ENCOUNTER — Ambulatory Visit (INDEPENDENT_AMBULATORY_CARE_PROVIDER_SITE_OTHER): Payer: BLUE CROSS/BLUE SHIELD | Admitting: Psychiatry

## 2017-06-11 VITALS — BP 126/72 | HR 80 | Ht 66.0 in | Wt 200.2 lb

## 2017-06-11 DIAGNOSIS — Z9114 Patient's other noncompliance with medication regimen: Secondary | ICD-10-CM

## 2017-06-11 DIAGNOSIS — F332 Major depressive disorder, recurrent severe without psychotic features: Secondary | ICD-10-CM | POA: Diagnosis not present

## 2017-06-11 MED ORDER — VENLAFAXINE HCL ER 150 MG PO CP24: 300.0000 mg | ORAL_CAPSULE | Freq: Every day | ORAL | 0 refills | Status: DC

## 2017-06-11 NOTE — Progress Notes (Signed)
[employer] MD/Renee/NP OP Progress Note  06/11/2017 10:59 AM Renee Turner  MRN:  [id number]  Chief Complaint: Renee Turner, med check  Subjective:  Renee Turner presents today for med management and [place] follow-up.  She reports that she continues to be fairly nonadherent to her medications. Takes the Lamictal, Effexor, Wellbutrin approximately 3 days out of the week. She reports that she remembers, but she has trouble making peace and being okay with taking this many medicines. Expressed my concern about the on and off medication effects, and including Renee Turner syndrome. We agreed on a compromise to increase Effexor to 300 mg, and to discontinue Lamictal and Wellbutrin.  She continues with Renee Turner and with individual therapy. She continues to feel fatigued and depressed at times. She denies any suicidal thoughts, and does feel that Renee Turner has helped to some degree. She reports that some of her friends tell her she is more sociable and outgoing. She is sleeping okay at night. She continues to enjoy her work, but reports that she has been a bit more stressed out lately with her work [employer]. I spent time encouraging her to take Effexor monotherapy daily, to give this a real try as to whether or not it's going to benefit her mood.  We also spent time discussing the use of an antidepressant patch given her poor adherence. Specifically this would be Emsam patch, which is the only antidepressant patch on the market. Alternatively, other options might be ECT or repeat round of Renee Turner if needed in the future.  Visit Diagnosis:    ICD-10-CM   1. Nonadherence to medication Z91.14   2. Severe episode of recurrent major depressive disorder, without psychotic features ([name]) F33.2 venlafaxine XR (EFFEXOR-XR) 150 MG 24 hr capsule    DISCONTINUED: venlafaxine XR (EFFEXOR-XR) 150 MG 24 hr capsule     Past Psychiatric History: See intake H&P for full details. Reviewed, with no updates at this time.  Past Medical History:  Past Medical  History:  Diagnosis Date  . Anxiety   . Depression   . Diabetes mellitus without complication ([place])    Type II  . Diabetes mellitus, type II ([place])    No past surgical history on file.  Family Psychiatric History: See intake H&P for full details. Reviewed, with no updates at this time.   Family History: No family history on file.  Social History:  Social History   Social History  . Marital status: Single    Spouse name: N/A  . Number of children: N/A  . Years of education: N/A   Social History Main Topics  . Smoking status: Never Smoker  . Smokeless tobacco: Never Used  . Alcohol use No  . Drug use: No  . Sexual activity: No   Other Topics Concern  . Not on file   Social History Narrative  . No narrative on file    Allergies: No Known Allergies  Metabolic Disorder Labs: No results found for: HGBA1C, MPG No results found for: PROLACTIN No results found for: CHOL, TRIG, HDL, CHOLHDL, VLDL, LDLCALC   Current Medications: Current Outpatient Prescriptions  Medication Sig Dispense Refill  . dapagliflozin propanediol (FARXIGA) 10 MG TABS tablet Take 10 mg by mouth.    . Exenatide ER (BYDUREON) 2 MG PEN Inject 2 mg into the skin once a week.    . Ferrous Sulfate (IRON) 325 (65 Fe) MG TABS Take 325 mg by mouth daily.  3  . gabapentin (NEURONTIN) 300 MG capsule Take 1 capsule by mouth 3 (three)  times daily.  2  . metFORMIN (GLUCOPHAGE-XR) 500 MG 24 hr tablet Take 1,000 mg by mouth 2 (two) times daily.  3  . venlafaxine XR (EFFEXOR-XR) 150 MG 24 hr capsule Take 2 capsules (300 mg total) by mouth daily. 180 capsule 0   No current facility-administered medications for this visit.     Neurologic: Headache: Negative Seizure: Negative Paresthesias: Negative  Musculoskeletal: Strength & Muscle Tone: within normal limits Gait & Station: normal Patient leans: N/A  Psychiatric Specialty Exam: Review of Systems  Constitutional: Negative.   HENT: Negative.    Respiratory: Negative.   Cardiovascular: Negative.   Genitourinary: Negative.   Musculoskeletal: Negative.   Neurological: Negative.   Psychiatric/Behavioral: Positive for depression. Negative for hallucinations, memory loss, substance abuse and suicidal ideas. The patient is not nervous/anxious and does not have insomnia.     Blood pressure 126/72, pulse 80, height _0  (1.676 m), weight 200 lb 3.2 oz (90.8 kg).Body mass index is 32.31 kg/m.  General Appearance: Casual and Fairly Groomed  Eye Contact:  Good  Speech:  Clear and Coherent  Volume:  Decreased  Mood:  Euthymic  Affect:  Constricted and Depressed  Thought Process:  Coherent  Orientation:  Full (Time, Place, and Person)  Thought Content: Logical   Suicidal Thoughts:  No  Homicidal Thoughts:  No  Memory:  Immediate;   Good  Judgement:  Fair  Insight:  Present and Shallow  Psychomotor Activity:  Normal  Concentration:  Concentration: Good  Recall:  Good  Fund of Knowledge: Good  Language: Good  Akathisia:  Negative  Handed:  Right  AIMS (if indicated):  na  Assets:  Communication Skills Desire for Improvement Financial Resources/Insurance Housing Leisure Time Physical Health Social Support Talents/Skills Transportation Vocational/Educational  ADL's:  Intact  Cognition: WNL  Sleep:  6-8 hours nightly    Treatment Plan Summary: Renee Turner is a 21 year-old female with severe recurrent major depressive disorder Without psychotic features. She has always struggled with very poor medication adherence, and has had some interval improvement with Renee Turner. Given her continued nonadherence with Wellbutrin and Lamictal and Effexor, we agreed to discontinue Wellbutrin and Lamictal, and increase Effexor to 300 mg once daily. If she continues to have poor adherence with medications, we may consider exploring Emsam patch or ECT.   1. Nonadherence to medication   2. Severe episode of recurrent major depressive disorder,  without psychotic features ([place])     Discontinue Lamictal given intermittent adherence Discontinue Wellbutrin given intermittent adherence Increase Effexor to 300 mg XL daily, encourage daily alarm on her phone [facility] Continue [place] for 36 treatment course RTC 10 weeks  Aundra Dubin, MD 06/11/2017, 10:59 AM

## 2017-06-11 NOTE — Patient Instructions (Signed)
STOP Wellbutrin     STOP Lamictal   STOP Hydroxyzine   Take Effexor  2 TABLETS EVERY MORNING (total of 300 mg daily)

## 2017-06-11 NOTE — Progress Notes (Signed)
[facility] for Repetitive Transcranial Magnetic Stimulation treatment for severe episode of recurrent major depressive disorder, without psychotic features. Patient presented with appropriate affect, level mood and denied any suicidal or homicidal ideations. Patient reported no change in medications,alcohol/substane use, caffeine consumption, sleep pattern or metal implant status since previous treatment. Patient was on her phone and watching tv during tx.Pt stated that her friends have been seeing a difference in her mood. Pt was monitored closely during tx due to her high power level. Power leveled at 100%for the duration of treatment as pt willnot exceed this power level due to her high SMTof 1.60.Patient departed post-treatment with no reported concerns.

## 2017-06-12 ENCOUNTER — Other Ambulatory Visit (INDEPENDENT_AMBULATORY_CARE_PROVIDER_SITE_OTHER): Payer: BLUE CROSS/BLUE SHIELD | Admitting: Emergency Medicine

## 2017-06-12 DIAGNOSIS — F332 Major depressive disorder, recurrent severe without psychotic features: Secondary | ICD-10-CM

## 2017-06-12 NOTE — Progress Notes (Signed)
Patient reported to  [facility] for Repetitive Transcranial Magnetic Stimulation treatment for severe episode of recurrent major depressive disorder, without psychotic features. Patient presented with appropriate affect, level mood and denied any suicidal or homicidal ideations. Patient reported no change in medications,alcohol/substane use, caffeine consumption, sleep pattern or metal implant status since previous treatment. Patient was on her phone and watching tv during tx.Pt stated that her friends have been seeing a difference in her mood. Pt was monitored closely during tx due to her high power level. Power leveled at 100%for the duration of treatment as pt willnot exceed this power level due to her high SMTof 1.60.Patient departed post-treatment with no reported concerns. 

## 2017-06-13 ENCOUNTER — Other Ambulatory Visit (INDEPENDENT_AMBULATORY_CARE_PROVIDER_SITE_OTHER): Payer: BLUE CROSS/BLUE SHIELD | Admitting: Emergency Medicine

## 2017-06-13 DIAGNOSIS — F332 Major depressive disorder, recurrent severe without psychotic features: Secondary | ICD-10-CM | POA: Diagnosis not present

## 2017-06-13 NOTE — Progress Notes (Signed)
Patient [facility] for Repetitive Transcranial Magnetic Stimulation treatment for severe episode of recurrent major depressive disorder, without psychotic features. Patient presented with appropriate affect, level mood and denied any suicidal or homicidal ideations. Patient reported no change in medications,alcohol/substane use, caffeine consumption, sleep pattern or metal implant status since previous treatment. Patient was on her phone and watching tvduring tx.Pt stated that it was hard for her to wake up this morning for the early appointment. Pt was monitored closely during tx due to her high power level. Power leveled at 100%for the duration of treatment as pt willnot exceed this power level due to her high SMTof 1.60.Patient departed post-treatment with no reported concerns.

## 2017-06-17 ENCOUNTER — Other Ambulatory Visit (INDEPENDENT_AMBULATORY_CARE_PROVIDER_SITE_OTHER): Payer: BLUE CROSS/BLUE SHIELD | Admitting: Emergency Medicine

## 2017-06-17 DIAGNOSIS — F332 Major depressive disorder, recurrent severe without psychotic features: Secondary | ICD-10-CM | POA: Diagnosis not present

## 2017-06-17 NOTE — Progress Notes (Signed)
[facility] for Repetitive Transcranial Magnetic Stimulation treatment for severe episode of recurrent major depressive disorder, without psychotic features. Patient presented with appropriate affect, level mood and denied any suicidal or homicidal ideations. Patient reported no change in medications,alcohol/substane use, caffeine consumption, sleep pattern or metal implant status since previous treatment. Patient was on her phone and watching tvduring tx.Pt was monitored closely during tx due to her high power level. Power leveled at 100%for the duration of treatment as pt willnot exceed this power level due to her high SMTof 1.60.Patient departed post-treatment with no reported concerns.

## 2017-06-18 ENCOUNTER — Other Ambulatory Visit (INDEPENDENT_AMBULATORY_CARE_PROVIDER_SITE_OTHER): Payer: BLUE CROSS/BLUE SHIELD | Admitting: Emergency Medicine

## 2017-06-18 DIAGNOSIS — F332 Major depressive disorder, recurrent severe without psychotic features: Secondary | ICD-10-CM | POA: Diagnosis not present

## 2017-06-18 NOTE — Progress Notes (Signed)
Patient reported to [facility] for Repetitive Transcranial Magnetic Stimulation treatment for severe episode of recurrent major depressive disorder, without psychotic features. Patient presented with appropriate affect, level mood and denied any suicidal or homicidal ideations. Patient reported no change in medications,alcohol/substane use, caffeine consumption, sleep pattern or metal implant status since previous treatment. Patient was on her phone and watching tvduring tx.Pt was monitored closely during tx due to her high power level. Power leveled at 100%for the duration of treatment as pt willnot exceed this power level due to her high SMTof 1.60.Patient departed post-treatment with no reported concerns. 

## 2017-06-20 ENCOUNTER — Other Ambulatory Visit (INDEPENDENT_AMBULATORY_CARE_PROVIDER_SITE_OTHER): Payer: BLUE CROSS/BLUE SHIELD | Admitting: Emergency Medicine

## 2017-06-20 DIAGNOSIS — F332 Major depressive disorder, recurrent severe without psychotic features: Secondary | ICD-10-CM

## 2017-06-20 NOTE — Progress Notes (Signed)
Patient reported to [facility] for Repetitive Transcranial Magnetic Stimulation treatment for severe episode of recurrent major depressive disorder, without psychotic features. Patient presented with appropriate affect, level mood and denied any suicidal or homicidal ideations. Patient reported no change in medications,alcohol/substane use, caffeine consumption, sleep pattern or metal implant status since previous treatment. Patient was on her phone and watching tvduring tx.Pt was monitored closely during tx due to her high power level. Power leveled at 100%for the duration of treatment as pt willnot exceed this power level due to her high SMTof 1.60.Patient departed post-treatment with no reported concerns. 

## 2017-06-24 ENCOUNTER — Other Ambulatory Visit (INDEPENDENT_AMBULATORY_CARE_PROVIDER_SITE_OTHER): Payer: BLUE CROSS/BLUE SHIELD | Admitting: Emergency Medicine

## 2017-06-24 DIAGNOSIS — F332 Major depressive disorder, recurrent severe without psychotic features: Secondary | ICD-10-CM

## 2017-06-24 NOTE — Progress Notes (Signed)
Patient reported to [facility] for Repetitive Transcranial Magnetic Stimulation treatment for severe episode of recurrent major depressive disorder, without psychotic features. Patient presented with appropriate affect, level mood and denied any suicidal or homicidal ideations. Patient reported no change in medications,alcohol/substane use, caffeine consumption, sleep pattern or metal implant status since previous treatment. Pt watched tv and was on her phone.Pt was monitored closely during tx due to her high power level. Power leveled at 100%for the duration of treatment as pt willnot exceed this power level due to her high SMTof 1.60.Patient departed post-treatment with no reported concerns.

## 2017-06-25 ENCOUNTER — Other Ambulatory Visit (INDEPENDENT_AMBULATORY_CARE_PROVIDER_SITE_OTHER): Payer: BLUE CROSS/BLUE SHIELD | Admitting: Emergency Medicine

## 2017-06-25 DIAGNOSIS — F332 Major depressive disorder, recurrent severe without psychotic features: Secondary | ICD-10-CM | POA: Diagnosis not present

## 2017-06-25 NOTE — Progress Notes (Signed)
Patient reported to [facility] for Repetitive Transcranial Magnetic Stimulation treatment for severe episode of recurrent major depressive disorder, without psychotic features. Patient presented with appropriate affect, level mood and denied any suicidal or homicidal ideations. Patient reported no change in medications,alcohol/substane use, caffeine consumption, sleep pattern or metal implant status since previous treatment. Patient expressed that she feels better and shared that other people have noticed a difference. Pt was monitored closely during tx due to her high power level. Power leveled at 100%for the duration of treatment as pt willnot exceed this power level due to her high SMTof 1.60.Patient departed post-treatment with no reported concerns.

## 2017-07-01 ENCOUNTER — Other Ambulatory Visit (INDEPENDENT_AMBULATORY_CARE_PROVIDER_SITE_OTHER): Payer: BLUE CROSS/BLUE SHIELD | Admitting: Emergency Medicine

## 2017-07-01 DIAGNOSIS — F332 Major depressive disorder, recurrent severe without psychotic features: Secondary | ICD-10-CM

## 2017-07-01 NOTE — Progress Notes (Signed)
Patient reported to [facility] for Repetitive Transcranial Magnetic Stimulation treatment for severe episode of recurrent major depressive disorder, without psychotic features. Patient presented with appropriate affect, level mood and denied any suicidal or homicidal ideations. Patient reported no change in medications,alcohol/substane use, caffeine consumption, sleep pattern or metal implant status since previous treatment. Today is patient's last day. Pt stated that TMS has helped her overall. Pt shared that she has a timid personality and does not talk much - did not expect that to change. Pt completed PHQ-9 questionnaire and scored a 4. Pt was monitored closely during tx due to her high power level. Power leveled at 100%for the duration of treatment as pt willnot exceed this power level due to her high SMTof 1.60.Patient departed post-treatment with no reported concerns.

## 2017-08-06 ENCOUNTER — Ambulatory Visit (INDEPENDENT_AMBULATORY_CARE_PROVIDER_SITE_OTHER): Payer: BLUE CROSS/BLUE SHIELD | Admitting: Psychiatry

## 2017-08-06 ENCOUNTER — Encounter (HOSPITAL_COMMUNITY): Payer: Self-pay | Admitting: Psychiatry

## 2017-08-06 VITALS — BP 120/74 | HR 76 | Ht 66.0 in | Wt 207.4 lb

## 2017-08-06 DIAGNOSIS — E1142 Type 2 diabetes mellitus with diabetic polyneuropathy: Secondary | ICD-10-CM | POA: Diagnosis not present

## 2017-08-06 DIAGNOSIS — F5101 Primary insomnia: Secondary | ICD-10-CM | POA: Diagnosis not present

## 2017-08-06 DIAGNOSIS — F332 Major depressive disorder, recurrent severe without psychotic features: Secondary | ICD-10-CM | POA: Diagnosis not present

## 2017-08-06 DIAGNOSIS — Z79899 Other long term (current) drug therapy: Secondary | ICD-10-CM

## 2017-08-06 MED ORDER — NORTRIPTYLINE HCL 25 MG PO CAPS: 25.0000 mg | ORAL_CAPSULE | Freq: Every day | ORAL | 2 refills | Status: DC

## 2017-08-06 NOTE — Progress Notes (Signed)
[facility] MD/PA/NP OP Progress Note  08/06/2017 11:23 AM Renee Turner  MRN:  [id number]  Chief Complaint: tms follow-up  HPI: Renee Turner presents today for med management and [place] follow-up. She continues to present with a flat affect, and fairly introverted which is her baseline. She reports that she has not had any suicidal thoughts, which is an improvement. She reports that her friends of told her she is more conversational and present when they're interacting. She reports that she does still continue to have rumination and depressive symptoms, and feels stressed out about her work which she usually enjoys. She has not gone on any movie trips which she typically enjoys when her mood is good. She does try to go for a walk a few times a week.  Regarding medications, she continues on Effexor 150 mg, but admits that she takes this approximately 50% of the time. She reports that she is also fairly nonadherent to her diabetes medicines, taking them about 50% of the time as well.  She did not take Effexor today.    I spent time with the patient reviewing her response to [place], and her sense that this did help some, but her depressive symptoms continue on. We spent time reviewing the potential for ECT, but she is quite apprehensive about this. I spent time reviewing her past antidepressant trials including Wellbutrin, Lamictal, Zoloft. A common thread is that these medications were taken in the morning, and she tends to be fairly nonadherent to morning medications.    She reports that she does have trouble sleeping, and we agreed perhaps and nighttime medication may be more effective for her. Given the severity of her depression, and failure of multiple typical antidepressants, atypical antidepressants, and TMS, I'd like to proceed with tricyclic antidepressant. This may also be helpful for her neuropathic pain and insomnia. We reviewed the risks and benefits of nortriptyline and agreed to start at 25 mg nightly. She  will follow up in 6 weeks and we will consider a dose increase at that time. She continues in individual therapy.  Visit Diagnosis:    ICD-10-CM   1. Severe episode of recurrent major depressive disorder, without psychotic features ([place]) F33.2 nortriptyline (PAMELOR) 25 MG capsule  2. 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 Diabetic polyneuropathy associated with type 2 diabetes mellitus ([place]) E11.42     Past Psychiatric History: Patient recently completed a full course of 36 treatments of [place], last treatment was on 07/01/2017. She appeared to tolerate the treatment fairly well, most recent pHq9 score was a 4.  Past Medical History:  Past Medical History:  Diagnosis Date  . Anxiety   . Depression   . Diabetes mellitus without complication ([place])    Type II  . Diabetes mellitus, type II ([place])    History reviewed. No pertinent surgical history.  Family Psychiatric History: See intake H&P for full details. Reviewed, with no updates at this time.   Family History: History reviewed. No pertinent family history.  Social History:  Social History   Social History  . Marital status: Single    Spouse name: N/A  . Number of children: N/A  . Years of education: N/A   Social History Main Topics  . Smoking status: Never Smoker  . Smokeless tobacco: Never Used  . Alcohol use No  . Drug use: No  . Sexual activity: No   Other Topics Concern  . None   Social History Narrative  . None    Allergies: No Known Allergies  Metabolic Disorder Labs: No results found for: HGBA1C, MPG No results found for: PROLACTIN No results found for: CHOL, TRIG, HDL, CHOLHDL, VLDL, LDLCALC No results found for: TSH  Therapeutic Level Labs: No results found for: LITHIUM No results found for: VALPROATE No components found for:  CBMZ  Current Medications: Current Outpatient Prescriptions  Medication Sig Dispense Refill  . dapagliflozin propanediol (FARXIGA) 10 MG TABS tablet Take 10 mg by mouth.    .  docusate sodium (COLACE) 100 MG capsule Take 100 mg by mouth.    . Exenatide ER (BYDUREON) 2 MG PEN Inject 2 mg into the skin once a week.    . Ferrous Sulfate (IRON) 325 (65 Fe) MG TABS Take 325 mg by mouth daily.  3  . gabapentin (NEURONTIN) 300 MG capsule Take 1 capsule by mouth 3 (three) times daily.  2  . metFORMIN (GLUCOPHAGE-XR) 500 MG 24 hr tablet Take 1,000 mg by mouth 2 (two) times daily.  3  . nortriptyline (PAMELOR) 25 MG capsule Take 1 capsule (25 mg total) by mouth at bedtime. 30 capsule 2   No current facility-administered medications for this visit.      Musculoskeletal: Strength & Muscle Tone: within normal limits Gait & Station: normal Patient leans: N/A  Psychiatric Specialty Exam: Review of Systems  Constitutional: Negative.   HENT: Negative.   Respiratory: Negative.   Cardiovascular: Negative.   Gastrointestinal: Negative.   Musculoskeletal: Negative.   Neurological: Positive for tingling.  Psychiatric/Behavioral: Positive for depression. Negative for hallucinations, substance abuse and suicidal ideas. The patient is nervous/anxious and has insomnia.     Blood pressure 120/74, pulse 76, height _0  (1.676 m), weight 207 lb 6.4 oz (94.1 kg).Body mass index is 33.48 kg/m.  General Appearance: Casual and Well Groomed  Eye Contact:  Good  Speech:  Slow  Volume:  Decreased  Mood:  Dysphoric  Affect:  Blunt and Congruent, flat  Thought Process:  Goal Directed  Orientation:  Full (Time, Place, and Person)  Thought Content: Logical   Suicidal Thoughts:  No  Homicidal Thoughts:  No  Memory:  Immediate;   Good  Judgement:  Good  Insight:  Good  Psychomotor Activity:  Normal  Concentration:  Attention Span: Good  Recall:  Good  Fund of Knowledge: Good  Language: Good  Akathisia:  Negative  Handed:  Right  AIMS (if indicated): not done  Assets:  Communication Skills Desire for Improvement Financial Resources/Insurance Housing Leisure Time [place] Talents/Skills Transportation Vocational/Educational  ADL's:  Intact  Cognition: WNL  Sleep:  Good   Screenings: AIMS     Admission (Discharged) from 11/07/2016 in [facility] Total Score  0    AUDIT     Admission (Discharged) from 11/07/2016 in [facility]  Alcohol Use Disorder Identification Test Final Score (AUDIT)  0    PHQ2-9     Office Visit from 04/02/2017 in [facility]  PHQ-2 Total Score  5  PHQ-9 Total Score  23      Assessment and Plan: Renee Turner is a 21 year old female with a psychiatric history of major depressive disorder. She presents with long-standing history of depression, and her presentation is beginning to look more like a persistent depressive disorder. She has had multiple medication trials complicated by her generally consistent nonadherence, and she recently completed treatment with 36 rounds of [place]. This was met with some improvement in her mood. We spent today  discussing risks and benefits of trycyclic's and MAOI antidepressants.  We agreed to initiate nortriptyline 25 mg nightly, and to discontinue Effexor given her ongoing nonadherence to this. Perhaps aching the medication at bedtime, for treatment of insomnia and depression, may be more easy for her to remember. We have discussed behavioral strategies to improve adherence, and her insight into this treatment interfering behavior remains fairly poor.  If adherence with nortriptyline continues to be an issue, I will recommend we proceed with ECT re-referral or repeat round of [place] if patient is not willing to proceed with ECT.  1. Severe episode of recurrent major depressive disorder, without psychotic features ([place])   2. Primary insomnia   3. Diabetic polyneuropathy associated with type 2 diabetes mellitus ([name])    - Discontinue Effexor, patient is not taking Effexor on a daily basis,  and reports that when she is taking Effexor, she has only been taking 1 capsule. I have explored this with the patient to see if there are any side effects or intolerance, and she reports that she frankly just forgets to take it and doesn't have the motivation to take it when she does remember - Initiate nortriptyline 25 mg nightly for depression, insomnia, neuropathic pain - Consider repeat TMS versus referral for ECT   I spent 30 minutes with the patient in direct care and counseling. We discussed her treatment response to [place], continued mood symptoms, and her continued issues with medication nonadherence to both psychiatric and medical medications. He was receptive to the recommendations regarding medication changes.     Aundra Dubin, MD 08/06/2017, 11:23 AM

## 2017-09-23 ENCOUNTER — Encounter (HOSPITAL_COMMUNITY): Payer: Self-pay | Admitting: Psychiatry

## 2017-09-23 ENCOUNTER — Ambulatory Visit (INDEPENDENT_AMBULATORY_CARE_PROVIDER_SITE_OTHER): Payer: BLUE CROSS/BLUE SHIELD | Admitting: Psychiatry

## 2017-09-23 VITALS — BP 118/74 | HR 87 | Ht 66.0 in | Wt 210.6 lb

## 2017-09-23 DIAGNOSIS — F341 Dysthymic disorder: Secondary | ICD-10-CM

## 2017-09-23 DIAGNOSIS — F332 Major depressive disorder, recurrent severe without psychotic features: Secondary | ICD-10-CM | POA: Diagnosis not present

## 2017-09-23 DIAGNOSIS — Z79899 Other long term (current) drug therapy: Secondary | ICD-10-CM | POA: Diagnosis not present

## 2017-09-23 MED ORDER — NORTRIPTYLINE HCL 75 MG PO CAPS: 75.0000 mg | ORAL_CAPSULE | Freq: Every day | ORAL | 3 refills | Status: DC

## 2017-09-23 NOTE — Patient Instructions (Signed)
STOP pamelor 25 mg  START Pamelor 75 mg capsule (1 capsule)

## 2017-09-23 NOTE — Progress Notes (Signed)
[facility] MD/PA/NP OP Progress Note  09/23/2017 11:38 AM Renee Turner  MRN:  [id number]  Chief Complaint: Okay  HPI: Patient continues to present as depressed and flattened.  She reports that her mood is no different than at our last visit.  No worsening of depression and no improvement.  She continues to work consistently, and spends time with friends sometimes goes to the mall.  She reports that the Pamelor has helped a little bit with her sleep.  We agreed to increase to 75 mg nightly.  She agrees to follow-up with this Probation officer in 3 months or sooner if needed.  She shares that she has been much better at remembering to take the Pamelor, and takes the medicine about 80% of the time.   She has not seen her therapist in over 1 month, and reports that she is going to try to schedule an appointment to follow up with her soon.  I encouraged her to follow up on this, and we agreed to follow-up in 3 months.  Visit Diagnosis:    ICD-10-CM   1. Persistent depressive disorder F34.1   2. Severe episode of recurrent major depressive disorder, without psychotic features ([place]) F33.2 nortriptyline (PAMELOR) 75 MG capsule    Past Psychiatric History: See intake H&P for full details. Reviewed, with no updates at this time.   Past Medical History:  Past Medical History:  Diagnosis Date  . Anxiety   . Depression   . Diabetes mellitus without complication ([facility] [place])    Type II  . Diabetes mellitus, type II ([place])    History reviewed. No pertinent surgical history.  Family Psychiatric History: See intake H&P for full details. Reviewed, with no updates at this time.   Family History: History reviewed. No pertinent family history.  Social History:  Social History   Socioeconomic History  . Marital status: Single    Spouse name: None  . Number of children: None  . Years of education: None  . Highest education level: None  Social Needs  . Financial resource strain: None  . Food insecurity - worry: None  .  Food insecurity - inability: None  . Transportation needs - medical: None  . Transportation needs - non-medical: None  Occupational History  . None  Tobacco Use  . Smoking status: Never Smoker  . Smokeless tobacco: Never Used  Substance and Sexual Activity  . Alcohol use: No  . Drug use: No  . Sexual activity: No  Other Topics Concern  . None  Social History Narrative  . None    Allergies: No Known Allergies  Metabolic Disorder Labs: No results found for: HGBA1C, MPG No results found for: PROLACTIN No results found for: CHOL, TRIG, HDL, CHOLHDL, VLDL, LDLCALC No results found for: TSH  Therapeutic Level Labs: No results found for: LITHIUM No results found for: VALPROATE No components found for:  CBMZ  Current Medications: Current Outpatient Medications  Medication Sig Dispense Refill  . dapagliflozin propanediol (FARXIGA) 10 MG TABS tablet Take 10 mg by mouth.    . docusate sodium (COLACE) 100 MG capsule Take 100 mg by mouth.    . Exenatide ER (BYDUREON) 2 MG PEN Inject 2 mg into the skin once a week.    . Ferrous Sulfate (IRON) 325 (65 Fe) MG TABS Take 325 mg by mouth daily.  3  . gabapentin (NEURONTIN) 300 MG capsule Take 1 capsule by mouth 3 (three) times daily.  2  . metFORMIN (GLUCOPHAGE-XR) 500 MG 24 hr tablet Take  1,000 mg by mouth 2 (two) times daily.  3  . nortriptyline (PAMELOR) 75 MG capsule Take 1 capsule (75 mg total) at bedtime by mouth. 30 capsule 3   No current facility-administered medications for this visit.      Musculoskeletal: Strength & Muscle Tone: within normal limits Gait & Station: normal Patient leans: N/A  Psychiatric Specialty Exam: ROS  Blood pressure 118/74, pulse 87, height _0  (1.676 m), weight 210 lb 9.6 oz (95.5 kg).Body mass index is 33.99 kg/m.  General Appearance: Casual and Fairly Groomed  Eye Contact:  Fair  Speech:  Clear and Coherent  Volume:  Decreased  Mood:  Dysphoric  Affect:  Congruent and Flat  Thought  Process:  Goal Directed and Descriptions of Associations: Intact  Orientation:  Full (Time, Place, and Person)  Thought Content: Logical   Suicidal Thoughts:  No  Homicidal Thoughts:  No  Memory:  Immediate;   Fair  Judgement:  Fair  Insight:  Shallow  Psychomotor Activity:  Psychomotor Retardation  Concentration:  Concentration: Fair  Recall:  [employer] of Knowledge: Fair  Language: Good  Akathisia:  Negative  Handed:  Right  AIMS (if indicated): not done  Assets:  Communication Skills Desire for Improvement Financial Resources/Insurance Housing Social Support Transportation Vocational/Educational  ADL's:  Intact  Cognition: WNL  Sleep:  Fair   Screenings: AIMS     Admission (Discharged) from 11/07/2016 in [facility] Total Score  0    AUDIT     Admission (Discharged) from 11/07/2016 in [facility]  Alcohol Use Disorder Identification Test Final Score (AUDIT)  0    PHQ2-9     Office Visit from 04/02/2017 in [facility]  PHQ-2 Total Score  5  PHQ-9 Total Score  23       Assessment and Plan:  Renee Turner continues to present with depression, with a presentation more consistent with persistent depressive disorder.  We agreed to up titrate nortriptyline to 75 mg capsule nightly.  She has not had any significant side effects, and her mood symptoms do not appear to be any different from our last visit in October when we discontinued Effexor.  She was approximately 30-40% adherent to her regimen of Effexor and Lamictal.  She is approximately 80% adherence to nortriptyline, and reports that this does help her with sleep to some degree.  No acute safety issues, I continue to encourage her to participate in individual therapy, and we will follow-up in 3 months or sooner if needed.  1. Persistent depressive disorder   2. Severe episode of recurrent major depressive disorder, without psychotic  features ([place])     Status of current problems: unchanged  Labs Ordered: No orders of the defined types were placed in this encounter.   Labs Reviewed: n/a  Collateral Obtained/Records Reviewed: n/a  Plan:  Increase nortriptyline to 75 mg nightly Continue in individual therapy Return to clinic in 3 months   I spent 15 minutes with the patient in direct face-to-face clinical care.  Greater than 50% of this time was spent in counseling and coordination of care with the patient.    Aundra Dubin, MD 09/23/2017, 11:38 AM

## 2017-12-24 ENCOUNTER — Ambulatory Visit (INDEPENDENT_AMBULATORY_CARE_PROVIDER_SITE_OTHER): Payer: BLUE CROSS/BLUE SHIELD | Admitting: Psychiatry

## 2017-12-24 ENCOUNTER — Encounter (HOSPITAL_COMMUNITY): Payer: Self-pay | Admitting: Psychiatry

## 2017-12-24 VITALS — BP 122/70 | HR 86 | Ht 66.0 in | Wt 213.4 lb

## 2017-12-24 DIAGNOSIS — F5101 Primary insomnia: Secondary | ICD-10-CM | POA: Diagnosis not present

## 2017-12-24 DIAGNOSIS — Z79899 Other long term (current) drug therapy: Secondary | ICD-10-CM | POA: Diagnosis not present

## 2017-12-24 DIAGNOSIS — F341 Dysthymic disorder: Secondary | ICD-10-CM

## 2017-12-24 DIAGNOSIS — I471 Supraventricular tachycardia: Secondary | ICD-10-CM | POA: Diagnosis not present

## 2017-12-24 DIAGNOSIS — F332 Major depressive disorder, recurrent severe without psychotic features: Secondary | ICD-10-CM | POA: Diagnosis not present

## 2017-12-24 MED ORDER — TRAZODONE HCL 50 MG PO TABS: 50.0000 mg | ORAL_TABLET | Freq: Every day | ORAL | 1 refills | Status: DC

## 2017-12-24 MED ORDER — FLUOXETINE HCL 20 MG PO CAPS: 20.0000 mg | ORAL_CAPSULE | Freq: Every day | ORAL | 1 refills | Status: DC

## 2017-12-24 NOTE — Progress Notes (Signed)
[facility] MD/PA/NP OP Progress Note  12/24/2017 9:31 AM Renee Turner  MRN:  [id number]  Chief Complaint: Same, okay  HPI: Renee Turner presents today for medication management follow-up.  She recently was diagnosed with supraventricular tachycardia and was initiated on metoprolol.  I educated her that Pamelor would not be sensible for ongoing treatment given her recent diagnosis of cardiac arrhythmia.  She reports that the symptoms of arrhythmia are resolved.  We agreed to discontinue Pamelor given that she only takes this approximately 3 out of 7 days/week.  She continues to have significant difficulty with intermittent medication adherence.  I discussed initiation of Prozac with her given the long half-life.  I encouraged her to use this medication every single day and she continues to reflect on difficulties with remembering to take the medicine, and having the motivation to do so.  We discussed initiating trazodone for sleep given her ongoing difficulties with insomnia even when she does take Pamelor.  She denies any acute suicidal thoughts, continues to engage in individual therapy.  I spent time discussing her future goals with her, which she continues to have a very few of.  She has moved out of her old apartment and moved back in with her parents until she finds a new place to live.  Her lease came to an end, and she wanted to potentially find a new place to live and consider going back to school.  I applauded her thinking about going back to school and we agreed to follow-up in a few months to discuss this further.  Visit Diagnosis:    ICD-10-CM   1. Severe episode of recurrent major depressive disorder, without psychotic features ([name]) F33.2 FLUoxetine (PROZAC) 20 MG capsule  2. Persistent depressive disorder F34.1 FLUoxetine (PROZAC) 20 MG capsule  3. Primary insomnia F51.01 FLUoxetine (PROZAC) 20 MG capsule    Past Psychiatric History: See intake H&P for full details. Reviewed, with no updates at  this time.   Past Medical History:  Past Medical History:  Diagnosis Date  . Anxiety   . Depression   . Diabetes mellitus without complication ([place])    Type II  . Diabetes mellitus, type II ([place])    No past surgical history on file.  Family Psychiatric History: See intake H&P for full details. Reviewed, with no updates at this time.   Family History: No family history on file.  Social History:  Social History   Socioeconomic History  . Marital status: Single    Spouse name: Not on file  . Number of children: Not on file  . Years of education: Not on file  . Highest education level: Not on file  Social Needs  . Financial resource strain: Not on file  . Food insecurity - worry: Not on file  . Food insecurity - inability: Not on file  . Transportation needs - medical: Not on file  . Transportation needs - non-medical: Not on file  Occupational History  . Not on file  Tobacco Use  . Smoking status: Never Smoker  . Smokeless tobacco: Never Used  Substance and Sexual Activity  . Alcohol use: No  . Drug use: No  . Sexual activity: No  Other Topics Concern  . Not on file  Social History Narrative  . Not on file    Allergies: No Known Allergies  Metabolic Disorder Labs: No results found for: HGBA1C, MPG No results found for: PROLACTIN No results found for: CHOL, TRIG, HDL, CHOLHDL, VLDL, LDLCALC No results found for:  TSH  Therapeutic Level Labs: No results found for: LITHIUM No results found for: VALPROATE No components found for:  CBMZ  Current Medications: Current Outpatient Medications  Medication Sig Dispense Refill  . metoprolol succinate (TOPROL-XL) 25 MG 24 hr tablet Take by mouth.    . dapagliflozin propanediol (FARXIGA) 10 MG TABS tablet Take 10 mg by mouth.    . docusate sodium (COLACE) 100 MG capsule Take 100 mg by mouth.    . Exenatide ER (BYDUREON) 2 MG PEN Inject 2 mg into the skin once a week.    . Ferrous Sulfate (IRON) 325 (65 Fe) MG TABS  Take 325 mg by mouth daily.  3  . FLUoxetine (PROZAC) 20 MG capsule Take 1 capsule (20 mg total) by mouth daily. 90 capsule 1  . gabapentin (NEURONTIN) 300 MG capsule Take 1 capsule by mouth 3 (three) times daily.  2  . metFORMIN (GLUCOPHAGE-XR) 500 MG 24 hr tablet Take 1,000 mg by mouth 2 (two) times daily.  3  . traZODone (DESYREL) 50 MG tablet Take 1 tablet (50 mg total) by mouth at bedtime. Take 2 hours before bedtime 90 tablet 1   No current facility-administered medications for this visit.      Musculoskeletal: Strength & Muscle Tone: within normal limits Gait & Station: normal Patient leans: N/A  Psychiatric Specialty Exam: ROS  Blood pressure 122/70, pulse 86, height _0  (1.676 m), weight 213 lb 6.4 oz (96.8 kg).Body mass index is 34.44 kg/m.  General Appearance: Casual and Fairly Groomed  Eye Contact:  Fair  Speech:  Clear and Coherent and Normal Rate  Volume:  Normal  Mood:  Euthymic  Affect:  Constricted and Flat  Thought Process:  Goal Directed and Descriptions of Associations: Intact  Orientation:  Full (Time, Place, and Person)  Thought Content: Logical   Suicidal Thoughts:  No  Homicidal Thoughts:  No  Memory:  Immediate;   Fair  Judgement:  Fair  Insight:  Shallow  Psychomotor Activity:  Normal  Concentration:  Concentration: Fair  Recall:  [employer] of Knowledge: Fair  Language: Good  Akathisia:  Negative  Handed:  Right  AIMS (if indicated): not done  Assets:  Communication Skills Desire for Improvement Financial Resources/Insurance Housing Social Support Transportation Vocational/Educational  ADL's:  Intact  Cognition: WNL  Sleep:  Fair   Screenings: AIMS     Admission (Discharged) from 11/07/2016 in [facility] Total Score  0    AUDIT     Admission (Discharged) from 11/07/2016 in [facility]  Alcohol Use Disorder Identification Test Final Score (AUDIT)  0    PHQ2-9     Office Visit  from 04/02/2017 in [facility]  PHQ-2 Total Score  5  PHQ-9 Total Score  23       Assessment and Plan:  Trystan Mathes presents with ongoing difficulties with medication adherence.  She has been intermittently taking Pamelor 75 mg nightly.  We agreed to discontinue the medication given her issues with adherence, and recent diagnosis of supraventricular tachycardia for which she is being treated with metoprolol.  Frankly she does not appear to be any less dysphoric and flat with nortriptyline, I suspect which is largely caused by her difficulty with adherence.  We agreed to initiate fluoxetine 20 mg daily and reviewed the risks and benefits, and we also agreed to initiate trazodone 50 mg nightly for sleep.  She continues in individual therapy.  Spent time  with her discussing some of her future goals, which remain fairly limited at this time.  She does share that she is considering restarting school in the summer.  1. Severe episode of recurrent major depressive disorder, without psychotic features ([place])   2. Persistent depressive disorder   3. Primary insomnia     Status of current problems: unchanged  Labs Ordered: No orders of the defined types were placed in this encounter.   Labs Reviewed: n/a  Collateral Obtained/Records Reviewed: n/a  Plan:  Continue individual therapy Prozac 20 mg daily Trazodone 50 mg nightly Discontinue Pamelor Return to clinic in 3-4 months  I spent 20 minutes with the patient in direct face-to-face clinical care.  Greater than 50% of this time was spent in counseling and coordination of care with the patient.    Aundra Dubin, MD 12/24/2017, 9:31 AM

## 2018-02-19 ENCOUNTER — Ambulatory Visit (INDEPENDENT_AMBULATORY_CARE_PROVIDER_SITE_OTHER): Payer: BLUE CROSS/BLUE SHIELD | Admitting: Psychiatry

## 2018-02-19 ENCOUNTER — Encounter (HOSPITAL_COMMUNITY): Payer: Self-pay | Admitting: Psychiatry

## 2018-02-19 DIAGNOSIS — Z62810 Personal history of physical and sexual abuse in childhood: Secondary | ICD-10-CM

## 2018-02-19 DIAGNOSIS — F332 Major depressive disorder, recurrent severe without psychotic features: Secondary | ICD-10-CM | POA: Diagnosis not present

## 2018-02-19 DIAGNOSIS — F341 Dysthymic disorder: Secondary | ICD-10-CM

## 2018-02-19 DIAGNOSIS — F5101 Primary insomnia: Secondary | ICD-10-CM | POA: Diagnosis not present

## 2018-02-19 MED ORDER — TRAZODONE HCL 50 MG PO TABS: 50.0000 mg | ORAL_TABLET | Freq: Every day | ORAL | 1 refills | Status: DC

## 2018-02-19 MED ORDER — FLUOXETINE HCL 40 MG PO CAPS: 40.0000 mg | ORAL_CAPSULE | Freq: Every day | ORAL | 0 refills | Status: DC
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
## 2018-04-03 ENCOUNTER — Ambulatory Visit (HOSPITAL_COMMUNITY): Payer: BLUE CROSS/BLUE SHIELD | Admitting: Psychiatry

## 2018-04-23 ENCOUNTER — Ambulatory Visit (HOSPITAL_COMMUNITY): Payer: Self-pay | Admitting: Psychiatry

## 2018-05-05 ENCOUNTER — Encounter (HOSPITAL_COMMUNITY): Payer: Self-pay | Admitting: Psychiatry

## 2018-05-05 ENCOUNTER — Ambulatory Visit (INDEPENDENT_AMBULATORY_CARE_PROVIDER_SITE_OTHER): Payer: BLUE CROSS/BLUE SHIELD | Admitting: Psychiatry

## 2018-05-05 VITALS — BP 116/79 | HR 81 | Ht 66.0 in | Wt 202.0 lb

## 2018-05-05 DIAGNOSIS — F332 Major depressive disorder, recurrent severe without psychotic features: Secondary | ICD-10-CM

## 2018-05-05 NOTE — Progress Notes (Signed)
BH MD/PA/NP OP Progress Note  05/05/2018 2:43 PM Renee Turner  MRN:  [id number]  Chief Complaint: pretty depressed  HPI: Renee Turner presents with ongoing depression, admits that she only takes her Prozac about half the time or less.  I spent time with her reviewing the myriad of past medication trials we have attempted, but adherence being a significant factor for ongoing depression.  She denies any intentions to harm herself.  She has not been doing well and has moved back home with her parents.  She is still working at the nursing home.  I strongly suggested that she meet with our ECT consultants once again who have recommended ECT for her in the past and she was receptive to this.  Visit Diagnosis:    ICD-10-CM   1. Severe episode of recurrent major depressive disorder, without psychotic features ([place]) F33.2 Ambulatory referral to Psychiatry    Past Psychiatric History: See intake H&P for full details. Reviewed, with no updates at this time.   Past Medical History:  Past Medical History:  Diagnosis Date  . Anxiety   . Depression   . Diabetes mellitus without complication ([place])    Type II  . Diabetes mellitus, type II ([place])    No past surgical history on file.  Family Psychiatric History: See intake H&P for full details. Reviewed, with no updates at this time.   Family History: No family history on file.  Social History:  Social History   Socioeconomic History  . Marital status: Single    Spouse name: Not on file  . Number of children: Not on file  . Years of education: Not on file  . Highest education level: Not on file  Occupational History  . Not on file  Social Needs  . Financial resource strain: Not on file  . Food insecurity:    Worry: Not on file    Inability: Not on file  . Transportation needs:    Medical: Not on file    Non-medical: Not on file  Tobacco Use  . Smoking status: Never Smoker  . Smokeless tobacco: Never Used  Substance and Sexual Activity  .  Alcohol use: No  . Drug use: No  . Sexual activity: Never  Lifestyle  . Physical activity:    Days per week: Not on file    Minutes per session: Not on file  . Stress: Not on file  Relationships  . Social connections:    Talks on phone: Not on file    Gets together: Not on file    Attends religious service: Not on file    Active member of club or organization: Not on file    Attends meetings of clubs or organizations: Not on file    Relationship status: Not on file  Other Topics Concern  . Not on file  Social History Narrative  . Not on file    Allergies: No Known Allergies  Metabolic Disorder Labs: No results found for: HGBA1C, MPG No results found for: PROLACTIN No results found for: CHOL, TRIG, HDL, CHOLHDL, VLDL, LDLCALC No results found for: TSH  Therapeutic Level Labs: No results found for: LITHIUM No results found for: VALPROATE No components found for:  CBMZ  Current Medications: Current Outpatient Medications  Medication Sig Dispense Refill  . dapagliflozin propanediol (FARXIGA) 10 MG TABS tablet Take 10 mg by mouth.    . docusate sodium (COLACE) 100 MG capsule Take 100 mg by mouth.    . Exenatide ER (BYDUREON) 2  MG PEN Inject 2 mg into the skin once a week.    . Ferrous Sulfate (IRON) 325 (65 Fe) MG TABS Take 325 mg by mouth daily.  3  . FLUoxetine (PROZAC) 40 MG capsule Take 1 capsule (40 mg total) by mouth daily. 90 capsule 0  . gabapentin (NEURONTIN) 300 MG capsule Take 1 capsule by mouth 3 (three) times daily.  2  . metFORMIN (GLUCOPHAGE-XR) 500 MG 24 hr tablet Take 1,000 mg by mouth 2 (two) times daily.  3  . metoprolol succinate (TOPROL-XL) 25 MG 24 hr tablet Take by mouth.    . traZODone (DESYREL) 50 MG tablet Take 1 tablet (50 mg total) by mouth at bedtime. Take 2 hours before bedtime 90 tablet 1   No current facility-administered medications for this visit.     Musculoskeletal: Strength & Muscle Tone: within normal limits Gait & Station:  normal Patient leans: N/A  Psychiatric Specialty Exam: ROS  Blood pressure 116/79, pulse 81, height _0  (1.676 m), weight 202 lb (91.6 kg).Body mass index is 32.6 kg/m.  General Appearance: Casual and Fairly Groomed  Eye Contact:  Fair  Speech:  Clear and Coherent and Normal Rate  Volume:  Normal  Mood:  Depressed and Dysphoric  Affect:  Depressed and Flat  Thought Process:  Goal Directed and Descriptions of Associations: Intact  Orientation:  Full (Time, Place, and Person)  Thought Content: Logical   Suicidal Thoughts:  No  Homicidal Thoughts:  No  Memory:  Immediate;   Fair  Judgement:  Fair  Insight:  Fair  Psychomotor Activity:  Normal, Psychomotor Retardation and baseline  Concentration:  Concentration: Fair  Recall:  [employer] of Knowledge: Fair  Language: Fair  Akathisia:  Negative  Handed:  Right  AIMS (if indicated): not done  Assets:  Communication Skills Desire for Improvement Financial Resources/Insurance Housing Transportation Vocational/Educational  ADL's:  Intact  Cognition: WNL  Sleep:  Poor   Screenings: AIMS     Admission (Discharged) from 11/07/2016 in [place] Total Score  0    AUDIT     Admission (Discharged) from 11/07/2016 in [place]  Alcohol Use Disorder Identification Test Final Score (AUDIT)  0    PHQ2-9     Office Visit from 04/02/2017 in [facility]  PHQ-2 Total Score  5  PHQ-9 Total Score  23       Assessment and Plan:  Renee Turner presents with ongoing severe melancholic depression.  She takes her medications about half the time or less.  We have attempted [place] with her in the past which seemed to provide some relief.  I believe she continues to be an appropriate candidate for ECT and she is now willing to proceed with that treatment.  She denies any acute suicidal thoughts, but remains high risk for self-harm given the severity of her  depression and comorbid medical issues.  1. Severe episode of recurrent major depressive disorder, without psychotic features ([place])     Status of current problems: gradually worsening  Labs Ordered: Orders Placed This Encounter  Procedures  . Ambulatory referral to Psychiatry    Referral Priority:   Routine    Referral Type:   Psychiatric    Referral Reason:   Specialty Services Required    Referred to Provider:   Gonzella Lex, MD    Requested Specialty:   Psychiatry    Number of Visits Requested:   1  Labs Reviewed: n/a  Collateral Obtained/Records Reviewed: n/a  Plan:  Increase Prozac to 40 mg daily Trazodone 50 mg nightly prn ECT consult Return to clinic in 4-6 weeks  I spent 25 minutes with the patient in direct face-to-face clinical care.  Greater than 50% of this time was spent in counseling and coordination of care with the patient.    Aundra Dubin, MD 05/05/2018, 2:43 PM

## 2018-05-06 ENCOUNTER — Encounter (HOSPITAL_COMMUNITY): Payer: Self-pay | Admitting: *Deleted

## 2018-05-06 ENCOUNTER — Other Ambulatory Visit: Payer: Self-pay

## 2018-05-06 ENCOUNTER — Inpatient Hospital Stay (HOSPITAL_COMMUNITY)
Admission: RE | Admit: 2018-05-06 | Discharge: 2018-05-14 | DRG: 885 | Disposition: A | Discharge status: Other Acute Inpatient Hospital | Payer: BLUE CROSS/BLUE SHIELD | Attending: Psychiatry | Admitting: Psychiatry

## 2018-05-06 DIAGNOSIS — F332 Major depressive disorder, recurrent severe without psychotic features: Secondary | ICD-10-CM | POA: Diagnosis not present

## 2018-05-06 DIAGNOSIS — Z91410 Personal history of adult physical and sexual abuse: Secondary | ICD-10-CM | POA: Diagnosis not present

## 2018-05-06 DIAGNOSIS — Z79899 Other long term (current) drug therapy: Secondary | ICD-10-CM

## 2018-05-06 DIAGNOSIS — E1140 Type 2 diabetes mellitus with diabetic neuropathy, unspecified: Secondary | ICD-10-CM | POA: Diagnosis present

## 2018-05-06 DIAGNOSIS — R45851 Suicidal ideations: Secondary | ICD-10-CM | POA: Diagnosis present

## 2018-05-06 DIAGNOSIS — F419 Anxiety disorder, unspecified: Secondary | ICD-10-CM | POA: Diagnosis present

## 2018-05-06 DIAGNOSIS — F4310 Post-traumatic stress disorder, unspecified: Secondary | ICD-10-CM | POA: Diagnosis present

## 2018-05-06 DIAGNOSIS — G4700 Insomnia, unspecified: Secondary | ICD-10-CM | POA: Diagnosis present

## 2018-05-06 DIAGNOSIS — F341 Dysthymic disorder: Secondary | ICD-10-CM | POA: Diagnosis present

## 2018-05-06 DIAGNOSIS — Z7984 Long term (current) use of oral hypoglycemic drugs: Secondary | ICD-10-CM | POA: Diagnosis not present

## 2018-05-06 DIAGNOSIS — F5101 Primary insomnia: Secondary | ICD-10-CM

## 2018-05-06 DIAGNOSIS — R000 Tachycardia, unspecified: Secondary | ICD-10-CM | POA: Diagnosis not present

## 2018-05-06 MED ORDER — FLUOXETINE HCL 20 MG PO CAPS
20.0000 mg | ORAL_CAPSULE | Freq: Every day | ORAL | Status: DC
  Administered 2018-05-06 – 2018-05-08 (×3): 20 mg via ORAL
  Filled 2018-05-06 (×6): qty 1

## 2018-05-06 MED ORDER — MAGNESIUM HYDROXIDE 400 MG/5ML PO SUSP: 30.0000 mL | Freq: Every day | ORAL | Status: DC | PRN

## 2018-05-06 MED ORDER — GABAPENTIN 300 MG PO CAPS
300.0000 mg | ORAL_CAPSULE | Freq: Three times a day (TID) | ORAL | Status: DC
  Administered 2018-05-06 – 2018-05-14 (×24): 300 mg via ORAL
  Filled 2018-05-06 (×30): qty 1

## 2018-05-06 MED ORDER — TRAZODONE HCL 50 MG PO TABS
50.0000 mg | ORAL_TABLET | Freq: Every evening | ORAL | Status: DC | PRN
Start: 2018-05-06 — End: 2018-05-14
  Administered 2018-05-06 – 2018-05-13 (×7): 50 mg via ORAL
  Filled 2018-05-06 (×8): qty 1

## 2018-05-06 MED ORDER — EXENATIDE 5 MCG/0.02ML ~~LOC~~ SOPN: 5.0000 ug | PEN_INJECTOR | Freq: Two times a day (BID) | SUBCUTANEOUS | Status: DC

## 2018-05-06 MED ORDER — METFORMIN HCL ER 500 MG PO TB24
1000.0000 mg | ORAL_TABLET | Freq: Two times a day (BID) | ORAL | Status: DC
  Administered 2018-05-07 – 2018-05-14 (×15): 1000 mg via ORAL
  Filled 2018-05-06 (×22): qty 2

## 2018-05-06 MED ORDER — HYDROXYZINE HCL 25 MG PO TABS
25.0000 mg | ORAL_TABLET | Freq: Three times a day (TID) | ORAL | Status: DC | PRN
  Administered 2018-05-06 – 2018-05-14 (×4): 25 mg via ORAL
  Filled 2018-05-06 (×5): qty 1

## 2018-05-06 MED ORDER — FERROUS SULFATE 325 (65 FE) MG PO TABS
325.0000 mg | ORAL_TABLET | Freq: Every day | ORAL | Status: DC
  Administered 2018-05-07 – 2018-05-14 (×8): 325 mg via ORAL
  Filled 2018-05-06 (×10): qty 1

## 2018-05-06 MED ORDER — METOPROLOL SUCCINATE ER 25 MG PO TB24
25.0000 mg | ORAL_TABLET | Freq: Every day | ORAL | Status: DC
  Filled 2018-05-06 (×3): qty 1

## 2018-05-06 MED ORDER — ACETAMINOPHEN 325 MG PO TABS
650.0000 mg | ORAL_TABLET | Freq: Four times a day (QID) | ORAL | Status: DC | PRN
  Administered 2018-05-06 – 2018-05-09 (×2): 650 mg via ORAL
  Filled 2018-05-06 (×2): qty 2

## 2018-05-06 MED ORDER — ALUM & MAG HYDROXIDE-SIMETH 200-200-20 MG/5ML PO SUSP: 30.0000 mL | ORAL | Status: DC | PRN
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
## 2018-05-06 NOTE — Progress Notes (Signed)
Pt was observed resting in room with eyes closed. Pt was awaken for assessment. Pt appears flat in affect and mood. Pt denies SI/HI/AVH at this time. Rates pain 8/10; lower back. Pt states she is hoping to speak with provider tomorrow about possibility of trying ECT. Pt is minimal and guarded with interaction. Fluids and snacks offered; fluids accepted. PRN tylenol, trazodone, and vistaril requested and given. Encouragement and support provided. Will continue with POC.

## 2018-05-06 NOTE — Progress Notes (Signed)
Renee Turner is a 22 year old female pt admitted on voluntary basis after presenting as a walk-in. On admission she endorses depression and suicidal thoughts and feelings but is able to contract for safety while on the unit. She spoke about how she is prescribed medications but does not really take them like she should. She spoke about how she would like to get into ECT treatment. She denies any substance abuse issues. She reports that she lives with her parents and reports that she will go back there once she is discharged. Renee Turner was oriented to the unit and safety maintained.
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
## 2018-05-06 NOTE — Tx Team (Signed)
Initial Treatment Plan 05/06/2018 5:01 PM Renee Turner VXB:[id number]    PATIENT STRESSORS: Medication change or noncompliance Traumatic event   PATIENT STRENGTHS: Ability for insight Average or above average intelligence Capable of independent living General fund of knowledge Supportive family/friends   PATIENT IDENTIFIED PROBLEMS: Depression Suicidal thoughts "coping skills and get ECT"                     DISCHARGE CRITERIA:  Ability to meet basic life and health needs Improved stabilization in mood, thinking, and/or behavior Reduction of life-threatening or endangering symptoms to within safe limits Verbal commitment to aftercare and medication compliance  PRELIMINARY DISCHARGE PLAN: Attend aftercare/continuing care group Return to previous living arrangement  PATIENT/FAMILY INVOLVEMENT: This treatment plan has been presented to and reviewed with the patient, Renee Turner, and/or family member, .  The patient and family have been given the opportunity to ask questions and make suggestions.  Jace Fermin, [place], [place] 05/06/2018, 5:01 PM

## 2018-05-06 NOTE — H&P (Signed)
Behavioral Health Medical Screening Exam  Renee Turner is an 22 y.o. female presents with worsening depression and suicidal ideations.  Patient presents as a walking accompanied by her coworkers who expressed concerns about suicidal ideations and a suicide letter that was found.  Patient was recently seen by Dr. Daron Offer yesterday who strongly suggest ECT due to previous treatment failures of medication trials and continuous depressive symptoms and dysthymia.  Upon evaluation patient with poor eye contact, depressed mood, and affect is congruent and restricted.  Patient is guarded and speaks very minimally. She is minimizing her depressive symptoms at this time and does not appear to be forthcoming at this time. She admits to typing the letter at this time however does not wish to explain what she meant by this. At this time she reports suicidal ideation, and open to admission.   Total Time spent with patient: 30 minutes  Psychiatric Specialty Exam: Physical Exam  ROS  There were no vitals taken for this visit.There is no height or weight on file to calculate BMI.  General Appearance: Fairly Groomed and Guarded  Eye Contact:  Poor  Speech:  Clear and Coherent and Normal Rate  Volume:  Normal  Mood:  Depressed  Affect:  Depressed, Flat and Restricted  Thought Process:  Linear and Descriptions of Associations: Intact  Orientation:  Full (Time, Place, and Person)  Thought Content:  Logical  Suicidal Thoughts:  Yes.  with intent/plan  Homicidal Thoughts:  No  Memory:  Immediate;   Fair Recent;   Fair  Judgement:  Poor  Insight:  Fair  Psychomotor Activity:  Psychomotor Retardation  Concentration: Concentration: Fair and Attention Span: Fair  Recall:  [employer] of Knowledge:Fair  Language: Fair  Akathisia:  No  Handed:  Right  AIMS (if indicated):     Assets:  Communication Skills Desire for Improvement Financial Resources/Insurance Leisure Time Physical Health Social  Support Vocational/Educational  Sleep:       Musculoskeletal: Strength & Muscle Tone: within normal limits Gait & Station: normal Patient leans: N/A  There were no vitals taken for this visit.  Recommendations:  Based on my evaluation the patient does not appear to have an emergency medical condition.  Will admit inpatient at this time and continue referral to ECT for dysthymia and suicidal ideations.  Nanci Pina, FNP 05/06/2018, 1:24 PM

## 2018-05-06 NOTE — Progress Notes (Signed)
Urine cup given. Pt aware. Pending sample.  

## 2018-05-07 LAB — COMPREHENSIVE METABOLIC PANEL
ALBUMIN: 3.8 g/dL (ref 3.5–5.0)
ALK PHOS: 49 U/L (ref 38–126)
ALT: 11 U/L (ref 0–44)
ANION GAP: 7 (ref 5–15)
AST: 14 U/L — AB (ref 15–41)
BUN: 10 mg/dL (ref 6–20)
CO2: 28 mmol/L (ref 22–32)
Calcium: 9.3 mg/dL (ref 8.9–10.3)
Chloride: 103 mmol/L (ref 98–111)
Creatinine, Ser: 0.68 mg/dL (ref 0.44–1.00)
GFR calc Af Amer: >60 mL/min (ref >60)
GFR calc non Af Amer: >60 mL/min (ref >60)
GLUCOSE: 118 mg/dL — AB (ref 70–99)
Potassium: 4 mmol/L (ref 3.5–5.1)
SODIUM: 138 mmol/L (ref 135–145)
Total Bilirubin: 0.6 mg/dL (ref 0.3–1.2)
Total Protein: 8.1 g/dL (ref 6.5–8.1)

## 2018-05-07 LAB — LIPID PANEL
CHOLESTEROL: 181 mg/dL (ref 0–200)
HDL: 46 mg/dL (ref >40)
LDL Cholesterol: 117 mg/dL — ABNORMAL HIGH (ref 0–99)
TRIGLYCERIDES: 92 mg/dL (ref <150)
Total CHOL/HDL Ratio: 3.9 RATIO
VLDL: 18 mg/dL (ref 0–40)

## 2018-05-07 LAB — CBC
HCT: 37.5 % (ref 36.0–46.0)
HEMOGLOBIN: 12 g/dL (ref 12.0–15.0)
MCH: 26.6 pg (ref 26.0–34.0)
MCHC: 32 g/dL (ref 30.0–36.0)
MCV: 83.1 fL (ref 78.0–100.0)
Platelets: 371 10*3/uL (ref 150–400)
RBC: 4.51 MIL/uL (ref 3.87–5.11)
RDW: 14.9 % (ref 11.5–15.5)
WBC: 10.7 10*3/uL — ABNORMAL HIGH (ref 4.0–10.5)

## 2018-05-07 LAB — HEMOGLOBIN A1C
HEMOGLOBIN A1C: 6.5 % — AB (ref 4.8–5.6)
MEAN PLASMA GLUCOSE: 139.85 mg/dL

## 2018-05-07 LAB — GLUCOSE, CAPILLARY
GLUCOSE-CAPILLARY: 120 mg/dL — AB (ref 70–99)
Glucose-Capillary: 108 mg/dL — ABNORMAL HIGH (ref 70–99)
Glucose-Capillary: 144 mg/dL — ABNORMAL HIGH (ref 70–99)

## 2018-05-07 LAB — TSH: TSH: 3.019 u[IU]/mL (ref 0.350–4.500)

## 2018-05-07 MED ORDER — PROTRIPTYLINE HCL 5 MG PO TABS
5.0000 mg | ORAL_TABLET | Freq: Three times a day (TID) | ORAL | Status: DC
  Filled 2018-05-07: qty 1

## 2018-05-07 MED ORDER — PROTRIPTYLINE HCL 5 MG PO TABS: 5.0000 mg | ORAL_TABLET | Freq: Three times a day (TID) | ORAL | Status: DC

## 2018-05-07 MED ORDER — PROTRIPTYLINE HCL 5 MG PO TABS
5.0000 mg | ORAL_TABLET | Freq: Three times a day (TID) | ORAL | Status: DC
  Administered 2018-05-09 – 2018-05-10 (×3): 5 mg via ORAL
  Filled 2018-05-07 (×6): qty 1
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
## 2018-05-07 NOTE — Plan of Care (Signed)
  Problem: Education: Goal: Emotional status will improve Outcome: Not Progressing   Problem: Activity: Goal: Interest or engagement in activities will improve Outcome: Not Progressing   Problem: Safety: Goal: Periods of time without injury will increase Outcome: Progressing  DAR NOTE: Patient presents with flat affect and depressed mood.  Denies suicidal thoughts, auditory and visual hallucinations.  Rates depression at 5, hopelessness at 3, and anxiety at 4.  Maintained on routine safety checks.  Medications given as prescribed.  Support and encouragement offered as needed.  Attended group and participated.  States goal for today is to be transferred for ECT."  Patient is withdrawn and isolates to her room for majority of this shift.  Offered no complaint.

## 2018-05-07 NOTE — Tx Team (Signed)
Interdisciplinary Treatment and Diagnostic Plan Update  05/07/2018 Time of Session: 9:30am Renee Turner MRN: [id number]  Principal Diagnosis: <principal problem not specified>  Secondary Diagnoses: Active Problems:   MDD (major depressive disorder), recurrent severe, without psychosis ([place])   Current Medications:  Current Facility-Administered Medications  Medication Dose Route Frequency Provider Last Rate Last Dose  . acetaminophen (TYLENOL) tablet 650 mg  650 mg Oral Q6H PRN Nanci Pina, FNP   650 mg at 05/06/18 2243  . alum & mag hydroxide-simeth (MAALOX/MYLANTA) 200-200-20 MG/5ML suspension 30 mL  30 mL Oral Q4H PRN Starkes, Takia S, FNP      . exenatide (BYETTA) immediate release injection  5 mcg Subcutaneous BID WC Sharma Covert, MD      . ferrous sulfate tablet 325 mg  325 mg Oral Q breakfast Sharma Covert, MD   325 mg at 05/07/18 0831  . FLUoxetine (PROZAC) capsule 20 mg  20 mg Oral Daily Sharma Covert, MD   20 mg at 05/07/18 0831  . gabapentin (NEURONTIN) capsule 300 mg  300 mg Oral TID Sharma Covert, MD   300 mg at 05/07/18 0831  . hydrOXYzine (ATARAX/VISTARIL) tablet 25 mg  25 mg Oral TID PRN Nanci Pina, FNP   25 mg at 05/06/18 2243  . magnesium hydroxide (MILK OF MAGNESIA) suspension 30 mL  30 mL Oral Daily PRN Lavina Hamman, Takia S, FNP      . metFORMIN (GLUCOPHAGE-XR) 24 hr tablet 1,000 mg  1,000 mg Oral BID WC Sharma Covert, MD   1,000 mg at 05/07/18 0831  . metoprolol succinate (TOPROL-XL) 24 hr tablet 25 mg  25 mg Oral Daily Sharma Covert, MD      . traZODone (DESYREL) tablet 50 mg  50 mg Oral QHS PRN Sharma Covert, MD   50 mg at 05/06/18 2243   PTA Medications: Medications Prior to Admission  Medication Sig Dispense Refill Last Dose  . dapagliflozin propanediol (FARXIGA) 10 MG TABS tablet Take 10 mg by mouth.   Not Taking at Unknown time  . docusate sodium (COLACE) 100 MG capsule Take 100 mg by mouth.   Not Taking at Unknown time   . Exenatide ER (BYDUREON) 2 MG PEN Inject 2 mg into the skin once a week.   Not Taking at Unknown time  . Ferrous Sulfate (IRON) 325 (65 Fe) MG TABS Take 325 mg by mouth daily.  3 Not Taking at Unknown time  . FLUoxetine (PROZAC) 40 MG capsule Take 1 capsule (40 mg total) by mouth daily. (Patient not taking: Reported on 05/06/2018) 90 capsule 0 Not Taking at Unknown time  . gabapentin (NEURONTIN) 300 MG capsule Take 1 capsule by mouth 3 (three) times daily.  2 Not Taking at Unknown time  . metFORMIN (GLUCOPHAGE-XR) 500 MG 24 hr tablet Take 1,000 mg by mouth 2 (two) times daily.  3 Not Taking at Unknown time  . metoprolol succinate (TOPROL-XL) 25 MG 24 hr tablet Take by mouth.   Not Taking at Unknown time  . traZODone (DESYREL) 50 MG tablet Take 1 tablet (50 mg total) by mouth at bedtime. Take 2 hours before bedtime (Patient not taking: Reported on 05/06/2018) 90 tablet 1 Not Taking at Unknown time    Patient Stressors: Medication change or noncompliance Traumatic event  Patient Strengths: Ability for insight Average or above average intelligence Capable of independent living General fund of knowledge Supportive family/friends  Treatment Modalities: Medication Management, Group therapy, Case management,  1  to 1 session with clinician, Psychoeducation, Recreational therapy.   Physician Treatment Plan for Primary Diagnosis: <principal problem not specified> Long Term Goal(s): Improvement in symptoms so as ready for discharge Improvement in symptoms so as ready for discharge   Short Term Goals: Ability to identify changes in lifestyle to reduce recurrence of condition will improve Ability to verbalize feelings will improve Ability to disclose and discuss suicidal ideas Ability to demonstrate self-control will improve Ability to identify and develop effective coping behaviors will improve Ability to maintain clinical measurements within normal limits will improve Compliance with prescribed  medications will improve Ability to identify changes in lifestyle to reduce recurrence of condition will improve Ability to verbalize feelings will improve Ability to disclose and discuss suicidal ideas Ability to demonstrate self-control will improve Ability to identify and develop effective coping behaviors will improve Ability to maintain clinical measurements within normal limits will improve Compliance with prescribed medications will improve  Medication Management: Evaluate patient's response, side effects, and tolerance of medication regimen.  Therapeutic Interventions: 1 to 1 sessions, Unit Group sessions and Medication administration.  Evaluation of Outcomes: Not Met  Physician Treatment Plan for Secondary Diagnosis: Active Problems:   MDD (major depressive disorder), recurrent severe, without psychosis ([place])  Long Term Goal(s): Improvement in symptoms so as ready for discharge Improvement in symptoms so as ready for discharge   Short Term Goals: Ability to identify changes in lifestyle to reduce recurrence of condition will improve Ability to verbalize feelings will improve Ability to disclose and discuss suicidal ideas Ability to demonstrate self-control will improve Ability to identify and develop effective coping behaviors will improve Ability to maintain clinical measurements within normal limits will improve Compliance with prescribed medications will improve Ability to identify changes in lifestyle to reduce recurrence of condition will improve Ability to verbalize feelings will improve Ability to disclose and discuss suicidal ideas Ability to demonstrate self-control will improve Ability to identify and develop effective coping behaviors will improve Ability to maintain clinical measurements within normal limits will improve Compliance with prescribed medications will improve     Medication Management: Evaluate patient's response, side effects, and tolerance of  medication regimen.  Therapeutic Interventions: 1 to 1 sessions, Unit Group sessions and Medication administration.  Evaluation of Outcomes: Not Met   RN Treatment Plan for Primary Diagnosis: <principal problem not specified> Long Term Goal(s): Knowledge of disease and therapeutic regimen to maintain health will improve  Short Term Goals: Ability to disclose and discuss suicidal ideas, Ability to identify and develop effective coping behaviors will improve and Compliance with prescribed medications will improve  Medication Management: RN will administer medications as ordered by provider, will assess and evaluate patient's response and provide education to patient for prescribed medication. RN will report any adverse and/or side effects to prescribing provider.  Therapeutic Interventions: 1 on 1 counseling sessions, Psychoeducation, Medication administration, Evaluate responses to treatment, Monitor vital signs and CBGs as ordered, Perform/monitor CIWA, COWS, AIMS and Fall Risk screenings as ordered, Perform wound care treatments as ordered.  Evaluation of Outcomes: Not Met   LCSW Treatment Plan for Primary Diagnosis: <principal problem not specified> Long Term Goal(s): Safe transition to appropriate next level of care at discharge, Engage patient in therapeutic group addressing interpersonal concerns.  Short Term Goals: Engage patient in aftercare planning with referrals and resources  Therapeutic Interventions: Assess for all discharge needs, 1 to 1 time with Social worker, Explore available resources and support systems, Assess for adequacy in community support network, Educate family  and significant other(s) on suicide prevention, Complete Psychosocial Assessment, Interpersonal group therapy.  Evaluation of Outcomes: Not Met   Progress in Treatment: Attending groups: No. Participating in groups: No. Taking medication as prescribed: Yes. Toleration medication:  Yes. Family/Significant other contact made: No, will contact:  if patient consents Patient understands diagnosis: Yes. Discussing patient identified problems/goals with staff: Yes. Medical problems stabilized or resolved: Yes. Denies suicidal/homicidal ideation: Yes. Issues/concerns per patient self-inventory: No. Other:   New problem(s) identified: None   New Short Term/Long Term Goal(s):medication stabilization, elimination of SI thoughts, development of comprehensive mental wellness plan.    Patient Goals:  "coping skills and get ECT"  Discharge Plan or Barriers: CSW will assess for appropriate referrals  Reason for Continuation of Hospitalization: Depression Medication stabilization Suicidal ideation  Estimated Length of Stay: 3-5 days   Attendees: Patient: Renee Turner  05/07/2018 8:41 AM  Physician: Dr. Myles Lipps, MD 05/07/2018 8:41 AM  Nursing: Benjamine Mola, [place] 05/07/2018 8:41 AM  RN Care Manager: Rhunette Croft 05/07/2018 8:41 AM  Social Worker: Radonna Ricker, [place] 05/07/2018 8:41 AM  Recreational Therapist: Rhunette Croft 05/07/2018 8:41 AM  Other: X 05/07/2018 8:41 AM  Other: X 05/07/2018 8:41 AM  Other:X 05/07/2018 8:41 AM    Scribe for Treatment Team: Marylee Floras, [place] 05/07/2018 8:41 AM

## 2018-05-07 NOTE — BHH Suicide Risk Assessment (Signed)
[facility] INPATIENT:  Family/Significant Other Suicide Prevention Education  Suicide Prevention Education:  Patient Refusal for Family/Significant Other Suicide Prevention Education: The patient Renee Turner has refused to provide written consent for family/significant other to be provided Family/Significant Other Suicide Prevention Education during admission and/or prior to discharge.  Physician notified.  SPE completed with patient, as patient refused to consent to family contact. SPI pamphlet provided to pt and pt was encouraged to share information with support network, ask questions, and talk about any concerns relating to SPE. Patient denies access to guns/firearms and verbalized understanding of information provided. Mobile Crisis information also provided to patient.    Marylee Floras 05/07/2018, 3:24 PM

## 2018-05-07 NOTE — Progress Notes (Signed)
[facility] MD Progress Note  05/07/2018 12:12 PM Renee Turner  MRN:  [id number] Subjective: Patient is seen and examined.  Patient is a 22 year old female with past psychiatric history significant for major depression as well as posttraumatic stress disorder who seen in follow-up.  She is essentially unchanged.  She still remains significantly depressed.  Her ECT consult is pending.  She stated that she had not been taking the Prozac 40 mg p.o. daily for approximately 3 weeks.  I restarted her fluoxetine yesterday 20 mg a day.  We discussed other medication she had been on in the past.  She has not been treated with Trintellix, but I am concerned about the cost she would have as an outpatient.  She had been previously treated with stimulants for attentional problems, but she does not recall whether or not that was beneficial with regard to any of her mood symptoms.  She also denied ever having taken protriptyline in the past.  We discussed that.  She stated that she had not had any suicidal ideation this morning.  She did state that the Prozac did help decrease some of her suicidal thoughts.  Her consult with Dr. Weber Cooks is still pending. Principal Problem: <principal problem not specified> Diagnosis:   Patient Active Problem List   Diagnosis Date Noted  . MDD (major depressive disorder), recurrent severe, without psychosis ([place]) [F33.2] 05/06/2018  . 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 Posttraumatic stress disorder [F43.10] 11/13/2016  . Severe episode of recurrent major depressive disorder, without psychotic features ([place]) [F33.2] 11/07/2016  . Diabetes ([place]) [E11.9] 11/07/2016  . Diabetic neuropathy associated with type 2 diabetes mellitus ([place]) [E11.40] 07/19/2016  . Obesity (BMI 30.0-34.9) [E66.9] 07/19/2016  . Type 2 diabetes mellitus with hyperglycemia, without long-term current use of insulin ([place]) [E11.65] 07/19/2016   Total Time spent with patient: 20 minutes  Past Psychiatric  History: See admission H&P  Past Medical History:  Past Medical History:  Diagnosis Date  . Anxiety   . Depression   . Diabetes mellitus without complication ([place])    Type II  . Diabetes mellitus, type II ([place])    History reviewed. No pertinent surgical history. Family History: History reviewed. No pertinent family history. Family Psychiatric  History: See admission H&P Social History:  Social History   Substance and Sexual Activity  Alcohol Use Yes     Social History   Substance and Sexual Activity  Drug Use No    Social History   Socioeconomic History  . Marital status: Single    Spouse name: Not on file  . Number of children: Not on file  . Years of education: Not on file  . Highest education level: Not on file  Occupational History  . Not on file  Social Needs  . Financial resource strain: Not on file  . Food insecurity:    Worry: Not on file    Inability: Not on file  . Transportation needs:    Medical: Not on file    Non-medical: Not on file  Tobacco Use  . Smoking status: Never Smoker  . Smokeless tobacco: Never Used  Substance and Sexual Activity  . Alcohol use: Yes  . Drug use: No  . Sexual activity: Yes    Birth control/protection: None  Lifestyle  . Physical activity:    Days per week: Not on file    Minutes per session: Not on file  . Stress: Not on file  Relationships  . Social connections:    Talks  on phone: Not on file    Gets together: Not on file    Attends religious service: Not on file    Active member of club or organization: Not on file    Attends meetings of clubs or organizations: Not on file    Relationship status: Not on file  Other Topics Concern  . Not on file  Social History Narrative  . Not on file   Additional Social History:    Pain Medications: see MAR Prescriptions: see MAR Over the Counter: see MAR History of alcohol / drug use?: No history of alcohol / drug abuse                    Sleep:  Good  Appetite:  Fair  Current Medications: Current Facility-Administered Medications  Medication Dose Route Frequency Provider Last Rate Last Dose  . acetaminophen (TYLENOL) tablet 650 mg  650 mg Oral Q6H PRN Nanci Pina, FNP   650 mg at 05/06/18 2243  . alum & mag hydroxide-simeth (MAALOX/MYLANTA) 200-200-20 MG/5ML suspension 30 mL  30 mL Oral Q4H PRN Starkes, Takia S, FNP      . exenatide (BYETTA) immediate release injection  5 mcg Subcutaneous BID WC Sharma Covert, MD      . ferrous sulfate tablet 325 mg  325 mg Oral Q breakfast Sharma Covert, MD   325 mg at 05/07/18 0831  . FLUoxetine (PROZAC) capsule 20 mg  20 mg Oral Daily Sharma Covert, MD   20 mg at 05/07/18 0831  . gabapentin (NEURONTIN) capsule 300 mg  300 mg Oral TID Sharma Covert, MD   300 mg at 05/07/18 0831  . hydrOXYzine (ATARAX/VISTARIL) tablet 25 mg  25 mg Oral TID PRN Nanci Pina, FNP   25 mg at 05/06/18 2243  . magnesium hydroxide (MILK OF MAGNESIA) suspension 30 mL  30 mL Oral Daily PRN Lavina Hamman, Takia S, FNP      . metFORMIN (GLUCOPHAGE-XR) 24 hr tablet 1,000 mg  1,000 mg Oral BID WC Sharma Covert, MD   1,000 mg at 05/07/18 0831  . protriptyline (VIVACTIL) tablet 5 mg  5 mg Oral TID Sharma Covert, MD      . traZODone (DESYREL) tablet 50 mg  50 mg Oral QHS PRN Sharma Covert, MD   50 mg at 05/06/18 2243    Lab Results:  Results for orders placed or performed during the hospital encounter of 05/06/18 (from the past 48 hour(s))  Glucose, capillary     Status: Abnormal   Collection Time: 05/07/18  6:06 AM  Result Value Ref Range   Glucose-Capillary 120 (H) 70 - 99 mg/dL  Hemoglobin A1c     Status: Abnormal   Collection Time: 05/07/18  6:30 AM  Result Value Ref Range   Hgb A1c MFr Bld 6.5 (H) 4.8 - 5.6 %    Comment: (NOTE) Pre diabetes:          5.7%-6.4% Diabetes:              >6.4% Glycemic control for   <7.0% adults with diabetes    Mean Plasma Glucose 139.85 mg/dL     Comment: Performed at [facility], [place] [address] [place], [place] [address]  Lipid panel     Status: Abnormal   Collection Time: 05/07/18  6:30 AM  Result Value Ref Range   Cholesterol 181 0 - 200 mg/dL   Triglycerides 92 <150 mg/dL   HDL  46 >40 mg/dL   Total CHOL/HDL Ratio 3.9 RATIO   VLDL 18 0 - 40 mg/dL   LDL Cholesterol 117 (H) 0 - 99 mg/dL    Comment:        Total Cholesterol/HDL:CHD Risk Coronary Heart Disease Risk Table                     Men   Women  1/2 Average Risk   3.4   3.3  Average Risk       5.0   4.4  2 X Average Risk   9.6   7.1  3 X Average Risk  23.4   11.0        Use the calculated Patient Ratio above and the CHD Risk Table to determine the patient's CHD Risk.        ATP III CLASSIFICATION (LDL):  <100     mg/dL   Optimal  100-129  mg/dL   Near or Above                    Optimal  130-159  mg/dL   Borderline  160-189  mg/dL   High  >190     mg/dL   Very High Performed at [place] [address] [place], [place] [address]   TSH     Status: None   Collection Time: 05/07/18  6:30 AM  Result Value Ref Range   TSH 3.019 0.350 - 4.500 uIU/mL    Comment: Performed by a 3rd Generation assay with a functional sensitivity of <=0.01 uIU/mL. Performed at [facility], [place] [address], [place], [place] [address]   Comprehensive metabolic panel     Status: Abnormal   Collection Time: 05/07/18  6:30 AM  Result Value Ref Range   Sodium 138 135 - 145 mmol/L   Potassium 4.0 3.5 - 5.1 mmol/L   Chloride 103 98 - 111 mmol/L    Comment: Please note change in reference range.   CO2 28 22 - 32 mmol/L   Glucose, Bld 118 (H) 70 - 99 mg/dL    Comment: Please note change in reference range.   BUN 10 6 - 20 mg/dL    Comment: Please note change in reference range.   Creatinine, Ser 0.68 0.44 - 1.00 mg/dL   Calcium 9.3 8.9 - 10.3 mg/dL   Total Protein 8.1 6.5 - 8.1 g/dL   Albumin 3.8 3.5 - 5.0 g/dL   AST 14 (L) 15 - 41  U/L   ALT 11 0 - 44 U/L    Comment: Please note change in reference range.   Alkaline Phosphatase 49 38 - 126 U/L   Total Bilirubin 0.6 0.3 - 1.2 mg/dL   GFR calc non Af Amer >60 >60 mL/min   GFR calc Af Amer >60 >60 mL/min    Comment: (NOTE) The eGFR has been calculated using the CKD EPI equation. This calculation has not been validated in all clinical situations. eGFR's persistently <60 mL/min signify possible Chronic Kidney Disease.    Anion gap 7 5 - 15    Comment: Performed at [facility], [place] [address] [place], [place] [address]  CBC     Status: Abnormal   Collection Time: 05/07/18  6:30 AM  Result Value Ref Range   WBC 10.7 (H) 4.0 - 10.5 K/uL   RBC 4.51 3.87 - 5.11 MIL/uL   Hemoglobin 12.0 12.0 - 15.0 g/dL   HCT 37.5  36.0 - 46.0 %   MCV 83.1 78.0 - 100.0 fL   MCH 26.6 26.0 - 34.0 pg   MCHC 32.0 30.0 - 36.0 g/dL   RDW 14.9 11.5 - 15.5 %   Platelets 371 150 - 400 K/uL    Comment: Performed at [facility], [place] [address] [place], [place] [address]  Glucose, capillary     Status: Abnormal   Collection Time: 05/07/18 11:46 AM  Result Value Ref Range   Glucose-Capillary 108 (H) 70 - 99 mg/dL    Blood Alcohol level:  Lab Results  Component Value Date   ETH <5 60/45/4098    Metabolic Disorder Labs: Lab Results  Component Value Date   HGBA1C 6.5 (H) 05/07/2018   MPG 139.85 05/07/2018   No results found for: PROLACTIN Lab Results  Component Value Date   CHOL 181 05/07/2018   TRIG 92 05/07/2018   HDL 46 05/07/2018   CHOLHDL 3.9 05/07/2018   VLDL 18 05/07/2018   LDLCALC 117 (H) 05/07/2018    Physical Findings: AIMS: Facial and Oral Movements Muscles of Facial Expression: None, normal Lips and Perioral Area: None, normal Jaw: None, normal Tongue: None, normal,Extremity Movements Upper (arms, wrists, hands, fingers): None, normal Lower (legs, knees, ankles, toes): None, normal, Trunk Movements Neck, shoulders, hips:  None, normal, Overall Severity Severity of abnormal movements (highest score from questions above): None, normal Incapacitation due to abnormal movements: None, normal Patient's awareness of abnormal movements (rate only patient's report): No Awareness, Dental Status Current problems with teeth and/or dentures?: No Does patient usually wear dentures?: No  CIWA:    COWS:     Musculoskeletal: Strength & Muscle Tone: within normal limits Gait & Station: normal Patient leans: N/A  Psychiatric Specialty Exam: Physical Exam  Nursing note and vitals reviewed. Constitutional: She is oriented to person, place, and time. She appears well-developed and well-nourished.  HENT:  Head: Normocephalic and atraumatic.  Respiratory: Effort normal.  Neurological: She is alert and oriented to person, place, and time.    ROS  Blood pressure 93/71, pulse 81, temperature 98.1 F (36.7 C), temperature source Oral, resp. rate 16, height 5' 6.5" (1.689 m), weight 91.6 kg (202 lb), SpO2 100 %.Body mass index is 32.12 kg/m.  General Appearance: Casual  Eye Contact:  Minimal  Speech:  Slow  Volume:  Decreased  Mood:  Depressed  Affect:  Congruent  Thought Process:  Coherent  Orientation:  Full (Time, Place, and Person)  Thought Content:  Logical  Suicidal Thoughts:  Yes.  without intent/plan  Homicidal Thoughts:  No  Memory:  Immediate;   Fair Recent;   Fair Remote;   Fair  Judgement:  Impaired  Insight:  Lacking  Psychomotor Activity:  Decreased and Psychomotor Retardation  Concentration:  Concentration: Fair and Attention Span: Fair  Recall:  [employer] of Knowledge:  Fair  Language:  Fair  Akathisia:  Negative  Handed:  Right  AIMS (if indicated):     Assets:  Desire for Improvement Financial Resources/Insurance Housing Resilience Social Support  ADL's:  Intact  Cognition:  WNL  Sleep:        Treatment Plan Summary: Daily contact with patient to assess and evaluate symptoms and  progress in treatment, Medication management and Plan Patient is seen and examined.  Patient is a 22 year old female with the above-stated past psychiatric history seen in follow-up.  She still remains significantly depressed.  ECT consult is pending.  Her fluoxetine was restarted at 20 mg  a day yesterday.  She has apparently been treated with stimulants in the past, and I am concerned that the cost of Trintellix may be for him but it for her.  The hospital does have the availability of protriptyline, and Emina started 5 mg p.o. 3 times daily day.  We will see if that can get her energy levels a bit improved.  Her hemoglobin A1c was 6.5, and her blood sugar this morning was 108.  No change in that medication.  Nursing staff reported that her blood pressures were rather low this morning and this is Alex of 90s, so I am going to stop her metoprolol today.  Of note, her TSH was within normal limits.  Sharma Covert, MD 05/07/2018, 12:12 PM

## 2018-05-08 LAB — GLUCOSE, CAPILLARY
GLUCOSE-CAPILLARY: 126 mg/dL — AB (ref 70–99)
GLUCOSE-CAPILLARY: 130 mg/dL — AB (ref 70–99)
Glucose-Capillary: 143 mg/dL — ABNORMAL HIGH (ref 70–99)

## 2018-05-08 LAB — URINALYSIS, COMPLETE (UACMP) WITH MICROSCOPIC
BILIRUBIN URINE: NEGATIVE
Glucose, UA: NEGATIVE mg/dL
HGB URINE DIPSTICK: NEGATIVE
KETONES UR: NEGATIVE mg/dL
LEUKOCYTES UA: NEGATIVE
Nitrite: NEGATIVE
Protein, ur: NEGATIVE mg/dL
Specific Gravity, Urine: 1.026 (ref 1.005–1.030)
pH: 5 (ref 5.0–8.0)

## 2018-05-08 LAB — RAPID URINE DRUG SCREEN, HOSP PERFORMED
Amphetamines: NOT DETECTED
Benzodiazepines: NOT DETECTED
COCAINE: NOT DETECTED
OPIATES: NOT DETECTED
Tetrahydrocannabinol: NOT DETECTED

## 2018-05-08 MED ORDER — FLUOXETINE HCL 10 MG PO CAPS
30.0000 mg | ORAL_CAPSULE | Freq: Every day | ORAL | Status: DC
  Administered 2018-05-09: 30 mg via ORAL
  Filled 2018-05-08 (×2): qty 3

## 2018-05-08 NOTE — Progress Notes (Signed)
Specimen cup provided to pt. Education provided.

## 2018-05-08 NOTE — Progress Notes (Signed)
D: Patient denies SI, HI or AVH this evening. Patient presents as depressed and flat with minimal interaction.  Pt. States that she has been "sleeping a lot" and only attended one group today.  She is visualized in the dayroom minimally interacting with others and did attend evening wrap up group.  A: Patient given emotional support from RN. Patient encouraged to come to staff with concerns and/or questions. Patient's medication routine continued. Patient's orders and plan of care reviewed.   R: Patient remains appropriate and cooperative. Will continue to monitor patient q15 minutes for safety.

## 2018-05-08 NOTE — Plan of Care (Signed)
  Problem: Activity: Goal: Interest or engagement in activities will improve Outcome: Progressing   Problem: Health Behavior/Discharge Planning: Goal: Compliance with treatment plan for underlying cause of condition will improve Outcome: Progressing   Problem: Safety: Goal: Periods of time without injury will increase Outcome: Progressing   

## 2018-05-08 NOTE — Progress Notes (Signed)
Adult Psychoeducational Group Note  Date:  05/08/2018 Time:  10:09 PM  Group Topic/Focus:  Wrap-Up Group:   The focus of this group is to help patients review their daily goal of treatment and discuss progress on daily workbooks.  Participation Level:  Did Not Attend  Participation Quality:   Affect:    Cognitive:    Insight:   Engagement in Group:    Modes of Intervention:    Additional Comments: Pt invited to participated but declined.  Renee Turner 05/08/2018, 10:09 PM

## 2018-05-08 NOTE — Progress Notes (Signed)
Pt presents with a sad affect and depressed mood. Pt appeared guarded and withdrawn on approach. Pt forwarded little information during shift assessment by nodding her head yes or no. Pt denies SI. Pt expressed feeling sad and depressed.  Pt reported difficulty sleeping last night. Pt continues to isolate in her room. Writer discussed coping skills with pt. Pt verbalized that she likes to read. Writer encouraged pt to read in the dayroom after lunch. Pt denies any side effects to meds.  Orders reviewed with pt. Verbal support provided. Pt encouraged to attend groups. V/s assessed. 15 minute checks performed for safety.  Pt compliant with tx.

## 2018-05-08 NOTE — Progress Notes (Signed)
[facility] MD Progress Note  05/08/2018 1:28 PM Renee Turner  MRN:  [id number] Subjective: Patient is seen and examined.  Patient is a 22 year old female with a past psychiatric history significant for major depression as well as posttraumatic stress disorder who is seen in follow-up.  She is essentially unchanged.  We are still waiting for the ECT consult from Dr. Weber Cooks.  Hopefully that will be tomorrow.  I did order protriptyline 5 mg p.o. 3 times daily, and the hospital is waiting to have that arrived in the pharmacy.  As soon as it is here they will start it.  Otherwise she remains essentially unchanged.  She remains isolated, depressed, withdrawn.  We did discuss the possibility of increasing her fluoxetine today.  She stated that her suicidal thoughts were fleeting. Principal Problem: <principal problem not specified> Diagnosis:   Patient Active Problem List   Diagnosis Date Noted  . MDD (major depressive disorder), recurrent severe, without psychosis ([place]) [F33.2] 05/06/2018  . 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 Posttraumatic stress disorder [F43.10] 11/13/2016  . Severe episode of recurrent major depressive disorder, without psychotic features ([place]) [F33.2] 11/07/2016  . Diabetes ([place]) [E11.9] 11/07/2016  . Diabetic neuropathy associated with type 2 diabetes mellitus ([place]) [E11.40] 07/19/2016  . Obesity (BMI 30.0-34.9) [E66.9] 07/19/2016  . Type 2 diabetes mellitus with hyperglycemia, without long-term current use of insulin ([place]) [E11.65] 07/19/2016   Total Time spent with patient: 20 minutes  Past Psychiatric History: See admission H&P  Past Medical History:  Past Medical History:  Diagnosis Date  . Anxiety   . Depression   . Diabetes mellitus without complication ([place])    Type II  . Diabetes mellitus, type II ([place])    History reviewed. No pertinent surgical history. Family History: History reviewed. No pertinent family history. Family Psychiatric  History: See  admission H&P Social History:  Social History   Substance and Sexual Activity  Alcohol Use Yes     Social History   Substance and Sexual Activity  Drug Use No    Social History   Socioeconomic History  . Marital status: Single    Spouse name: Not on file  . Number of children: Not on file  . Years of education: Not on file  . Highest education level: Not on file  Occupational History  . Not on file  Social Needs  . Financial resource strain: Not on file  . Food insecurity:    Worry: Not on file    Inability: Not on file  . Transportation needs:    Medical: Not on file    Non-medical: Not on file  Tobacco Use  . Smoking status: Never Smoker  . Smokeless tobacco: Never Used  Substance and Sexual Activity  . Alcohol use: Yes  . Drug use: No  . Sexual activity: Yes    Birth control/protection: None  Lifestyle  . Physical activity:    Days per week: Not on file    Minutes per session: Not on file  . Stress: Not on file  Relationships  . Social connections:    Talks on phone: Not on file    Gets together: Not on file    Attends religious service: Not on file    Active member of club or organization: Not on file    Attends meetings of clubs or organizations: Not on file    Relationship status: Not on file  Other Topics Concern  . Not on file  Social History Narrative  . Not  on file   Additional Social History:    Pain Medications: see MAR Prescriptions: see MAR Over the Counter: see MAR History of alcohol / drug use?: No history of alcohol / drug abuse                    Sleep: Fair  Appetite:  Fair  Current Medications: Current Facility-Administered Medications  Medication Dose Route Frequency Provider Last Rate Last Dose  . acetaminophen (TYLENOL) tablet 650 mg  650 mg Oral Q6H PRN Nanci Pina, FNP   650 mg at 05/06/18 2243  . alum & mag hydroxide-simeth (MAALOX/MYLANTA) 200-200-20 MG/5ML suspension 30 mL  30 mL Oral Q4H PRN Starkes,  Takia S, FNP      . exenatide (BYETTA) immediate release injection  5 mcg Subcutaneous BID WC Sharma Covert, MD      . ferrous sulfate tablet 325 mg  325 mg Oral Q breakfast Sharma Covert, MD   325 mg at 05/08/18 0820  . FLUoxetine (PROZAC) capsule 20 mg  20 mg Oral Daily Sharma Covert, MD   20 mg at 05/08/18 0820  . gabapentin (NEURONTIN) capsule 300 mg  300 mg Oral TID Sharma Covert, MD   300 mg at 05/08/18 1211  . hydrOXYzine (ATARAX/VISTARIL) tablet 25 mg  25 mg Oral TID PRN Nanci Pina, FNP   25 mg at 05/06/18 2243  . magnesium hydroxide (MILK OF MAGNESIA) suspension 30 mL  30 mL Oral Daily PRN Lavina Hamman, Takia S, FNP      . metFORMIN (GLUCOPHAGE-XR) 24 hr tablet 1,000 mg  1,000 mg Oral BID WC Sharma Covert, MD   1,000 mg at 05/08/18 0820  . [START ON 05/09/2018] protriptyline (VIVACTIL) tablet 5 mg  5 mg Oral TID Cobos, Myer Peer, MD      . traZODone (DESYREL) tablet 50 mg  50 mg Oral QHS PRN Sharma Covert, MD   50 mg at 05/06/18 2243    Lab Results:  Results for orders placed or performed during the hospital encounter of 05/06/18 (from the past 48 hour(s))  Glucose, capillary     Status: Abnormal   Collection Time: 05/07/18  6:06 AM  Result Value Ref Range   Glucose-Capillary 120 (H) 70 - 99 mg/dL  Hemoglobin A1c     Status: Abnormal   Collection Time: 05/07/18  6:30 AM  Result Value Ref Range   Hgb A1c MFr Bld 6.5 (H) 4.8 - 5.6 %    Comment: (NOTE) Pre diabetes:          5.7%-6.4% Diabetes:              >6.4% Glycemic control for   <7.0% adults with diabetes    Mean Plasma Glucose 139.85 mg/dL    Comment: Performed at [facility], [place] [address], [place], [place] [address]  Lipid panel     Status: Abnormal   Collection Time: 05/07/18  6:30 AM  Result Value Ref Range   Cholesterol 181 0 - 200 mg/dL   Triglycerides 92 <150 mg/dL   HDL 46 >40 mg/dL   Total CHOL/HDL Ratio 3.9 RATIO   VLDL 18 0 - 40 mg/dL   LDL Cholesterol 117 (H) 0 - 99  mg/dL    Comment:        Total Cholesterol/HDL:CHD Risk Coronary Heart Disease Risk Table  Men   Women  1/2 Average Risk   3.4   3.3  Average Risk       5.0   4.4  2 X Average Risk   9.6   7.1  3 X Average Risk  23.4   11.0        Use the calculated Patient Ratio above and the CHD Risk Table to determine the patient's CHD Risk.        ATP III CLASSIFICATION (LDL):  <100     mg/dL   Optimal  100-129  mg/dL   Near or Above                    Optimal  130-159  mg/dL   Borderline  160-189  mg/dL   High  >190     mg/dL   Very High Performed at [facility] [address], [place], [place] [address]   TSH     Status: None   Collection Time: 05/07/18  6:30 AM  Result Value Ref Range   TSH 3.019 0.350 - 4.500 uIU/mL    Comment: Performed by a 3rd Generation assay with a functional sensitivity of <=0.01 uIU/mL. Performed at [facility], [place] [address] [place], [place] [address]   Comprehensive metabolic panel     Status: Abnormal   Collection Time: 05/07/18  6:30 AM  Result Value Ref Range   Sodium 138 135 - 145 mmol/L   Potassium 4.0 3.5 - 5.1 mmol/L   Chloride 103 98 - 111 mmol/L    Comment: Please note change in reference range.   CO2 28 22 - 32 mmol/L   Glucose, Bld 118 (H) 70 - 99 mg/dL    Comment: Please note change in reference range.   BUN 10 6 - 20 mg/dL    Comment: Please note change in reference range.   Creatinine, Ser 0.68 0.44 - 1.00 mg/dL   Calcium 9.3 8.9 - 10.3 mg/dL   Total Protein 8.1 6.5 - 8.1 g/dL   Albumin 3.8 3.5 - 5.0 g/dL   AST 14 (L) 15 - 41 U/L   ALT 11 0 - 44 U/L    Comment: Please note change in reference range.   Alkaline Phosphatase 49 38 - 126 U/L   Total Bilirubin 0.6 0.3 - 1.2 mg/dL   GFR calc non Af Amer >60 >60 mL/min   GFR calc Af Amer >60 >60 mL/min    Comment: (NOTE) The eGFR has been calculated using the CKD EPI equation. This calculation has not been validated in all  clinical situations. eGFR's persistently <60 mL/min signify possible Chronic Kidney Disease.    Anion gap 7 5 - 15    Comment: Performed at [facility], [place] [address], [place], [place] [address]  CBC     Status: Abnormal   Collection Time: 05/07/18  6:30 AM  Result Value Ref Range   WBC 10.7 (H) 4.0 - 10.5 K/uL   RBC 4.51 3.87 - 5.11 MIL/uL   Hemoglobin 12.0 12.0 - 15.0 g/dL   HCT 37.5 36.0 - 46.0 %   MCV 83.1 78.0 - 100.0 fL   MCH 26.6 26.0 - 34.0 pg   MCHC 32.0 30.0 - 36.0 g/dL   RDW 14.9 11.5 - 15.5 %   Platelets 371 150 - 400 K/uL    Comment: Performed at [facility], [place] [address] [place], [place] [address]  Glucose, capillary  Status: Abnormal   Collection Time: 05/07/18 11:46 AM  Result Value Ref Range   Glucose-Capillary 108 (H) 70 - 99 mg/dL  Glucose, capillary     Status: Abnormal   Collection Time: 05/07/18  5:02 PM  Result Value Ref Range   Glucose-Capillary 144 (H) 70 - 99 mg/dL   Comment 1 Notify RN    Comment 2 Document in Chart   Glucose, capillary     Status: Abnormal   Collection Time: 05/08/18  6:54 AM  Result Value Ref Range   Glucose-Capillary 126 (H) 70 - 99 mg/dL  Glucose, capillary     Status: Abnormal   Collection Time: 05/08/18 11:59 AM  Result Value Ref Range   Glucose-Capillary 130 (H) 70 - 99 mg/dL   Comment 1 Notify RN    Comment 2 Document in Chart     Blood Alcohol level:  Lab Results  Component Value Date   ETH <5 97/98/9211    Metabolic Disorder Labs: Lab Results  Component Value Date   HGBA1C 6.5 (H) 05/07/2018   MPG 139.85 05/07/2018   No results found for: PROLACTIN Lab Results  Component Value Date   CHOL 181 05/07/2018   TRIG 92 05/07/2018   HDL 46 05/07/2018   CHOLHDL 3.9 05/07/2018   VLDL 18 05/07/2018   LDLCALC 117 (H) 05/07/2018    Physical Findings: AIMS: Facial and Oral Movements Muscles of Facial Expression: None, normal Lips and Perioral Area: None,  normal Jaw: None, normal Tongue: None, normal,Extremity Movements Upper (arms, wrists, hands, fingers): None, normal Lower (legs, knees, ankles, toes): None, normal, Trunk Movements Neck, shoulders, hips: None, normal, Overall Severity Severity of abnormal movements (highest score from questions above): None, normal Incapacitation due to abnormal movements: None, normal Patient's awareness of abnormal movements (rate only patient's report): No Awareness, Dental Status Current problems with teeth and/or dentures?: No Does patient usually wear dentures?: No  CIWA:    COWS:     Musculoskeletal: Strength & Muscle Tone: within normal limits Gait & Station: normal Patient leans: N/A  Psychiatric Specialty Exam: Physical Exam  Nursing note and vitals reviewed. Constitutional: She is oriented to person, place, and time. She appears well-developed and well-nourished.  HENT:  Head: Normocephalic and atraumatic.  Respiratory: Effort normal.  Neurological: She is alert and oriented to person, place, and time.    ROS  Blood pressure 101/71, pulse 92, temperature 98.5 F (36.9 C), temperature source Oral, resp. rate 16, height 5' 6.5" (1.689 m), weight 91.6 kg (202 lb), SpO2 100 %.Body mass index is 32.12 kg/m.  General Appearance: Disheveled  Eye Contact:  Fair  Speech:  Slow  Volume:  Decreased  Mood:  Depressed  Affect:  Congruent  Thought Process:  Coherent  Orientation:  Full (Time, Place, and Person)  Thought Content:  Logical  Suicidal Thoughts:  Yes.  without intent/plan  Homicidal Thoughts:  No  Memory:  Immediate;   Fair Recent;   Fair Remote;   Fair  Judgement:  Intact  Insight:  Fair  Psychomotor Activity:  Decreased and Psychomotor Retardation  Concentration:  Concentration: Fair and Attention Span: Fair  Recall:  [employer] of Knowledge:  Fair  Language:  Fair  Akathisia:  Negative  Handed:  Right  AIMS (if indicated):     Assets:  Desire for  Improvement Financial Resources/Insurance Housing Physical Health Resilience Social Support  ADL's:  Intact  Cognition:  WNL  Sleep:  Number of Hours: 6.25     Treatment  Plan Summary: Daily contact with patient to assess and evaluate symptoms and progress in treatment, Medication management and Plan Patient is seen and examined.  Patient is a 22 year old female with the above-stated past psychiatric history seen in follow-up.  She remains severely depressed.  I am going to increase her fluoxetine to 30 mg p.o. daily.  We are waiting for the protriptyline to get to our pharmacy so we can start that.  Additionally, we are waiting for her ECT consult.  Hopefully that will all take place shortly, and we can get this woman treated.  No other changes in her medications.  Sharma Covert, MD 05/08/2018, 1:28 PM

## 2018-05-08 NOTE — Plan of Care (Signed)
D: Pt denies SI/HI/AVH. Pt is pleasant and cooperative. Pt presented with depressed affect, pt did talk when engaged . Pt spent some time in the dayroom , but did keep to herself.   A: Pt was offered support and encouragement. Pt was given scheduled medications. Pt was encourage to attend groups. Q 15 minute checks were done for safety.   R: Pt is taking medication. Pt receptive to treatment and safety maintained on unit.   Problem: Activity: Goal: Interest or engagement in activities will improve Outcome: Progressing   Problem: Activity: Goal: Sleeping patterns will improve Outcome: Progressing   Problem: Safety: Goal: Periods of time without injury will increase Outcome: Progressing

## 2018-05-08 NOTE — Progress Notes (Signed)
Adult Psychoeducational Group Note  Date:  05/08/2018 Time:  1:05 AM  Group Topic/Focus:  Wrap-Up Group:   The focus of this group is to help patients review their daily goal of treatment and discuss progress on daily workbooks.  Participation Level:  Active  Participation Quality:  Attentive  Affect:  Appropriate  Cognitive:  Appropriate   Insight: Appropriate  Engagement in Group:  Engaged  Modes of Intervention:  Discussion  Additional Comments:  Pt expressed that she would like to make an appointment at [facility].  Pt rated her day at a 5/10.  Rodney Yera 05/08/2018, 1:05 AM

## 2018-05-08 NOTE — Progress Notes (Signed)
Assuming care- SBARR received from Erika RN @ 0210 pt resting in bed with eyes closed. Respirations even and unlabored. Pt continues to remain safe on the unit/ Observed by 15 min rounds. RN will continue to monitor. 

## 2018-05-09 ENCOUNTER — Encounter: Payer: Self-pay | Admitting: Registered Nurse

## 2018-05-09 LAB — GLUCOSE, CAPILLARY
Glucose-Capillary: 108 mg/dL — ABNORMAL HIGH (ref 70–99)
Glucose-Capillary: 133 mg/dL — ABNORMAL HIGH (ref 70–99)
Glucose-Capillary: 172 mg/dL — ABNORMAL HIGH (ref 70–99)

## 2018-05-09 MED ORDER — FLUOXETINE HCL 20 MG PO CAPS
40.0000 mg | ORAL_CAPSULE | Freq: Every day | ORAL | Status: DC
  Administered 2018-05-10 – 2018-05-14 (×5): 40 mg via ORAL
  Filled 2018-05-09 (×7): qty 2

## 2018-05-09 NOTE — Progress Notes (Signed)
Patient ID: Renee Turner, female   DOB: Feb 13, 1996, 22 y.o.   MRN: [id number]  Nursing Progress Note [id number]  Data: Patient presents flat, sad, depressed and withdrawn. Patient is isolative to her room. Patient does not engage much with Probation officer. Patient complaint with scheduled medications. Patient denies active SI/HI/AVH or pain.  Action: Patient educated about and provided medication per provider's orders. Patient safety maintained with q15 min safety checks and frequent rounding. Low fall risk precautions in place. Emotional support given. 1:1 interaction and active listening provided. Patient encouraged to attend meals and groups. Patient encouraged to work on treatment plan and goals. Labs, vital signs and patient behavior monitored throughout shift.   Response: Patient agrees to come to staff if any thoughts of SI/HI develop or if patient develops intention of acting on thoughts. Patient remains safe on the unit at this time. Patient is interacting with peers appropriately on the unit. Will continue to support and monitor.

## 2018-05-09 NOTE — Progress Notes (Signed)
D:  Renee Turner has been up and more visible on the unit tonight.  Minimal interaction with staff and peers.  She continues to report feeling depressed and tired.  She reported she didn't attend groups today so she was encouraged to attend evening wrap up group.  She did attend but "I just listened."  She denied SI/HI or A/V hallucinations.  She is looking forward to talking with the doctor about ECT but denied any questions about it this time.   A:  1:1 with RN for support and encouragement.  Medications as ordered.  Q 15 minute checks maintained for safety.  Encouraged participation in group and unit activities.   R:  Renee Turner remains safe on the unit.  We will continue to monitor the progress towards her goals.

## 2018-05-09 NOTE — Progress Notes (Signed)
[facility] MD Progress Note  05/09/2018 11:22 AM Renee Turner  MRN:  [id number] Subjective: Patient is seen and examined.  Patient is a 22 year old female with a past psychiatric history significant for major depression as well as posttraumatic stress disorder.  She is seen in follow-up.  She remains essentially unchanged.  Reportedly her ECT consult will take place today.  Pharmacy stated that the protriptyline should be here today.  She remains isolated, staying in her room, remaining in bed, keeping the room dark.  We discussed that this morning.  She stated she felt a little bit more anxious today.  We discussed the fact that increased her Prozac yesterday.  She continued to have fleeting suicidal ideation. Principal Problem: <principal problem not specified> Diagnosis:   Patient Active Problem List   Diagnosis Date Noted  . MDD (major depressive disorder), recurrent severe, without psychosis ([place]) [F33.2] 05/06/2018  . 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 Posttraumatic stress disorder [F43.10] 11/13/2016  . Severe episode of recurrent major depressive disorder, without psychotic features ([place]) [F33.2] 11/07/2016  . Diabetes ([place]) [E11.9] 11/07/2016  . Diabetic neuropathy associated with type 2 diabetes mellitus ([place]) [E11.40] 07/19/2016  . Obesity (BMI 30.0-34.9) [E66.9] 07/19/2016  . Type 2 diabetes mellitus with hyperglycemia, without long-term current use of insulin ([place]) [E11.65] 07/19/2016   Total Time spent with patient: 20 minutes  Past Psychiatric History: See admission H&P  Past Medical History:  Past Medical History:  Diagnosis Date  . Anxiety   . Depression   . Diabetes mellitus without complication ([place])    Type II  . Diabetes mellitus, type II ([place])    History reviewed. No pertinent surgical history. Family History: History reviewed. No pertinent family history. Family Psychiatric  History: See admission H&P Social History:  Social History   Substance and  Sexual Activity  Alcohol Use Yes     Social History   Substance and Sexual Activity  Drug Use No    Social History   Socioeconomic History  . Marital status: Single    Spouse name: Not on file  . Number of children: Not on file  . Years of education: Not on file  . Highest education level: Not on file  Occupational History  . Not on file  Social Needs  . Financial resource strain: Not on file  . Food insecurity:    Worry: Not on file    Inability: Not on file  . Transportation needs:    Medical: Not on file    Non-medical: Not on file  Tobacco Use  . Smoking status: Never Smoker  . Smokeless tobacco: Never Used  Substance and Sexual Activity  . Alcohol use: Yes  . Drug use: No  . Sexual activity: Yes    Birth control/protection: None  Lifestyle  . Physical activity:    Days per week: Not on file    Minutes per session: Not on file  . Stress: Not on file  Relationships  . Social connections:    Talks on phone: Not on file    Gets together: Not on file    Attends religious service: Not on file    Active member of club or organization: Not on file    Attends meetings of clubs or organizations: Not on file    Relationship status: Not on file  Other Topics Concern  . Not on file  Social History Narrative  . Not on file   Additional Social History:    Pain Medications: see MAR  Prescriptions: see MAR Over the Counter: see MAR History of alcohol / drug use?: No history of alcohol / drug abuse                    Sleep: Good  Appetite:  Poor  Current Medications: Current Facility-Administered Medications  Medication Dose Route Frequency Provider Last Rate Last Dose  . acetaminophen (TYLENOL) tablet 650 mg  650 mg Oral Q6H PRN Nanci Pina, FNP   650 mg at 05/06/18 2243  . alum & mag hydroxide-simeth (MAALOX/MYLANTA) 200-200-20 MG/5ML suspension 30 mL  30 mL Oral Q4H PRN Starkes, Takia S, FNP      . exenatide (BYETTA) immediate release injection   5 mcg Subcutaneous BID WC Sharma Covert, MD      . ferrous sulfate tablet 325 mg  325 mg Oral Q breakfast Sharma Covert, MD   325 mg at 05/09/18 0915  . FLUoxetine (PROZAC) capsule 30 mg  30 mg Oral Daily Sharma Covert, MD   30 mg at 05/09/18 0915  . gabapentin (NEURONTIN) capsule 300 mg  300 mg Oral TID Sharma Covert, MD   300 mg at 05/09/18 0915  . hydrOXYzine (ATARAX/VISTARIL) tablet 25 mg  25 mg Oral TID PRN Nanci Pina, FNP   25 mg at 05/08/18 2154  . magnesium hydroxide (MILK OF MAGNESIA) suspension 30 mL  30 mL Oral Daily PRN Lavina Hamman, Takia S, FNP      . metFORMIN (GLUCOPHAGE-XR) 24 hr tablet 1,000 mg  1,000 mg Oral BID WC Sharma Covert, MD   1,000 mg at 05/09/18 0915  . protriptyline (VIVACTIL) tablet 5 mg  5 mg Oral TID Cobos, Myer Peer, MD      . traZODone (DESYREL) tablet 50 mg  50 mg Oral QHS PRN Sharma Covert, MD   50 mg at 05/08/18 2154    Lab Results:  Results for orders placed or performed during the hospital encounter of 05/06/18 (from the past 48 hour(s))  Glucose, capillary     Status: Abnormal   Collection Time: 05/07/18 11:46 AM  Result Value Ref Range   Glucose-Capillary 108 (H) 70 - 99 mg/dL  Glucose, capillary     Status: Abnormal   Collection Time: 05/07/18  5:02 PM  Result Value Ref Range   Glucose-Capillary 144 (H) 70 - 99 mg/dL   Comment 1 Notify RN    Comment 2 Document in Chart   Glucose, capillary     Status: Abnormal   Collection Time: 05/08/18  6:54 AM  Result Value Ref Range   Glucose-Capillary 126 (H) 70 - 99 mg/dL  Glucose, capillary     Status: Abnormal   Collection Time: 05/08/18 11:59 AM  Result Value Ref Range   Glucose-Capillary 130 (H) 70 - 99 mg/dL   Comment 1 Notify RN    Comment 2 Document in Chart   Glucose, capillary     Status: Abnormal   Collection Time: 05/08/18  5:27 PM  Result Value Ref Range   Glucose-Capillary 143 (H) 70 - 99 mg/dL   Comment 1 Notify RN    Comment 2 Document in Chart    Urine rapid drug screen (hosp performed)not at [facility]     Status: Abnormal   Collection Time: 05/08/18  5:37 PM  Result Value Ref Range   Opiates NONE DETECTED NONE DETECTED   Cocaine NONE DETECTED NONE DETECTED   Benzodiazepines NONE DETECTED NONE DETECTED   Amphetamines NONE DETECTED NONE DETECTED  Tetrahydrocannabinol NONE DETECTED NONE DETECTED   Barbiturates (A) NONE DETECTED    Result not available. Reagent lot number recalled by manufacturer.    Comment: Performed at [facility], [place] [address], [place], [place] [address]  Urinalysis, Complete w Microscopic     Status: Abnormal   Collection Time: 05/08/18  5:37 PM  Result Value Ref Range   Color, Urine YELLOW YELLOW   APPearance TURBID (A) CLEAR   Specific Gravity, Urine 1.026 1.005 - 1.030   pH 5.0 5.0 - 8.0   Glucose, UA NEGATIVE NEGATIVE mg/dL   Hgb urine dipstick NEGATIVE NEGATIVE   Bilirubin Urine NEGATIVE NEGATIVE   Ketones, ur NEGATIVE NEGATIVE mg/dL   Protein, ur NEGATIVE NEGATIVE mg/dL   Nitrite NEGATIVE NEGATIVE   Leukocytes, UA NEGATIVE NEGATIVE   RBC / HPF 0-5 0 - 5 RBC/hpf   Bacteria, UA RARE (A) NONE SEEN   Squamous Epithelial / LPF 0-5 0 - 5   Mucus PRESENT    Amorphous Crystal PRESENT     Comment: Performed at [facility], [place] [address], [place], [place] [address]  Glucose, capillary     Status: Abnormal   Collection Time: 05/09/18  6:25 AM  Result Value Ref Range   Glucose-Capillary 108 (H) 70 - 99 mg/dL    Blood Alcohol level:  Lab Results  Component Value Date   ETH <5 33/35/4562    Metabolic Disorder Labs: Lab Results  Component Value Date   HGBA1C 6.5 (H) 05/07/2018   MPG 139.85 05/07/2018   No results found for: PROLACTIN Lab Results  Component Value Date   CHOL 181 05/07/2018   TRIG 92 05/07/2018   HDL 46 05/07/2018   CHOLHDL 3.9 05/07/2018   VLDL 18 05/07/2018   LDLCALC 117 (H) 05/07/2018    Physical Findings: AIMS: Facial and Oral  Movements Muscles of Facial Expression: None, normal Lips and Perioral Area: None, normal Jaw: None, normal Tongue: None, normal,Extremity Movements Upper (arms, wrists, hands, fingers): None, normal Lower (legs, knees, ankles, toes): None, normal, Trunk Movements Neck, shoulders, hips: None, normal, Overall Severity Severity of abnormal movements (highest score from questions above): None, normal Incapacitation due to abnormal movements: None, normal Patient's awareness of abnormal movements (rate only patient's report): No Awareness, Dental Status Current problems with teeth and/or dentures?: No Does patient usually wear dentures?: No  CIWA:    COWS:     Musculoskeletal: Strength & Muscle Tone: within normal limits Gait & Station: normal Patient leans: N/A  Psychiatric Specialty Exam: Physical Exam  Nursing note and vitals reviewed. Constitutional: She is oriented to person, place, and time. She appears well-developed and well-nourished.  HENT:  Head: Normocephalic and atraumatic.  Respiratory: Effort normal.  Neurological: She is alert and oriented to person, place, and time.    ROS  Blood pressure 94/66, pulse (!) 102, temperature 98.5 F (36.9 C), temperature source Oral, resp. rate 16, height 5' 6.5" (1.689 m), weight 91.6 kg (202 lb), SpO2 100 %.Body mass index is 32.12 kg/m.  General Appearance: Disheveled  Eye Contact:  Poor  Speech:  Slow  Volume:  Decreased  Mood:  Depressed  Affect:  Congruent  Thought Process:  Disorganized  Orientation:  Full (Time, Place, and Person)  Thought Content:  Logical  Suicidal Thoughts:  Yes.  without intent/plan  Homicidal Thoughts:  No  Memory:  Immediate;   Fair Recent;   Fair Remote;   Fair  Judgement:  Impaired  Insight:  [place]  Psychomotor Activity:  Decreased and Psychomotor Retardation  Concentration:  Concentration: Fair and Attention Span: Fair  Recall:  [place] of Knowledge:  Good  Language:  Good   Akathisia:  Negative  Handed:  Right  AIMS (if indicated):     Assets:  Desire for Improvement Resilience  ADL's:  Intact  Cognition:  WNL  Sleep:  Number of Hours: 6.75     Treatment Plan Summary: Daily contact with patient to assess and evaluate symptoms and progress in treatment, Medication management and Plan Patient is seen and examined.  Patient is a 22 year old female with the above-stated past psychiatric history seen in follow-up.  She remains severely depressed.  We are waiting for the ECT consult.  Pharmacy should have the protriptyline today.  I am going to increase her Prozac to 40 mg p.o. daily.  Hopefully the protriptyline will be able to be started today.  No other changes in her medications at this time.  My hope is that ECT will be appropriate for her and treatment.  Sharma Covert, MD 05/09/2018, 11:22 AM

## 2018-05-09 NOTE — Progress Notes (Signed)
Adult Psychoeducational Group Note  Date:  05/09/2018 Time:  10:54 PM  Group Topic/Focus:  Wrap-Up Group:   The focus of this group is to help patients review their daily goal of treatment and discuss progress on daily workbooks.  Participation Level:  Active  Participation Quality:  Appropriate  Affect:  Appropriate  Cognitive:  Appropriate  Insight: Appropriate  Engagement in Group:  Engaged  Modes of Intervention:  Discussion  Additional Comments:  Pt expressed being frustrated that she still does not have an appointment set up with one of the doctors at [facility].  Pt rated the day at a 5/10.  Grey Schlauch 05/09/2018, 10:54 PM

## 2018-05-09 NOTE — Plan of Care (Signed)
Problem: Activity: Goal: Interest or engagement in activities will improve Outcome: Progressing   Problem: Education: Goal: Emotional status will improve Outcome: Not Progressing   Problem: Education: Goal: Mental status will improve Outcome: Not Progressing   Renee Turner continues to report feeling depressed, sad and hopeless.  She did attend evening wrap up group with much encouragement.  She did not participate but did listen.  We will continue to monitor the progress towards her goals.

## 2018-05-09 NOTE — [facility] Group Notes (Signed)
[facility] Group Notes:  (Nursing/MHT/Case Management/Adjunct)  Date:  05/09/2018  Time:  4:00 pm  Type of Therapy:  Nurse Education  Participation Level:  Did Not Attend  Summary of Progress/Problems:pt didn't attend group  Raylene MiyamotoMichael R Manson [place] 05/09/2018, 6:10 PM

## 2018-05-09 NOTE — Progress Notes (Signed)
Recreation Therapy Notes  Date: 7.5.19 Time: 0930 Location: [address] Dayroom  Group Topic: Stress Management  Goal Area(s) Addresses:  Patient will verbalize importance of using healthy stress management.  Patient will identify positive emotions associated with healthy stress management.   Intervention: Stress Management  Activity :  Meditation.  LRT introduced the stress management technique of meditation.  LRT played Turner meditation on resilience that allowed patients to focus on being able to withstand obstacles that arise.  Patients were to listen and follow along as meditation played to engage in the activity.  Education:  Stress Management, Discharge Planning.   Education Outcome: Acknowledges edcuation/In group clarification offered/Needs additional education  Clinical Observations/Feedback: Pt did not attend group.     Renee RancherMarjette Ilo Turner, LRT/CTRS         Renee Turner, Renee Turner 05/09/2018 11:05 AM

## 2018-05-10 LAB — GLUCOSE, CAPILLARY
Glucose-Capillary: 104 mg/dL — ABNORMAL HIGH (ref 70–99)
Glucose-Capillary: 135 mg/dL — ABNORMAL HIGH (ref 70–99)
Glucose-Capillary: 122 mg/dL — ABNORMAL HIGH (ref 70–99)

## 2018-05-10 MED ORDER — PROTRIPTYLINE HCL 10 MG PO TABS
10.0000 mg | ORAL_TABLET | Freq: Three times a day (TID) | ORAL | Status: DC
  Administered 2018-05-10 – 2018-05-14 (×13): 10 mg via ORAL
  Filled 2018-05-10 (×18): qty 1

## 2018-05-10 NOTE — Plan of Care (Signed)
  Problem: Education: Goal: Emotional status will improve Outcome: Not Progressing   

## 2018-05-10 NOTE — [facility] Group Notes (Signed)
LCSW Group Therapy Note  05/10/2018   10:00--11:00am   Type of Therapy and Topic:  Group Therapy: Anger Cues and Responses  Participation Level:  Did Not Attend   Description of Group:   In this group, patients learned how to recognize the physical, cognitive, emotional, and behavioral responses they have to anger-provoking situations.  They identified a recent time they became angry and how they reacted.  They analyzed how their reaction was possibly beneficial and how it was possibly unhelpful.  The group discussed a variety of healthier coping skills that could help with such a situation in the future.  Deep breathing was practiced briefly.  Therapeutic Goals: 1. Patients will remember their last incident of anger and how they felt emotionally and physically, what their thoughts were at the time, and how they behaved. 2. Patients will identify how their behavior at that time worked for them, as well as how it worked against them. 3. Patients will explore possible new behaviors to use in future anger situations. 4. Patients will learn that anger itself is normal and cannot be eliminated, and that healthier reactions can assist with resolving conflict rather than worsening situations.  Summary of Patient Progress:    Therapeutic Modalities:   Cognitive Behavioral Therapy  Henrene Dodgeonnie Keath [place], LCSW  Evorn Gongonnie D Bari Handshoe

## 2018-05-10 NOTE — [facility] Group Notes (Signed)
[facility] Group Notes:  (Nursing)  Date:  05/10/2018  Time: 1:15 PM Type of Therapy:  Nurse Education  Participation Level:  Active  Participation Quality:  Appropriate and Attentive  Affect:  Appropriate  Cognitive:  Appropriate  Insight:  Appropriate  Engagement in Group:  Engaged  Modes of Intervention:  Discussion and Education  Summary:  Nurse led group: Identifying Needs  Shela NevinValerie S Idalia Allbritton 05/10/2018, 4:04 PM

## 2018-05-10 NOTE — Progress Notes (Signed)
D Pt remains acutely depressed. She endorses psychomotor retardation, she moves slowly, she speaks slowly, she even walks slowly.      A She did complete her daily assessment and on this she wrote she denied SI today and on this she rated her depression, hopelessness and anxeity " "4/5/3", respectively.      R Safety I sin place.

## 2018-05-10 NOTE — Progress Notes (Signed)
[facility] MD Progress Note  05/10/2018 9:45 AM Renee Turner  MRN:  [id number] Subjective: Patient is seen and examined.  Patient is a 22 year old female with a past psychiatric history significant for major depression.  She is seen in follow-up.  She again is staying in her room a great deal.  She once again is seen in her room, she is lying in bed with the lights off.  She stated that she just did not feel like going to groups today.  Unfortunately she still waiting for the ECT consultation.  She denied current suicidal or homicidal ideation.  She still remains significantly depressed.  She did get the protriptyline yesterday, and she denied any side effects to it, but also denied any benefit at least at this point. Principal Problem: <principal problem not specified> Diagnosis:   Patient Active Problem List   Diagnosis Date Noted  . MDD (major depressive disorder), recurrent severe, without psychosis ([place]) [F33.2] 05/06/2018  . 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 Posttraumatic stress disorder [F43.10] 11/13/2016  . Severe episode of recurrent major depressive disorder, without psychotic features ([place]) [F33.2] 11/07/2016  . Diabetes ([place]) [E11.9] 11/07/2016  . Diabetic neuropathy associated with type 2 diabetes mellitus ([place]) [E11.40] 07/19/2016  . Obesity (BMI 30.0-34.9) [E66.9] 07/19/2016  . Type 2 diabetes mellitus with hyperglycemia, without long-term current use of insulin ([place]) [E11.65] 07/19/2016   Total Time spent with patient: 20 minutes  Past Psychiatric History: See admission H&P  Past Medical History:  Past Medical History:  Diagnosis Date  . Anxiety   . Depression   . Diabetes mellitus without complication ([place])    Type II  . Diabetes mellitus, type II ([place])    History reviewed. No pertinent surgical history. Family History: History reviewed. No pertinent family history. Family Psychiatric  History: See admission H&P Social History:  Social History   Substance  and Sexual Activity  Alcohol Use Yes     Social History   Substance and Sexual Activity  Drug Use No    Social History   Socioeconomic History  . Marital status: Single    Spouse name: Not on file  . Number of children: Not on file  . Years of education: Not on file  . Highest education level: Not on file  Occupational History  . Not on file  Social Needs  . Financial resource strain: Not on file  . Food insecurity:    Worry: Not on file    Inability: Not on file  . Transportation needs:    Medical: Not on file    Non-medical: Not on file  Tobacco Use  . Smoking status: Never Smoker  . Smokeless tobacco: Never Used  Substance and Sexual Activity  . Alcohol use: Yes  . Drug use: No  . Sexual activity: Yes    Birth control/protection: None  Lifestyle  . Physical activity:    Days per week: Not on file    Minutes per session: Not on file  . Stress: Not on file  Relationships  . Social connections:    Talks on phone: Not on file    Gets together: Not on file    Attends religious service: Not on file    Active member of club or organization: Not on file    Attends meetings of clubs or organizations: Not on file    Relationship status: Not on file  Other Topics Concern  . Not on file  Social History Narrative  . Not on file  Additional Social History:    Pain Medications: see MAR Prescriptions: see MAR Over the Counter: see MAR History of alcohol / drug use?: No history of alcohol / drug abuse                    Sleep: Good  Appetite:  Fair  Current Medications: Current Facility-Administered Medications  Medication Dose Route Frequency Provider Last Rate Last Dose  . acetaminophen (TYLENOL) tablet 650 mg  650 mg Oral Q6H PRN Nanci Pina, FNP   650 mg at 05/09/18 2223  . alum & mag hydroxide-simeth (MAALOX/MYLANTA) 200-200-20 MG/5ML suspension 30 mL  30 mL Oral Q4H PRN Starkes, Takia S, FNP      . exenatide (BYETTA) immediate release  injection  5 mcg Subcutaneous BID WC Sharma Covert, MD      . ferrous sulfate tablet 325 mg  325 mg Oral Q breakfast Sharma Covert, MD   325 mg at 05/10/18 1610  . FLUoxetine (PROZAC) capsule 40 mg  40 mg Oral Daily Sharma Covert, MD   40 mg at 05/10/18 9604  . gabapentin (NEURONTIN) capsule 300 mg  300 mg Oral TID Sharma Covert, MD   300 mg at 05/10/18 0903  . hydrOXYzine (ATARAX/VISTARIL) tablet 25 mg  25 mg Oral TID PRN Nanci Pina, FNP   25 mg at 05/09/18 2221  . magnesium hydroxide (MILK OF MAGNESIA) suspension 30 mL  30 mL Oral Daily PRN Lavina Hamman, Takia S, FNP      . metFORMIN (GLUCOPHAGE-XR) 24 hr tablet 1,000 mg  1,000 mg Oral BID WC Sharma Covert, MD   1,000 mg at 05/10/18 0903  . protriptyline (VIVACTIL) tablet 10 mg  10 mg Oral TID Sharma Covert, MD      . traZODone (DESYREL) tablet 50 mg  50 mg Oral QHS PRN Sharma Covert, MD   50 mg at 05/09/18 2221    Lab Results:  Results for orders placed or performed during the hospital encounter of 05/06/18 (from the past 48 hour(s))  Glucose, capillary     Status: Abnormal   Collection Time: 05/08/18 11:59 AM  Result Value Ref Range   Glucose-Capillary 130 (H) 70 - 99 mg/dL   Comment 1 Notify RN    Comment 2 Document in Chart   Glucose, capillary     Status: Abnormal   Collection Time: 05/08/18  5:27 PM  Result Value Ref Range   Glucose-Capillary 143 (H) 70 - 99 mg/dL   Comment 1 Notify RN    Comment 2 Document in Chart   Urine rapid drug screen (hosp performed)not at [facility]     Status: Abnormal   Collection Time: 05/08/18  5:37 PM  Result Value Ref Range   Opiates NONE DETECTED NONE DETECTED   Cocaine NONE DETECTED NONE DETECTED   Benzodiazepines NONE DETECTED NONE DETECTED   Amphetamines NONE DETECTED NONE DETECTED   Tetrahydrocannabinol NONE DETECTED NONE DETECTED   Barbiturates (A) NONE DETECTED    Result not available. Reagent lot number recalled by manufacturer.    Comment: Performed at  [facility], [place] [address], [place], [place] [address]  Urinalysis, Complete w Microscopic     Status: Abnormal   Collection Time: 05/08/18  5:37 PM  Result Value Ref Range   Color, Urine YELLOW YELLOW   APPearance TURBID (A) CLEAR   Specific Gravity, Urine 1.026 1.005 - 1.030   pH 5.0 5.0 - 8.0   Glucose,  UA NEGATIVE NEGATIVE mg/dL   Hgb urine dipstick NEGATIVE NEGATIVE   Bilirubin Urine NEGATIVE NEGATIVE   Ketones, ur NEGATIVE NEGATIVE mg/dL   Protein, ur NEGATIVE NEGATIVE mg/dL   Nitrite NEGATIVE NEGATIVE   Leukocytes, UA NEGATIVE NEGATIVE   RBC / HPF 0-5 0 - 5 RBC/hpf   Bacteria, UA RARE (A) NONE SEEN   Squamous Epithelial / LPF 0-5 0 - 5   Mucus PRESENT    Amorphous Crystal PRESENT     Comment: Performed at [facility], [place] [address], [place], [place] [address]  Glucose, capillary     Status: Abnormal   Collection Time: 05/09/18  6:25 AM  Result Value Ref Range   Glucose-Capillary 108 (H) 70 - 99 mg/dL  Glucose, capillary     Status: Abnormal   Collection Time: 05/09/18 12:11 PM  Result Value Ref Range   Glucose-Capillary 133 (H) 70 - 99 mg/dL   Comment 1 Notify RN    Comment 2 Document in Chart   Glucose, capillary     Status: Abnormal   Collection Time: 05/09/18  5:04 PM  Result Value Ref Range   Glucose-Capillary 172 (H) 70 - 99 mg/dL   Comment 1 Notify RN    Comment 2 Document in Chart   Glucose, capillary     Status: Abnormal   Collection Time: 05/10/18  6:05 AM  Result Value Ref Range   Glucose-Capillary 104 (H) 70 - 99 mg/dL   Comment 1 Notify RN    Comment 2 Document in Chart     Blood Alcohol level:  Lab Results  Component Value Date   ETH <5 41/66/0630    Metabolic Disorder Labs: Lab Results  Component Value Date   HGBA1C 6.5 (H) 05/07/2018   MPG 139.85 05/07/2018   No results found for: PROLACTIN Lab Results  Component Value Date   CHOL 181 05/07/2018   TRIG 92 05/07/2018   HDL 46 05/07/2018    CHOLHDL 3.9 05/07/2018   VLDL 18 05/07/2018   LDLCALC 117 (H) 05/07/2018    Physical Findings: AIMS: Facial and Oral Movements Muscles of Facial Expression: None, normal Lips and Perioral Area: None, normal Jaw: None, normal Tongue: None, normal,Extremity Movements Upper (arms, wrists, hands, fingers): None, normal Lower (legs, knees, ankles, toes): None, normal, Trunk Movements Neck, shoulders, hips: None, normal, Overall Severity Severity of abnormal movements (highest score from questions above): None, normal Incapacitation due to abnormal movements: None, normal Patient's awareness of abnormal movements (rate only patient's report): No Awareness, Dental Status Current problems with teeth and/or dentures?: No Does patient usually wear dentures?: No  CIWA:    COWS:     Musculoskeletal: Strength & Muscle Tone: within normal limits Gait & Station: normal Patient leans: N/A  Psychiatric Specialty Exam: Physical Exam  Nursing note and vitals reviewed. Constitutional: She is oriented to person, place, and time. She appears well-developed and well-nourished.  HENT:  Head: Normocephalic and atraumatic.  Respiratory: Effort normal.  Neurological: She is alert and oriented to person, place, and time.    ROS  Blood pressure 93/69, pulse (!) 103, temperature 98.3 F (36.8 C), temperature source Oral, resp. rate 20, height 5' 6.5" (1.689 m), weight 91.6 kg (202 lb), SpO2 100 %.Body mass index is 32.12 kg/m.  General Appearance: Disheveled  Eye Contact:  Poor  Speech:  Slow  Volume:  Decreased  Mood:  Depressed  Affect:  Congruent  Thought Process:  Coherent  Orientation:  Full (Time, Place, and Person)  Thought Content:  Logical  Suicidal Thoughts:  No  Homicidal Thoughts:  No  Memory:  Immediate;   Fair Recent;   Fair Remote;   Fair  Judgement:  Impaired  Insight:  Fair  Psychomotor Activity:  Psychomotor Retardation  Concentration:  Concentration: Fair and Attention  Span: Fair  Recall:  [employer] of Knowledge:  Fair  Language:  Good  Akathisia:  Negative  Handed:  Right  AIMS (if indicated):     Assets:  Desire for Improvement Financial Resources/Insurance Housing Physical Health Resilience  ADL's:  Intact  Cognition:  WNL  Sleep:  Number of Hours: 6.75     Treatment Plan Summary: Daily contact with patient to assess and evaluate symptoms and progress in treatment, Medication management and Plan Patient is seen and examined.  Patient is a 22 year old female with the above-stated past psychiatric history seen in follow-up.  She continues to be significantly depressed.  She tolerated the protriptyline without any side effects by report.  I am going to increase her dosage to 10 mg p.o. 3 times daily.  She awaits ECT consultation, and hopefully as soon as that is done we can get her to a facility to have ECT and improve her depressive state.  Otherwise we will not change any the rest of her medications.  We will continue to monitor her blood sugars.  Sharma Covert, MD 05/10/2018, 9:45 AM

## 2018-05-10 NOTE — Progress Notes (Signed)
Adult Psychoeducational Group Note  Date:  05/10/2018 Time:  9:24 PM  Group Topic/Focus:  Wrap-Up Group:   The focus of this group is to help patients review their daily goal of treatment and discuss progress on daily workbooks.  Participation Level:  Active  Participation Quality:  Appropriate  Affect:  Appropriate  Cognitive:  Appropriate  Insight: Appropriate  Engagement in Group:  Engaged  Modes of Intervention:  Discussion  Additional Comments:  Patient attended group and participated.   Nicoles Sedlacek W Kento Gossman 05/10/2018, 9:24 PM

## 2018-05-11 LAB — GLUCOSE, CAPILLARY
Glucose-Capillary: 103 mg/dL — ABNORMAL HIGH (ref 70–99)
Glucose-Capillary: 117 mg/dL — ABNORMAL HIGH (ref 70–99)

## 2018-05-11 NOTE — Progress Notes (Signed)
[facility] MD Progress Note  05/11/2018 12:10 PM Renee Turner  MRN:  [id number] Subjective: Patient is seen and examined.  Patient is a 22 year old female with a past psychiatric history significant for major depression.  She is seen in follow-up.  She stated she is slightly better today.  She was out of her room in the day room today.  She stated she was tolerating the protriptyline.  She denied any side effects from that.  She still remains more grossly depressed.  Hopefully the ECT consult is going to be done tomorrow. Principal Problem: <principal problem not specified> Diagnosis:   Patient Active Problem List   Diagnosis Date Noted  . MDD (major depressive disorder), recurrent severe, without psychosis ([place]) [F33.2] 05/06/2018  . 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 Posttraumatic stress disorder [F43.10] 11/13/2016  . Severe episode of recurrent major depressive disorder, without psychotic features ([place]) [F33.2] 11/07/2016  . Diabetes ([place]) [E11.9] 11/07/2016  . Diabetic neuropathy associated with type 2 diabetes mellitus ([place]) [E11.40] 07/19/2016  . Obesity (BMI 30.0-34.9) [E66.9] 07/19/2016  . Type 2 diabetes mellitus with hyperglycemia, without long-term current use of insulin ([place]) [E11.65] 07/19/2016   Total Time spent with patient: 20 minutes  Past Psychiatric History: See admission H&P  Past Medical History:  Past Medical History:  Diagnosis Date  . Anxiety   . Depression   . Diabetes mellitus without complication ([place])    Type II  . Diabetes mellitus, type II ([place])    History reviewed. No pertinent surgical history. Family History: History reviewed. No pertinent family history. Family Psychiatric  History: See admission H&P Social History:  Social History   Substance and Sexual Activity  Alcohol Use Yes     Social History   Substance and Sexual Activity  Drug Use No    Social History   Socioeconomic History  . Marital status: Single    Spouse name:  Not on file  . Number of children: Not on file  . Years of education: Not on file  . Highest education level: Not on file  Occupational History  . Not on file  Social Needs  . Financial resource strain: Not on file  . Food insecurity:    Worry: Not on file    Inability: Not on file  . Transportation needs:    Medical: Not on file    Non-medical: Not on file  Tobacco Use  . Smoking status: Never Smoker  . Smokeless tobacco: Never Used  Substance and Sexual Activity  . Alcohol use: Yes  . Drug use: No  . Sexual activity: Yes    Birth control/protection: None  Lifestyle  . Physical activity:    Days per week: Not on file    Minutes per session: Not on file  . Stress: Not on file  Relationships  . Social connections:    Talks on phone: Not on file    Gets together: Not on file    Attends religious service: Not on file    Active member of club or organization: Not on file    Attends meetings of clubs or organizations: Not on file    Relationship status: Not on file  Other Topics Concern  . Not on file  Social History Narrative  . Not on file   Additional Social History:    Pain Medications: see MAR Prescriptions: see MAR Over the Counter: see MAR History of alcohol / drug use?: No history of alcohol / drug abuse  Sleep: Good  Appetite:  Fair  Current Medications: Current Facility-Administered Medications  Medication Dose Route Frequency Provider Last Rate Last Dose  . acetaminophen (TYLENOL) tablet 650 mg  650 mg Oral Q6H PRN Nanci Pina, FNP   650 mg at 05/09/18 2223  . alum & mag hydroxide-simeth (MAALOX/MYLANTA) 200-200-20 MG/5ML suspension 30 mL  30 mL Oral Q4H PRN Starkes, Takia S, FNP      . exenatide (BYETTA) immediate release injection  5 mcg Subcutaneous BID WC Sharma Covert, MD      . ferrous sulfate tablet 325 mg  325 mg Oral Q breakfast Sharma Covert, MD   325 mg at 05/11/18 0839  . FLUoxetine (PROZAC) capsule  40 mg  40 mg Oral Daily Sharma Covert, MD   40 mg at 05/11/18 [phone]  . gabapentin (NEURONTIN) capsule 300 mg  300 mg Oral TID Sharma Covert, MD   300 mg at 05/11/18 0839  . hydrOXYzine (ATARAX/VISTARIL) tablet 25 mg  25 mg Oral TID PRN Nanci Pina, FNP   25 mg at 05/09/18 2221  . magnesium hydroxide (MILK OF MAGNESIA) suspension 30 mL  30 mL Oral Daily PRN Lavina Hamman, Takia S, FNP      . metFORMIN (GLUCOPHAGE-XR) 24 hr tablet 1,000 mg  1,000 mg Oral BID WC Sharma Covert, MD   1,000 mg at 05/11/18 2202  . protriptyline (VIVACTIL) tablet 10 mg  10 mg Oral TID Sharma Covert, MD   10 mg at 05/11/18 0839  . traZODone (DESYREL) tablet 50 mg  50 mg Oral QHS PRN Sharma Covert, MD   50 mg at 05/10/18 2120    Lab Results:  Results for orders placed or performed during the hospital encounter of 05/06/18 (from the past 48 hour(s))  Glucose, capillary     Status: Abnormal   Collection Time: 05/09/18 12:11 PM  Result Value Ref Range   Glucose-Capillary 133 (H) 70 - 99 mg/dL   Comment 1 Notify RN    Comment 2 Document in Chart   Glucose, capillary     Status: Abnormal   Collection Time: 05/09/18  5:04 PM  Result Value Ref Range   Glucose-Capillary 172 (H) 70 - 99 mg/dL   Comment 1 Notify RN    Comment 2 Document in Chart   Glucose, capillary     Status: Abnormal   Collection Time: 05/10/18  6:05 AM  Result Value Ref Range   Glucose-Capillary 104 (H) 70 - 99 mg/dL   Comment 1 Notify RN    Comment 2 Document in Chart   Glucose, capillary     Status: Abnormal   Collection Time: 05/10/18 12:05 PM  Result Value Ref Range   Glucose-Capillary 135 (H) 70 - 99 mg/dL   Comment 1 Notify RN    Comment 2 Document in Chart   Glucose, capillary     Status: Abnormal   Collection Time: 05/10/18  5:03 PM  Result Value Ref Range   Glucose-Capillary 122 (H) 70 - 99 mg/dL   Comment 1 Notify RN    Comment 2 Document in Chart   Glucose, capillary     Status: Abnormal   Collection Time:  05/11/18  6:27 AM  Result Value Ref Range   Glucose-Capillary 103 (H) 70 - 99 mg/dL    Blood Alcohol level:  Lab Results  Component Value Date   ETH <5 54/27/0623    Metabolic Disorder Labs: Lab Results  Component Value Date  HGBA1C 6.5 (H) 05/07/2018   MPG 139.85 05/07/2018   No results found for: PROLACTIN Lab Results  Component Value Date   CHOL 181 05/07/2018   TRIG 92 05/07/2018   HDL 46 05/07/2018   CHOLHDL 3.9 05/07/2018   VLDL 18 05/07/2018   LDLCALC 117 (H) 05/07/2018    Physical Findings: AIMS: Facial and Oral Movements Muscles of Facial Expression: None, normal Lips and Perioral Area: None, normal Jaw: None, normal Tongue: None, normal,Extremity Movements Upper (arms, wrists, hands, fingers): None, normal Lower (legs, knees, ankles, toes): None, normal, Trunk Movements Neck, shoulders, hips: None, normal, Overall Severity Severity of abnormal movements (highest score from questions above): None, normal Incapacitation due to abnormal movements: None, normal Patient's awareness of abnormal movements (rate only patient's report): No Awareness, Dental Status Current problems with teeth and/or dentures?: No Does patient usually wear dentures?: No  CIWA:  CIWA-Ar Total: 0 COWS:  COWS Total Score: 0  Musculoskeletal: Strength & Muscle Tone: within normal limits Gait & Station: normal Patient leans: N/A  Psychiatric Specialty Exam: Physical Exam  Nursing note and vitals reviewed. Constitutional: She is oriented to person, place, and time. She appears well-developed and well-nourished.  HENT:  Head: Normocephalic and atraumatic.  Respiratory: Effort normal.  Neurological: She is alert and oriented to person, place, and time.    ROS  Blood pressure 93/69, pulse (!) 103, temperature 98.3 F (36.8 C), temperature source Oral, resp. rate 20, height 5' 6.5" (1.689 m), weight 91.6 kg (202 lb), SpO2 100 %.Body mass index is 32.12 kg/m.  General Appearance:  Casual  Eye Contact:  Fair  Speech:  Slow  Volume:  Decreased  Mood:  Depressed  Affect:  Congruent  Thought Process:  Coherent  Orientation:  Full (Time, Place, and Person)  Thought Content:  Logical  Suicidal Thoughts:  Yes.  without intent/plan  Homicidal Thoughts:  No  Memory:  Immediate;   Fair Recent;   Fair Remote;   Fair  Judgement:  Intact  Insight:  Fair  Psychomotor Activity:  Psychomotor Retardation  Concentration:  Concentration: Fair and Attention Span: Fair  Recall:  [employer] of Knowledge:  Fair  Language:  Good  Akathisia:  Negative  Handed:  Right  AIMS (if indicated):     Assets:  Desire for Improvement Financial Resources/Insurance Housing Resilience Social Support  ADL's:  Intact  Cognition:  WNL  Sleep:  Number of Hours: 6     Treatment Plan Summary: Daily contact with patient to assess and evaluate symptoms and progress in treatment, Medication management and Plan Patient is seen and examined.  Patient's 22 year old female with the above-stated past psychiatric history seen in follow-up.  She remains significantly depressed.  She is tolerating the protriptyline with the Prozac.  She did have some mild tachycardia this morning, but her blood pressure remained stable.  Hopefully the ECT consult will take place tomorrow.  Hopefully will be able to transfer to  and begin treatment with ECT.  No changes in her current medications.  Sharma Covert, MD 05/11/2018, 12:10 PM

## 2018-05-11 NOTE — Progress Notes (Signed)
Patient ID: Renee Turner, female   DOB: 09/30/1996, 22 y.o.   MRN: [id number] DAR Note: Pt observed in the dayroom not interacting. Pt endorsed moderate depression and anxiety; "I still feel depressed, there haven't been much difference since I came." Pt denied pain, SI/HI or AVH. Pt and nurse discussed ways including distraction to help with both anxiety and depression. Pt verbalized understanding education with nurse. The goal for tomorrow it to practice one of the coping skills discussed with the RN. All patient's questions and concerns addressed. Support, encouragement, and safe environment provided. 15-minute safety checks continue.   

## 2018-05-11 NOTE — Progress Notes (Signed)
D: Patient is depressed, sad, cautious. She forwards minimal information. Patient slept well last night, and received medication that helped. Appetite is good, energy normal and concentration good. She rates her depression 5/10, sense of hopelessness 2/10 and anxiety 2/10. She denies SI/HI/AVH. She denies physical pain on assessment, but reports pain 2/10 on self inventory. She did not specify the part of her body that was hurting.  A: Patient checked q15 min, and checks reviewed. Reviewed medication changes with patient and educated on side effects. Educated patient on importance of attending group therapy sessions and educated on several coping skills. Encouarged participation in milieu through recreation therapy and attending meals with peers. Support and encouragement provided. Fluids offered. R: Patient receptive to education on medications, and is medication compliant. Patient contracts for safety on the unit.

## 2018-05-11 NOTE — Progress Notes (Signed)
Adult Psychoeducational Group Note  Date:  05/11/2018 Time:  10:39 PM  Group Topic/Focus:  Wrap-Up Group:   The focus of this group is to help patients review their daily goal of treatment and discuss progress on daily workbooks.  Participation Level:  Active  Participation Quality:  Appropriate  Affect:  Appropriate  Cognitive:  Appropriate  Insight: Appropriate  Engagement in Group:  Engaged  Modes of Intervention:  Discussion  Additional Comments:  Pt rated the day at a 7/10 due to staying out of her room and talking more to others.  Mirra Basilio 05/11/2018, 10:39 PM

## 2018-05-11 NOTE — Plan of Care (Signed)
Patient reports her sleep was good last night. She continues to experience depression, rated 5/10. Her goal today is "staying out of my room" and to meet that "spending time with my peers."

## 2018-05-11 NOTE — [facility] Group Notes (Signed)
[facility] LCSW Group Therapy Note  Date/Time:  05/11/2018  10:00-11:00AM  Type of Therapy and Topic:  Group Therapy:  Healthy and Unhealthy Supports  Participation Level:  Did Not Attend   Description of Group:  Patients in this group were introduced to the idea of adding a variety of healthy supports to address the various needs in their lives.Patients discussed what additional healthy supports could be helpful in their recovery and wellness after discharge in order to prevent future hospitalizations.   An emphasis was placed on using counselor, doctor, therapy groups, 12-step groups, and problem-specific support groups to expand supports.  They also worked as a group on developing a specific plan for several patients to deal with unhealthy supports through boundary-setting, psychoeducation with loved ones, and even termination of relationships.   Therapeutic Goals:   1)  discuss importance of adding supports to stay well once out of the hospital  2)  compare healthy versus unhealthy supports and identify some examples of each  3)  generate ideas and descriptions of healthy supports that can be added  4)  offer mutual support about how to address unhealthy supports  5)  encourage active participation in and adherence to discharge plan    Summary of Patient Progress:  The patient did not attend group. Therapeutic Modalities:   Motivational Interviewing Brief Solution-Focused Therapy  Jhalen Eley D Marquasha Brutus         

## 2018-05-11 NOTE — Progress Notes (Signed)
Adult Psychoeducational Group Note  Date:  05/11/2018 Time:  10:26 AM  Group Topic/Focus:  Healthy Support Systems  Participation Level:  None  Participation Quality:  Inattentive  Affect:  Flat  Cognitive:  Appropriate  Insight: None  Engagement in Group:  None  Modes of Intervention:  Activity and Discussion  Additional Comments:  Pt was engaged and appropriate in group. Pt received a handout of "The 5 Love Languages" quiz. Pt completed quiz and discussed results with group. Pt was not engaged in group, and left group early.   Lucianne LeiHaley M Daryl Beehler 05/11/2018, 10:26 AM

## 2018-05-12 LAB — GLUCOSE, CAPILLARY
GLUCOSE-CAPILLARY: 94 mg/dL (ref 70–99)
Glucose-Capillary: 92 mg/dL (ref 70–99)
Glucose-Capillary: 137 mg/dL — ABNORMAL HIGH (ref 70–99)

## 2018-05-12 NOTE — Progress Notes (Signed)
At request of Dr. Jola Babinskilary, [facility] [phone] to request that Dr. Toni Amendlapacs review pt for ECT appropriateness.   Kealey Kemmer S. Alan RipperHolloway, MSW, LCSW Clinical Social Worker 05/12/2018 10:29 AM

## 2018-05-12 NOTE — Progress Notes (Signed)
Recreation Therapy Notes  Date: 7.8.19 Time: 0930 Location: 300 Hall Dayroom  Group Topic: Stress Management  Goal Area(s) Addresses:  Patient will verbalize importance of using healthy stress management.  Patient will identify positive emotions associated with healthy stress management.   Intervention: Stress Management  Activity :  Meditation.  LRT introduced the stress management technique of meditation.  LRT played a meditation on choice.  Patients were to listen and follow along as meditation played.  Education:  Stress Management, Discharge Planning.   Education Outcome: Acknowledges edcuation/In group clarification offered/Needs additional education  Clinical Observations/Feedback:  Pt did not attend group.     Caroll RancherMarjette Valjean Ruppel, LRT/CTRS         Lillia AbedLindsay, Hogan Hoobler A 05/12/2018 11:58 AM

## 2018-05-12 NOTE — Progress Notes (Signed)
Pt presents with a flat affect and depressed mood. Pt reports decreased depression today but appears severely depressed. Pt noted to be minimal, guarded and cautious during shift assessment. Pt forwarded little information with a soft tone. Pt appears withdrawn and is isolative to her room. Pt requires encouragement from staff to attend scheduled groups and meals. Pt denies SI/HI. Pt denies any side effects to meds.    Orders reviewed with pt. Verbal support provided. V/s and labs reviewed. Suicide risk assessment completed. Pt encouraged to attend scheduled groups. 15 minute checks performed for safety.   Pt compliant with tx plan.

## 2018-05-12 NOTE — Progress Notes (Signed)
[facility] MD Progress Note  05/12/2018 1:22 PM Renee Turner  MRN:  [id number] Subjective: Patient is seen and examined.  Patient is a 22 year old female with a past psychiatric history significant for major depression.  She is seen in follow-up.  She is able to speak a little bit more today.  She was out of her room in the day room a lot today.  She stated she is tolerating the protriptyline without difficulty.  She denied any current side effects to that.  She still remains grossly depressed.  ECT consult is still pending. Principal Problem: <principal problem not specified> Diagnosis:   Patient Active Problem List   Diagnosis Date Noted  . MDD (major depressive disorder), recurrent severe, without psychosis ([place]) [F33.2] 05/06/2018  . 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 Posttraumatic stress disorder [F43.10] 11/13/2016  . Severe episode of recurrent major depressive disorder, without psychotic features ([place]) [F33.2] 11/07/2016  . Diabetes ([place]) [E11.9] 11/07/2016  . Diabetic neuropathy associated with type 2 diabetes mellitus ([place]) [E11.40] 07/19/2016  . Obesity (BMI 30.0-34.9) [E66.9] 07/19/2016  . Type 2 diabetes mellitus with hyperglycemia, without long-term current use of insulin ([place]) [E11.65] 07/19/2016   Total Time spent with patient: 20 minutes  Past Psychiatric History: See admission H&P  Past Medical History:  Past Medical History:  Diagnosis Date  . Anxiety   . Depression   . Diabetes mellitus without complication ([place])    Type II  . Diabetes mellitus, type II ([place])    History reviewed. No pertinent surgical history. Family History: History reviewed. No pertinent family history. Family Psychiatric  History: See admission H&P Social History:  Social History   Substance and Sexual Activity  Alcohol Use Yes     Social History   Substance and Sexual Activity  Drug Use No    Social History   Socioeconomic History  . Marital status: Single    Spouse name:  Not on file  . Number of children: Not on file  . Years of education: Not on file  . Highest education level: Not on file  Occupational History  . Not on file  Social Needs  . Financial resource strain: Not on file  . Food insecurity:    Worry: Not on file    Inability: Not on file  . Transportation needs:    Medical: Not on file    Non-medical: Not on file  Tobacco Use  . Smoking status: Never Smoker  . Smokeless tobacco: Never Used  Substance and Sexual Activity  . Alcohol use: Yes  . Drug use: No  . Sexual activity: Yes    Birth control/protection: None  Lifestyle  . Physical activity:    Days per week: Not on file    Minutes per session: Not on file  . Stress: Not on file  Relationships  . Social connections:    Talks on phone: Not on file    Gets together: Not on file    Attends religious service: Not on file    Active member of club or organization: Not on file    Attends meetings of clubs or organizations: Not on file    Relationship status: Not on file  Other Topics Concern  . Not on file  Social History Narrative  . Not on file   Additional Social History:    Pain Medications: see MAR Prescriptions: see MAR Over the Counter: see MAR History of alcohol / drug use?: No history of alcohol / drug abuse  Sleep: Good  Appetite:  Fair  Current Medications: Current Facility-Administered Medications  Medication Dose Route Frequency Provider Last Rate Last Dose  . acetaminophen (TYLENOL) tablet 650 mg  650 mg Oral Q6H PRN Nanci Pina, FNP   650 mg at 05/09/18 2223  . alum & mag hydroxide-simeth (MAALOX/MYLANTA) 200-200-20 MG/5ML suspension 30 mL  30 mL Oral Q4H PRN Starkes, Takia S, FNP      . exenatide (BYETTA) immediate release injection  5 mcg Subcutaneous BID WC Sharma Covert, MD      . ferrous sulfate tablet 325 mg  325 mg Oral Q breakfast Sharma Covert, MD   325 mg at 05/12/18 0813  . FLUoxetine (PROZAC) capsule  40 mg  40 mg Oral Daily Sharma Covert, MD   40 mg at 05/12/18 0813  . gabapentin (NEURONTIN) capsule 300 mg  300 mg Oral TID Sharma Covert, MD   300 mg at 05/12/18 1207  . hydrOXYzine (ATARAX/VISTARIL) tablet 25 mg  25 mg Oral TID PRN Nanci Pina, FNP   25 mg at 05/09/18 2221  . magnesium hydroxide (MILK OF MAGNESIA) suspension 30 mL  30 mL Oral Daily PRN Lavina Hamman, Takia S, FNP      . metFORMIN (GLUCOPHAGE-XR) 24 hr tablet 1,000 mg  1,000 mg Oral BID WC Sharma Covert, MD   1,000 mg at 05/12/18 0813  . protriptyline (VIVACTIL) tablet 10 mg  10 mg Oral TID Sharma Covert, MD   10 mg at 05/12/18 1206  . traZODone (DESYREL) tablet 50 mg  50 mg Oral QHS PRN Sharma Covert, MD   50 mg at 05/11/18 2210    Lab Results:  Results for orders placed or performed during the hospital encounter of 05/06/18 (from the past 48 hour(s))  Glucose, capillary     Status: Abnormal   Collection Time: 05/10/18  5:03 PM  Result Value Ref Range   Glucose-Capillary 122 (H) 70 - 99 mg/dL   Comment 1 Notify RN    Comment 2 Document in Chart   Glucose, capillary     Status: Abnormal   Collection Time: 05/11/18  6:27 AM  Result Value Ref Range   Glucose-Capillary 103 (H) 70 - 99 mg/dL  Glucose, capillary     Status: Abnormal   Collection Time: 05/11/18  5:47 PM  Result Value Ref Range   Glucose-Capillary 117 (H) 70 - 99 mg/dL  Glucose, capillary     Status: None   Collection Time: 05/12/18  5:43 AM  Result Value Ref Range   Glucose-Capillary 94 70 - 99 mg/dL   Comment 1 Notify RN    Comment 2 Document in Chart   Glucose, capillary     Status: None   Collection Time: 05/12/18 12:04 PM  Result Value Ref Range   Glucose-Capillary 92 70 - 99 mg/dL   Comment 1 Notify RN    Comment 2 Document in Chart     Blood Alcohol level:  Lab Results  Component Value Date   ETH <5 34/91/7915    Metabolic Disorder Labs: Lab Results  Component Value Date   HGBA1C 6.5 (H) 05/07/2018   MPG  139.85 05/07/2018   No results found for: PROLACTIN Lab Results  Component Value Date   CHOL 181 05/07/2018   TRIG 92 05/07/2018   HDL 46 05/07/2018   CHOLHDL 3.9 05/07/2018   VLDL 18 05/07/2018   LDLCALC 117 (H) 05/07/2018    Physical Findings: AIMS: Facial and Oral  Movements Muscles of Facial Expression: None, normal Lips and Perioral Area: None, normal Jaw: None, normal Tongue: None, normal,Extremity Movements Upper (arms, wrists, hands, fingers): None, normal Lower (legs, knees, ankles, toes): None, normal, Trunk Movements Neck, shoulders, hips: None, normal, Overall Severity Severity of abnormal movements (highest score from questions above): None, normal Incapacitation due to abnormal movements: None, normal Patient's awareness of abnormal movements (rate only patient's report): No Awareness, Dental Status Current problems with teeth and/or dentures?: No Does patient usually wear dentures?: No  CIWA:  CIWA-Ar Total: 0 COWS:  COWS Total Score: 0  Musculoskeletal: Strength & Muscle Tone: within normal limits Gait & Station: normal Patient leans: N/A  Psychiatric Specialty Exam: Physical Exam  Nursing note and vitals reviewed. Constitutional: She is oriented to person, place, and time. She appears well-developed and well-nourished.  HENT:  Head: Normocephalic and atraumatic.  Respiratory: Effort normal.  Neurological: She is alert and oriented to person, place, and time.    ROS  Blood pressure (!) 92/55, pulse (!) 109, temperature 98.7 F (37.1 C), temperature source Oral, resp. rate 20, height 5' 6.5" (1.689 m), weight 91.6 kg (202 lb), SpO2 100 %.Body mass index is 32.12 kg/m.  General Appearance: Casual  Eye Contact:  Fair  Speech:  Slow  Volume:  Decreased  Mood:  Depressed  Affect:  Congruent  Thought Process:  Coherent  Orientation:  Full (Time, Place, and Person)  Thought Content:  Logical  Suicidal Thoughts:  Yes.  without intent/plan  Homicidal  Thoughts:  No  Memory:  Immediate;   Fair Recent;   Fair Remote;   Fair  Judgement:  Intact  Insight:  Fair  Psychomotor Activity:  Psychomotor Retardation  Concentration:  Concentration: Fair and Attention Span: Fair  Recall:  [employer] of Knowledge:  Fair  Language:  Fair  Akathisia:  Negative  Handed:  Right  AIMS (if indicated):     Assets:  Desire for Improvement Housing Physical Health Resilience Social Support  ADL's:  Intact  Cognition:  WNL  Sleep:  Number of Hours: 6.75     Treatment Plan Summary: Daily contact with patient to assess and evaluate symptoms and progress in treatment, Medication management and Plan Patient is seen and examined.  Patient is a 22 year old female with the above-stated past psychiatric history who is seen in follow-up.  She continues to be depressed, but with some slight improvement with the addition of protriptyline.  ECT consult is pending.  Hopefully that will be done today.  No changes in current medications, with I think as soon as we find out about ECT will be able to move forward more rapidly .  Sharma Covert, MD 05/12/2018, 1:22 PM

## 2018-05-12 NOTE — Consult Note (Signed)
ECT: Chart reviewed.  22 year old woman with severe depressive episode.  Recommended for consideration for ECT.  Based on diagnosis patient is a reasonable candidate.  No clear contraindications.  If patient is agreeable to ECT we would be fine to accept her in transfer for consideration of ECT treatment.  Please contact TTS for transfer at the earliest opportunity.  Thanks.

## 2018-05-12 NOTE — Progress Notes (Signed)
Patient ID: Renee Turner, female   DOB: October 01, 1996, 22 y.o.   MRN: [id number] D: Patient observed watching TV and interacting well with peers on approach. Pt is soft spoken and forwarded little. Pt reports is tolerating medication well. Pt attended evening wrap up group and engaged in discussion. Denies  SI/HI/AVH and pain.No behavioral issues noted.  A: Support and encouragement offered as needed to express needs. Medications administered as prescribed.  R: Patient is safe and cooperative on unit. Will continue to monitor  for safety and stability.

## 2018-05-12 NOTE — Tx Team (Signed)
Interdisciplinary Treatment and Diagnostic Plan Update  05/12/2018 Time of Session: 9:30am Renee Turner MRN: [id number]  Principal Diagnosis: <principal problem not specified>  Secondary Diagnoses: Active Problems:   MDD (major depressive disorder), recurrent severe, without psychosis ([place])   Current Medications:  Current Facility-Administered Medications  Medication Dose Route Frequency Provider Last Rate Last Dose  . acetaminophen (TYLENOL) tablet 650 mg  650 mg Oral Q6H PRN Nanci Pina, FNP   650 mg at 05/09/18 2223  . alum & mag hydroxide-simeth (MAALOX/MYLANTA) 200-200-20 MG/5ML suspension 30 mL  30 mL Oral Q4H PRN Starkes, Takia S, FNP      . exenatide (BYETTA) immediate release injection  5 mcg Subcutaneous BID WC Sharma Covert, MD      . ferrous sulfate tablet 325 mg  325 mg Oral Q breakfast Sharma Covert, MD   325 mg at 05/12/18 0813  . FLUoxetine (PROZAC) capsule 40 mg  40 mg Oral Daily Sharma Covert, MD   40 mg at 05/12/18 0813  . gabapentin (NEURONTIN) capsule 300 mg  300 mg Oral TID Sharma Covert, MD   300 mg at 05/12/18 1207  . hydrOXYzine (ATARAX/VISTARIL) tablet 25 mg  25 mg Oral TID PRN Nanci Pina, FNP   25 mg at 05/09/18 2221  . magnesium hydroxide (MILK OF MAGNESIA) suspension 30 mL  30 mL Oral Daily PRN Lavina Hamman, Takia S, FNP      . metFORMIN (GLUCOPHAGE-XR) 24 hr tablet 1,000 mg  1,000 mg Oral BID WC Sharma Covert, MD   1,000 mg at 05/12/18 0813  . protriptyline (VIVACTIL) tablet 10 mg  10 mg Oral TID Sharma Covert, MD   10 mg at 05/12/18 1206  . traZODone (DESYREL) tablet 50 mg  50 mg Oral QHS PRN Sharma Covert, MD   50 mg at 05/11/18 2210   PTA Medications: Medications Prior to Admission  Medication Sig Dispense Refill Last Dose  . dapagliflozin propanediol (FARXIGA) 10 MG TABS tablet Take 10 mg by mouth.   Not Taking at Unknown time  . docusate sodium (COLACE) 100 MG capsule Take 100 mg by mouth.   Not Taking at Unknown  time  . Exenatide ER (BYDUREON) 2 MG PEN Inject 2 mg into the skin once a week.   Not Taking at Unknown time  . Ferrous Sulfate (IRON) 325 (65 Fe) MG TABS Take 325 mg by mouth daily.  3 Not Taking at Unknown time  . FLUoxetine (PROZAC) 40 MG capsule Take 1 capsule (40 mg total) by mouth daily. (Patient not taking: Reported on 05/06/2018) 90 capsule 0 Not Taking at Unknown time  . gabapentin (NEURONTIN) 300 MG capsule Take 1 capsule by mouth 3 (three) times daily.  2 Not Taking at Unknown time  . metFORMIN (GLUCOPHAGE-XR) 500 MG 24 hr tablet Take 1,000 mg by mouth 2 (two) times daily.  3 Not Taking at Unknown time  . metoprolol succinate (TOPROL-XL) 25 MG 24 hr tablet Take by mouth.   Not Taking at Unknown time  . traZODone (DESYREL) 50 MG tablet Take 1 tablet (50 mg total) by mouth at bedtime. Take 2 hours before bedtime (Patient not taking: Reported on 05/06/2018) 90 tablet 1 Not Taking at Unknown time    Patient Stressors: Medication change or noncompliance Traumatic event  Patient Strengths: Ability for insight Average or above average intelligence Capable of independent living General fund of knowledge Supportive family/friends  Treatment Modalities: Medication Management, Group therapy, Case management,  1  to 1 session with clinician, Psychoeducation, Recreational therapy.   Physician Treatment Plan for Primary Diagnosis: <principal problem not specified> Long Term Goal(s): Improvement in symptoms so as ready for discharge Improvement in symptoms so as ready for discharge   Short Term Goals: Ability to identify changes in lifestyle to reduce recurrence of condition will improve Ability to verbalize feelings will improve Ability to disclose and discuss suicidal ideas Ability to demonstrate self-control will improve Ability to identify and develop effective coping behaviors will improve Ability to maintain clinical measurements within normal limits will improve Compliance with  prescribed medications will improve Ability to identify changes in lifestyle to reduce recurrence of condition will improve Ability to verbalize feelings will improve Ability to disclose and discuss suicidal ideas Ability to demonstrate self-control will improve Ability to identify and develop effective coping behaviors will improve Ability to maintain clinical measurements within normal limits will improve Compliance with prescribed medications will improve  Medication Management: Evaluate patient's response, side effects, and tolerance of medication regimen.  Therapeutic Interventions: 1 to 1 sessions, Unit Group sessions and Medication administration.  Evaluation of Outcomes: Progressing  Physician Treatment Plan for Secondary Diagnosis: Active Problems:   MDD (major depressive disorder), recurrent severe, without psychosis ([place])  Long Term Goal(s): Improvement in symptoms so as ready for discharge Improvement in symptoms so as ready for discharge   Short Term Goals: Ability to identify changes in lifestyle to reduce recurrence of condition will improve Ability to verbalize feelings will improve Ability to disclose and discuss suicidal ideas Ability to demonstrate self-control will improve Ability to identify and develop effective coping behaviors will improve Ability to maintain clinical measurements within normal limits will improve Compliance with prescribed medications will improve Ability to identify changes in lifestyle to reduce recurrence of condition will improve Ability to verbalize feelings will improve Ability to disclose and discuss suicidal ideas Ability to demonstrate self-control will improve Ability to identify and develop effective coping behaviors will improve Ability to maintain clinical measurements within normal limits will improve Compliance with prescribed medications will improve     Medication Management: Evaluate patient's response, side effects, and  tolerance of medication regimen.  Therapeutic Interventions: 1 to 1 sessions, Unit Group sessions and Medication administration.  Evaluation of Outcomes: Progressing   RN Treatment Plan for Primary Diagnosis: <principal problem not specified> Long Term Goal(s): Knowledge of disease and therapeutic regimen to maintain health will improve  Short Term Goals: Ability to disclose and discuss suicidal ideas, Ability to identify and develop effective coping behaviors will improve and Compliance with prescribed medications will improve  Medication Management: RN will administer medications as ordered by provider, will assess and evaluate patient's response and provide education to patient for prescribed medication. RN will report any adverse and/or side effects to prescribing provider.  Therapeutic Interventions: 1 on 1 counseling sessions, Psychoeducation, Medication administration, Evaluate responses to treatment, Monitor vital signs and CBGs as ordered, Perform/monitor CIWA, COWS, AIMS and Fall Risk screenings as ordered, Perform wound care treatments as ordered.  Evaluation of Outcomes: Progressing  LCSW Treatment Plan for Primary Diagnosis: <principal problem not specified> Long Term Goal(s): Safe transition to appropriate next level of care at discharge, Engage patient in therapeutic group addressing interpersonal concerns.  Short Term Goals: Engage patient in aftercare planning with referrals and resources  Therapeutic Interventions: Assess for all discharge needs, 1 to 1 time with Social worker, Explore available resources and support systems, Assess for adequacy in community support network, Educate family and significant other(s) on  suicide prevention, Complete Psychosocial Assessment, Interpersonal group therapy.  Evaluation of Outcomes: Progressing   Progress in Treatment: Attending groups: Intermittently  Participating in groups: Minimally, when she attends.  Taking medication as  prescribed: Yes. Toleration medication: Yes. Family/Significant other contact made: SPE completed with pt; pt declined to consent to collateral contact.  Patient understands diagnosis: Yes. Discussing patient identified problems/goals with staff: Yes. Medical problems stabilized or resolved: Yes. Denies suicidal/homicidal ideation: Yes. Issues/concerns per patient self-inventory: No. Other:   New problem(s) identified: None   New Short Term/Long Term Goal(s):medication stabilization, elimination of SI thoughts, development of comprehensive mental wellness plan.    Patient Goals:  "coping skills and get ECT"  Discharge Plan or Barriers: CSW referred pt to [facility]/Dr. Weber Cooks to review for potential ECT.   Reason for Continuation of Hospitalization: Depression Medication stabilization Suicidal ideation  Estimated Length of Stay: Thursday, 05/15/18  Attendees: Patient:  05/12/2018 1:53 PM  Physician: Dr. Myles Lipps, MD 05/12/2018 1:53 PM  Nursing: Sharl Ma RN; Chrys Racer RN 05/12/2018 1:53 PM  RN Care Manager: Rhunette Croft 05/12/2018 1:53 PM  Social Worker: Janice Norrie LCSW 05/12/2018 1:53 PM  Recreational Therapist: Rhunette Croft 05/12/2018 1:53 PM  Other: Ricky Ala NP; Saul Fordyce NP 05/12/2018 1:53 PM  Other: X 05/12/2018 1:53 PM  Other:X 05/12/2018 1:53 PM    Scribe for Treatment Team: Avelina Laine, LCSW 05/12/2018 1:53 PM

## 2018-05-13 ENCOUNTER — Encounter (HOSPITAL_COMMUNITY): Payer: Self-pay | Admitting: Behavioral Health

## 2018-05-13 ENCOUNTER — Inpatient Hospital Stay: Admission: AD | Admit: 2018-05-13 | Payer: BLUE CROSS/BLUE SHIELD | Source: Intra-hospital | Admitting: Psychiatry

## 2018-05-13 DIAGNOSIS — Z79899 Other long term (current) drug therapy: Secondary | ICD-10-CM

## 2018-05-13 LAB — GLUCOSE, CAPILLARY
GLUCOSE-CAPILLARY: 115 mg/dL — AB (ref 70–99)
Glucose-Capillary: 101 mg/dL — ABNORMAL HIGH (ref 70–99)
Glucose-Capillary: 103 mg/dL — ABNORMAL HIGH (ref 70–99)

## 2018-05-13 MED ORDER — GABAPENTIN 300 MG PO CAPS: 300.0000 mg | ORAL_CAPSULE | Freq: Three times a day (TID) | ORAL | 0 refills | Status: DC

## 2018-05-13 MED ORDER — METFORMIN HCL ER (OSM) 1000 MG PO TB24: 1000.0000 mg | ORAL_TABLET | Freq: Two times a day (BID) | ORAL | Status: DC

## 2018-05-13 MED ORDER — FERROUS SULFATE 325 (65 FE) MG PO TABS: 325.0000 mg | ORAL_TABLET | Freq: Every day | ORAL | 0 refills | Status: DC

## 2018-05-13 MED ORDER — HYDROXYZINE HCL 25 MG PO TABS: 25.0000 mg | ORAL_TABLET | Freq: Three times a day (TID) | ORAL | 0 refills | Status: DC | PRN

## 2018-05-13 MED ORDER — EXENATIDE 5 MCG/0.02ML ~~LOC~~ SOPN: 5.0000 ug | PEN_INJECTOR | Freq: Two times a day (BID) | SUBCUTANEOUS | Status: DC

## 2018-05-13 MED ORDER — FLUOXETINE HCL 40 MG PO CAPS: 40.0000 mg | ORAL_CAPSULE | Freq: Every day | ORAL | 0 refills | Status: DC

## 2018-05-13 MED ORDER — TRAZODONE HCL 50 MG PO TABS: 50.0000 mg | ORAL_TABLET | Freq: Every evening | ORAL | Status: DC | PRN

## 2018-05-13 MED ORDER — PROTRIPTYLINE HCL 10 MG PO TABS: 10.0000 mg | ORAL_TABLET | Freq: Three times a day (TID) | ORAL | Status: DC

## 2018-05-13 MED ORDER — TRAZODONE HCL 50 MG PO TABS: 50.0000 mg | ORAL_TABLET | Freq: Every day | ORAL | 0 refills | Status: DC

## 2018-05-13 NOTE — Progress Notes (Signed)
DAR NOTE: Patient presents with calm affect and pleasant mood.  Pt appeared guarded on approach, but brightened up as the Clinical research associatewriter talked with her. Pt stated she slept well, good appetite, normal energy, and good concentration. Denies pain, auditory and visual hallucinations.  Rates depression at 6, hopelessness at 6, and anxiety at 4.  Maintained on routine safety checks.  Medications given as prescribed.  Support and encouragement offered as needed.  Attended group and participated.  States goal for today is " is to meet with Dr Florentina Addisonlaypax." Will continue to monitor.

## 2018-05-13 NOTE — Plan of Care (Signed)
Patient verbalizes understanding of information, education provided. 

## 2018-05-13 NOTE — Consult Note (Signed)
ECT: I am informed by our triage specialist team today that the preference is to transfer the patient tomorrow, Wednesday the 10th.  I will go ahead and put tentative admission orders in as pended orders and then see her when she gets here.

## 2018-05-13 NOTE — Progress Notes (Signed)
Pt originally scheduled to be transferred to [facility] for ECT. This was later cancel as per Dr Jama Flavorsobos.

## 2018-05-13 NOTE — Progress Notes (Signed)
CSW spoke to [place], TTS at [facility].  Pt is accepted by Dr Toni Amendlapacs and can come today at 2pm.  They are requesting a new voluntary admission form signed today prior to transfer to be faxed to [phone]. Garner NashGregory Myrlene Riera, MSW, LCSW Clinical Social Worker 05/13/2018 11:59 AM

## 2018-05-13 NOTE — Progress Notes (Signed)
Per Dr Jama Flavorsobos request, transfer to [facility] was delayed until tomorrow.  CSW spoke to [place], TTS at [facility] regarding delay.  He asked that we recontact them tomorrow to arrange time of tranfer. Garner NashGregory Nikoletta Varma, MSW, LCSW Clinical Social Worker 05/13/2018 1:20 PM

## 2018-05-13 NOTE — Progress Notes (Signed)
D: Patient observed resting in bed. Approached by this RN for assessment. Patient very soft spoken. Agreed to come up to med window with this Clinical research associatewriter as she indicated she would like something to help her sleep. Patient states her depression is "about a 6-7/10." Patient's affect flat, sad and depressed. Mood congruent. Patient did brighten slightly as rapport was established.  Denies pain, physical complaints.   A: Medicated per prn orders with trazadone as patient states it has been helpful before. Medication education provided. Level III obs in place for safety. Emotional support offered. Patient encouraged to complete Suicide Safety Plan before discharge. Encouraged to attend and participate in unit programming.  R: Patient verbalizes understanding of POC, reiterates her interest in ECT as "the TMS treatment only helped a little." On reassess of trazadone, patient is asleep. Patient denies SI/HI/AVH and remains safe on level III obs. Will continue to monitor throughout the night.

## 2018-05-13 NOTE — Progress Notes (Signed)
D: Pt denies SI/HI/AVh. Pt is pleasant and cooperative. Pt visible in the dayroom pt appeared with brighter affect, pt stated she was [facility] to be [facility].   A: Pt was offered support and encouragement. Pt was given scheduled medications. Pt was encourage to attend groups. Q 15 minute checks were done for safety.   R:Pt attends groups and interacts well with peers and staff. Pt is taking medication. Pt has no complaints.Pt receptive to treatment and safety maintained on unit.

## 2018-05-13 NOTE — BH Assessment (Signed)
[facility] has been [facility].  Accepting physician is Dr. Toni Amendlapacs.  Attending Physician will be Dr. Toni Amendlapacs.  Patient has been assigned to room 323, [facility] Charge Nurse [place] F.  Call report to [phone].  Representative/Transfer Coordinator is Warden/rangerCalvin Patient pre-admitted by [facility] Patient Access [facility](Gwen)  [facility] Staff Rudene Christians(Greg W., TTS) made aware of acceptance.  Patient bed will be available anytime after 3pm.

## 2018-05-13 NOTE — BHH Group Notes (Signed)
LCSW Group Therapy Note 05/13/2018 11:28 AM  Type of Therapy and Topic: Group Therapy: Overcoming Obstacles  Participation Level: Active  Description of Group:  In this group patients will be encouraged to explore what they see as obstacles to their own wellness and recovery. They will be guided to discuss their thoughts, feelings, and behaviors related to these obstacles. The group will process together ways to cope with barriers, with attention given to specific choices patients can make. Each patient will be challenged to identify changes they are motivated to make in order to overcome their obstacles. This group will be process-oriented, with patients participating in exploration of their own experiences as well as giving and receiving support and challenge from other group members.  Therapeutic Goals: 1. Patient will identify personal and current obstacles as they relate to admission. 2. Patient will identify barriers that currently interfere with their wellness or overcoming obstacles.  3. Patient will identify feelings, thought process and behaviors related to these barriers. 4. Patient will identify two changes they are willing to make to overcome these obstacles:   Summary of Patient Progress Renee Turner was engaged and participated throughout the group session. Renee Turner reports that her current obstacle at discharge is returning home to her parents. Renee Turner reports that she wants to live on her own, however her family does not agree with that choice. Renee Turner states that she wants to have a discussion with her parents to see if they can come to an agreement.     Therapeutic Modalities:  Cognitive Behavioral Therapy Solution Focused Therapy Motivational Interviewing Relapse Prevention Therapy   Alcario DroughtJolan Mandy Fitzwater LCSWA Clinical Social Worker

## 2018-05-13 NOTE — BH Assessment (Signed)
Writer received phone call from [facility] Social Worker Tammy Sours(Greg W.), stating the patient transfer will canceled for the day. Writer updated [facility] BMU Charge RN (Gwen F.) and [facility] Psych MD (Dr. Toni Amendlapacs).

## 2018-05-13 NOTE — Progress Notes (Addendum)
[facility] MD Progress Note  05/13/2018 2:47 PM Renee Turner  MRN:  [id number]  Subjective: " I am doing better since the first day I got here."  Objective: Face to face evaluation completed, case discussed with treatment team and chart reviewed. Patient is a 22 year old female who presented to the behavioral health hospital today for evaluation.  Her therapist had recommended that she come to the hospital today after their individual therapy session.  The patient had also been seen by her outpatient psychiatrist yesterday who recommended probable admission and evaluation for possible ECT for depression.  During this evaluation, patient is alert and oriented x4, calm and cooperative. She is very soft spoken. Her mood remains very depressed and her affect is congruent although does brighten on approach. Patient is new to both providers and although evaluation for ECT was scheduled, Per Dr Parke Poisson request, transfer to [facility] was delayed until tomorrow. She rates both depression and anxiety as 4/10 with 10 being the worst. She denies any active or passive SI, HI or AVH and does not appear internally preoccupied. She reports she is tolerating medications as noted below without difficulty and denies side effects. She denies somatic complaints or acute pain. She is contracting for safety on the unit. Patient has been accepted to [facility] and discharge will be planned for tomorrow.       Principal Problem: <principal problem not specified> Diagnosis:   Patient Active Problem List   Diagnosis Date Noted  . MDD (major depressive disorder), recurrent severe, without psychosis ([place]) [F33.2] 05/06/2018  . 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 Posttraumatic stress disorder [F43.10] 11/13/2016  . Severe episode of recurrent major depressive disorder, without psychotic features ([place]) [F33.2] 11/07/2016  . Diabetes ([place]) [E11.9] 11/07/2016  . Diabetic neuropathy associated with type 2  diabetes mellitus ([place]) [E11.40] 07/19/2016  . Obesity (BMI 30.0-34.9) [E66.9] 07/19/2016  . Type 2 diabetes mellitus with hyperglycemia, without long-term current use of insulin ([place]) [E11.65] 07/19/2016   Total Time spent with patient: 20 minutes  Past Psychiatric History: See admission H&P  Past Medical History:  Past Medical History:  Diagnosis Date  . Anxiety   . Depression   . Diabetes mellitus without complication ([place])    Type II  . Diabetes mellitus, type II ([place])    History reviewed. No pertinent surgical history. Family History: History reviewed. No pertinent family history. Family Psychiatric  History: See admission H&P Social History:  Social History   Substance and Sexual Activity  Alcohol Use Yes     Social History   Substance and Sexual Activity  Drug Use No    Social History   Socioeconomic History  . Marital status: Single    Spouse name: Not on file  . Number of children: Not on file  . Years of education: Not on file  . Highest education level: Not on file  Occupational History  . Not on file  Social Needs  . Financial resource strain: Not on file  . Food insecurity:    Worry: Not on file    Inability: Not on file  . Transportation needs:    Medical: Not on file    Non-medical: Not on file  Tobacco Use  . Smoking status: Never Smoker  . Smokeless tobacco: Never Used  Substance and Sexual Activity  . Alcohol use: Yes  . Drug use: No  . Sexual activity: Yes    Birth control/protection: None  Lifestyle  . Physical activity:  Days per week: Not on file    Minutes per session: Not on file  . Stress: Not on file  Relationships  . Social connections:    Talks on phone: Not on file    Gets together: Not on file    Attends religious service: Not on file    Active member of club or organization: Not on file    Attends meetings of clubs or organizations: Not on file    Relationship status: Not on file  Other Topics Concern  . Not on  file  Social History Narrative  . Not on file   Additional Social History:    Pain Medications: see MAR Prescriptions: see MAR Over the Counter: see MAR History of alcohol / drug use?: No history of alcohol / drug abuse                    Sleep: Good  Appetite:  Fair  Current Medications: Current Facility-Administered Medications  Medication Dose Route Frequency Provider Last Rate Last Dose  . acetaminophen (TYLENOL) tablet 650 mg  650 mg Oral Q6H PRN Nanci Pina, FNP   650 mg at 05/09/18 2223  . alum & mag hydroxide-simeth (MAALOX/MYLANTA) 200-200-20 MG/5ML suspension 30 mL  30 mL Oral Q4H PRN Starkes, Takia S, FNP      . exenatide (BYETTA) immediate release injection  5 mcg Subcutaneous BID WC Sharma Covert, MD      . ferrous sulfate tablet 325 mg  325 mg Oral Q breakfast Sharma Covert, MD   325 mg at 05/13/18 0814  . FLUoxetine (PROZAC) capsule 40 mg  40 mg Oral Daily Sharma Covert, MD   40 mg at 05/13/18 0814  . gabapentin (NEURONTIN) capsule 300 mg  300 mg Oral TID Sharma Covert, MD   300 mg at 05/13/18 1159  . hydrOXYzine (ATARAX/VISTARIL) tablet 25 mg  25 mg Oral TID PRN Nanci Pina, FNP   25 mg at 05/09/18 2221  . magnesium hydroxide (MILK OF MAGNESIA) suspension 30 mL  30 mL Oral Daily PRN Lavina Hamman, Takia S, FNP      . metFORMIN (GLUCOPHAGE-XR) 24 hr tablet 1,000 mg  1,000 mg Oral BID WC Sharma Covert, MD   1,000 mg at 05/13/18 0815  . protriptyline (VIVACTIL) tablet 10 mg  10 mg Oral TID Sharma Covert, MD   10 mg at 05/13/18 1159  . traZODone (DESYREL) tablet 50 mg  50 mg Oral QHS PRN Sharma Covert, MD   50 mg at 05/12/18 2125    Lab Results:  Results for orders placed or performed during the hospital encounter of 05/06/18 (from the past 48 hour(s))  Glucose, capillary     Status: Abnormal   Collection Time: 05/11/18  5:47 PM  Result Value Ref Range   Glucose-Capillary 117 (H) 70 - 99 mg/dL  Glucose, capillary      Status: None   Collection Time: 05/12/18  5:43 AM  Result Value Ref Range   Glucose-Capillary 94 70 - 99 mg/dL   Comment 1 Notify RN    Comment 2 Document in Chart   Glucose, capillary     Status: None   Collection Time: 05/12/18 12:04 PM  Result Value Ref Range   Glucose-Capillary 92 70 - 99 mg/dL   Comment 1 Notify RN    Comment 2 Document in Chart   Glucose, capillary     Status: Abnormal   Collection Time: 05/12/18  4:56  PM  Result Value Ref Range   Glucose-Capillary 137 (H) 70 - 99 mg/dL   Comment 1 Notify RN    Comment 2 Document in Chart   Glucose, capillary     Status: Abnormal   Collection Time: 05/13/18  5:56 AM  Result Value Ref Range   Glucose-Capillary 101 (H) 70 - 99 mg/dL  Glucose, capillary     Status: Abnormal   Collection Time: 05/13/18 11:56 AM  Result Value Ref Range   Glucose-Capillary 103 (H) 70 - 99 mg/dL    Blood Alcohol level:  Lab Results  Component Value Date   ETH <5 57/26/2035    Metabolic Disorder Labs: Lab Results  Component Value Date   HGBA1C 6.5 (H) 05/07/2018   MPG 139.85 05/07/2018   No results found for: PROLACTIN Lab Results  Component Value Date   CHOL 181 05/07/2018   TRIG 92 05/07/2018   HDL 46 05/07/2018   CHOLHDL 3.9 05/07/2018   VLDL 18 05/07/2018   LDLCALC 117 (H) 05/07/2018    Physical Findings: AIMS: Facial and Oral Movements Muscles of Facial Expression: None, normal Lips and Perioral Area: None, normal Jaw: None, normal Tongue: None, normal,Extremity Movements Upper (arms, wrists, hands, fingers): None, normal Lower (legs, knees, ankles, toes): None, normal, Trunk Movements Neck, shoulders, hips: None, normal, Overall Severity Severity of abnormal movements (highest score from questions above): None, normal Incapacitation due to abnormal movements: None, normal Patient's awareness of abnormal movements (rate only patient's report): No Awareness, Dental Status Current problems with teeth and/or dentures?:  No Does patient usually wear dentures?: No  CIWA:  CIWA-Ar Total: 0 COWS:  COWS Total Score: 0  Musculoskeletal: Strength & Muscle Tone: within normal limits Gait & Station: normal Patient leans: N/A  Psychiatric Specialty Exam: Physical Exam  Nursing note and vitals reviewed. Constitutional: She is oriented to person, place, and time. She appears well-developed and well-nourished.  HENT:  Head: Normocephalic and atraumatic.  Respiratory: Effort normal.  Neurological: She is alert and oriented to person, place, and time.    Review of Systems  Psychiatric/Behavioral: Positive for depression. Negative for hallucinations, memory loss, substance abuse and suicidal ideas. The patient is nervous/anxious. The patient does not have insomnia.   All other systems reviewed and are negative.   Blood pressure 98/75, pulse (!) 118, temperature 98.8 F (37.1 C), temperature source Oral, resp. rate 20, height 5' 6.5" (1.689 m), weight 91.6 kg (202 lb), SpO2 100 %.Body mass index is 32.12 kg/m.  General Appearance: Casual  Eye Contact:  Fair  Speech:  Slow  Volume:  Decreased  Mood:  Depressed  Affect:  Congruent brightens on approach   Thought Process:  Coherent  Orientation:  Full (Time, Place, and Person)  Thought Content:  Logical  Suicidal Thoughts:  No  Homicidal Thoughts:  No  Memory:  Immediate;   Fair Recent;   Fair Remote;   Fair  Judgement:  Intact  Insight:  Fair  Psychomotor Activity:  Psychomotor Retardation  Concentration:  Concentration: Fair and Attention Span: Fair  Recall:  [employer] of Knowledge:  Fair  Language:  Fair  Akathisia:  Negative  Handed:  Right  AIMS (if indicated):     Assets:  Desire for Improvement Housing Physical Health Resilience Social Support  ADL's:  Intact  Cognition:  WNL  Sleep:  Number of Hours: 6.75     Treatment Plan Summary: Reviewed current treatment plan, Will continue the following plan without adjustments at this time;  Daily contact with patient to assess and evaluate symptoms and progress in treatment, Medication management and Plan.   Patient is seen to day for follow-up evaluation. Her mood remains depressed and she continues to take  protriptyline 10 mg po daily at bedtime as well as Prozac 40 mg po daily for depression. Will resume these medication along with Neurontin 300 mg po TID for anxiety, Vistaril 25 mg po TID as needed for anxiety and Trazodone 50 mg po daily at bedtime as needed for sleep.  ECT postponed until tomorrow. Will resume Metformin XR for type II DM.   Labs: UDS negative. TSH normal. Lipid panel shows LDL of 117 otherwise normal. HgbA1c 6.5. CMP showed glucose of 118 and AST of 14 otherwise normal. CBC showed WBC of 10.7 otherwise normal.   Mordecai Maes, NP 05/13/2018, 2:47 PM   Patient ID: Ladona Ridgel, female   DOB: 1996-06-15, 22 y.o.   MRN: [id number] .Marland KitchenAgree with NP Progress Note

## 2018-05-14 ENCOUNTER — Encounter (HOSPITAL_COMMUNITY): Payer: Self-pay | Admitting: Behavioral Health

## 2018-05-14 ENCOUNTER — Other Ambulatory Visit: Payer: Self-pay

## 2018-05-14 ENCOUNTER — Inpatient Hospital Stay
Admission: AD | Admit: 2018-05-14 | Discharge: 2018-05-30 | DRG: 885 | Disposition: A | Discharge status: Home / Self Care | Payer: BLUE CROSS/BLUE SHIELD | Source: Intra-hospital | Attending: Psychiatry | Admitting: Psychiatry

## 2018-05-14 DIAGNOSIS — F5101 Primary insomnia: Secondary | ICD-10-CM

## 2018-05-14 DIAGNOSIS — F332 Major depressive disorder, recurrent severe without psychotic features: Secondary | ICD-10-CM | POA: Diagnosis present

## 2018-05-14 DIAGNOSIS — F341 Dysthymic disorder: Secondary | ICD-10-CM

## 2018-05-14 DIAGNOSIS — R45851 Suicidal ideations: Secondary | ICD-10-CM | POA: Diagnosis present

## 2018-05-14 DIAGNOSIS — F4310 Post-traumatic stress disorder, unspecified: Secondary | ICD-10-CM | POA: Diagnosis present

## 2018-05-14 DIAGNOSIS — E1140 Type 2 diabetes mellitus with diabetic neuropathy, unspecified: Secondary | ICD-10-CM | POA: Diagnosis present

## 2018-05-14 DIAGNOSIS — E119 Type 2 diabetes mellitus without complications: Secondary | ICD-10-CM

## 2018-05-14 DIAGNOSIS — E669 Obesity, unspecified: Secondary | ICD-10-CM | POA: Diagnosis present

## 2018-05-14 DIAGNOSIS — Z62810 Personal history of physical and sexual abuse in childhood: Secondary | ICD-10-CM | POA: Diagnosis present

## 2018-05-14 DIAGNOSIS — Z79899 Other long term (current) drug therapy: Secondary | ICD-10-CM

## 2018-05-14 DIAGNOSIS — Z7984 Long term (current) use of oral hypoglycemic drugs: Secondary | ICD-10-CM | POA: Diagnosis not present

## 2018-05-14 DIAGNOSIS — R200 Anesthesia of skin: Secondary | ICD-10-CM

## 2018-05-14 LAB — GLUCOSE, CAPILLARY
Glucose-Capillary: 108 mg/dL — ABNORMAL HIGH (ref 70–99)
Glucose-Capillary: 122 mg/dL — ABNORMAL HIGH (ref 70–99)

## 2018-05-14 LAB — PREGNANCY, URINE: PREG TEST UR: NEGATIVE

## 2018-05-14 MED ORDER — EXENATIDE 5 MCG/0.02ML ~~LOC~~ SOPN: 5.0000 ug | PEN_INJECTOR | Freq: Two times a day (BID) | SUBCUTANEOUS | 0 refills | Status: DC

## 2018-05-14 MED ORDER — HYDROXYZINE HCL 25 MG PO TABS: 25.0000 mg | ORAL_TABLET | Freq: Three times a day (TID) | ORAL | 0 refills | Status: DC | PRN

## 2018-05-14 MED ORDER — TRAZODONE HCL 50 MG PO TABS: 50.0000 mg | ORAL_TABLET | Freq: Every day | ORAL | 0 refills | Status: DC

## 2018-05-14 MED ORDER — METFORMIN HCL ER (OSM) 1000 MG PO TB24: 1000.0000 mg | ORAL_TABLET | Freq: Two times a day (BID) | ORAL | 0 refills | Status: DC

## 2018-05-14 MED ORDER — EXENATIDE 5 MCG/0.02ML ~~LOC~~ SOPN: 5.0000 ug | PEN_INJECTOR | Freq: Two times a day (BID) | SUBCUTANEOUS | Status: DC

## 2018-05-14 MED ORDER — FERROUS SULFATE 325 (65 FE) MG PO TABS
325.0000 mg | ORAL_TABLET | Freq: Every day | ORAL | Status: DC
  Administered 2018-05-15 – 2018-05-30 (×16): 325 mg via ORAL
  Filled 2018-05-14 (×17): qty 1

## 2018-05-14 MED ORDER — PROTRIPTYLINE HCL 10 MG PO TABS: 10.0000 mg | ORAL_TABLET | Freq: Three times a day (TID) | ORAL | 0 refills | Status: DC

## 2018-05-14 MED ORDER — FERROUS SULFATE 325 (65 FE) MG PO TABS: 325.0000 mg | ORAL_TABLET | Freq: Every day | ORAL | 0 refills | Status: DC

## 2018-05-14 MED ORDER — MAGNESIUM HYDROXIDE 400 MG/5ML PO SUSP: 30.0000 mL | Freq: Every day | ORAL | Status: DC | PRN

## 2018-05-14 MED ORDER — MAGNESIUM HYDROXIDE 400 MG/5ML PO SUSP
30.0000 mL | Freq: Every day | ORAL | Status: DC | PRN
  Administered 2018-05-18: 30 mL via ORAL
  Filled 2018-05-14: qty 30

## 2018-05-14 MED ORDER — PROTRIPTYLINE HCL 10 MG PO TABS: 10.0000 mg | ORAL_TABLET | Freq: Three times a day (TID) | ORAL | Status: DC

## 2018-05-14 MED ORDER — GABAPENTIN 300 MG PO CAPS: 300.0000 mg | ORAL_CAPSULE | Freq: Three times a day (TID) | ORAL | 0 refills | Status: DC

## 2018-05-14 MED ORDER — TRAZODONE HCL 50 MG PO TABS
50.0000 mg | ORAL_TABLET | Freq: Every evening | ORAL | Status: DC | PRN
  Administered 2018-05-15 – 2018-05-29 (×15): 50 mg via ORAL
  Filled 2018-05-14 (×15): qty 1

## 2018-05-14 MED ORDER — FLUOXETINE HCL 20 MG PO CAPS
40.0000 mg | ORAL_CAPSULE | Freq: Every day | ORAL | Status: DC
  Administered 2018-05-15 – 2018-05-30 (×16): 40 mg via ORAL
  Filled 2018-05-14 (×16): qty 2

## 2018-05-14 MED ORDER — TRAZODONE HCL 50 MG PO TABS: 50.0000 mg | ORAL_TABLET | Freq: Every evening | ORAL | 0 refills | Status: DC | PRN

## 2018-05-14 MED ORDER — HYDROXYZINE HCL 25 MG PO TABS
25.0000 mg | ORAL_TABLET | Freq: Three times a day (TID) | ORAL | Status: DC | PRN
  Administered 2018-05-17 – 2018-05-29 (×8): 25 mg via ORAL
  Filled 2018-05-14 (×8): qty 1

## 2018-05-14 MED ORDER — GABAPENTIN 300 MG PO CAPS
300.0000 mg | ORAL_CAPSULE | Freq: Three times a day (TID) | ORAL | Status: DC
  Administered 2018-05-15 – 2018-05-30 (×41): 300 mg via ORAL
  Filled 2018-05-14 (×41): qty 1

## 2018-05-14 MED ORDER — FLUOXETINE HCL 40 MG PO CAPS: 40.0000 mg | ORAL_CAPSULE | Freq: Every day | ORAL | 0 refills | Status: DC

## 2018-05-14 MED ORDER — ACETAMINOPHEN 325 MG PO TABS
650.0000 mg | ORAL_TABLET | Freq: Four times a day (QID) | ORAL | Status: DC | PRN
  Administered 2018-05-16 – 2018-05-29 (×5): 650 mg via ORAL
  Filled 2018-05-14 (×5): qty 2

## 2018-05-14 MED ORDER — ALUM & MAG HYDROXIDE-SIMETH 200-200-20 MG/5ML PO SUSP
30.0000 mL | ORAL | Status: DC | PRN
  Administered 2018-05-28 – 2018-05-29 (×2): 30 mL via ORAL
  Filled 2018-05-14 (×2): qty 30

## 2018-05-14 MED ORDER — METFORMIN HCL ER 500 MG PO TB24
1000.0000 mg | ORAL_TABLET | Freq: Two times a day (BID) | ORAL | Status: DC
  Administered 2018-05-15 – 2018-05-29 (×28): 1000 mg via ORAL
  Filled 2018-05-14 (×33): qty 2
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
## 2018-05-14 NOTE — Progress Notes (Signed)
Recreation Therapy Notes  Date: 7.10.19 Time: 0930 Location: 300 [address] Dayroom  Group Topic: Stress Management  Goal Area(s) Addresses:  Patient will verbalize importance of using healthy stress management.  Patient will identify positive emotions associated with healthy stress management.   Intervention: Stress Management  Activity :  Guided Imagery.  LRT introduced the stress management technique of guided imagery.  LRT read a script to allow patients to visualize being outside on a bright summer day.  Patients were to follow along as script was read.  Education:  Stress Management, Discharge Planning.   Education Outcome: Acknowledges edcuation/In group clarification offered/Needs additional education  Clinical Observations/Feedback: Pt did not attend group.    Caroll RancherMarjette Inaki Vantine, LRT/CTRS         Caroll RancherLindsay, Amrita Radu A 05/14/2018 12:07 PM

## 2018-05-14 NOTE — Progress Notes (Signed)
D:  Patient's self inventory sheet, patient sleeps good, sleep medication given.  Good appetite, normal energy level, good concentration.  Rated depression 3, hopeless 2, anxiety 4.  Denied withdrawals.  Denied SI.  Physical problems, headaches.  Physical pain denied.  Goal is leave for [place].  No discharge plans. A:  Medications administered per MD orders.  Emotional support and encouragement given patient. R:  Denied SI and HI, contracts for safety.  Denied A/V hallucinations.  Safety maintained with 15 minute checks.

## 2018-05-14 NOTE — [facility] Suicide Risk Assessment (Signed)
[facility]   Principal Problem: Severe episode of recurrent major depressive disorder, without psychotic features [facility]([name]) Discharge Diagnoses:  Patient Active Problem List   Diagnosis Date Noted  . MDD (major depressive disorder), recurrent severe, without psychosis ([name]) [F33.2] 05/06/2018  . 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 Posttraumatic stress disorder [F43.10] 11/13/2016  . Severe episode of recurrent major depressive disorder, without psychotic features ([name]) [F33.2] 11/07/2016  . Diabetes ([name]) [E11.9] 11/07/2016  . Diabetic neuropathy associated with type 2 diabetes mellitus ([name]) [E11.40] 07/19/2016  . Obesity (BMI 30.0-34.9) [E66.9] 07/19/2016  . Type 2 diabetes mellitus with hyperglycemia, without long-term current use of insulin ([name]) [E11.65] 07/19/2016    Total Time spent with patient: 30 minutes  Musculoskeletal: Strength & Muscle Tone: within normal limits Gait & Station: normal Patient leans: N/A  Psychiatric Specialty Exam: ROS reports mild headache, no chest pain, no shortness of breath, no vomiting , no fever, no chills   Blood pressure (!) 120/92, pulse 76, temperature 98.8 F (37.1 C), temperature source Oral, resp. rate 18, height 5' 6.5" (1.689 m), weight 91.6 kg (202 lb), SpO2 100 %.Body mass index is 32.12 kg/m.  General Appearance: Fairly Groomed  Patent attorneyye Contact::  Fair  Speech:  Normal Rate409  Volume:  Decreased  Mood:  reports her mood is " a little better"- presents depressed  Affect:  constricted  Thought Process:  Linear and Descriptions of Associations: Intact  Orientation:  Other:  alert and attentive  Thought Content:  no hallucinations, no delusions expressed, does not appear internally preoccupied   Suicidal Thoughts:  No currently denies suicidal or self injurious ideations  Homicidal Thoughts:  No  Memory:  recent and remote grossly intact   Judgement:  Fair  Insight:  Fair  Psychomotor  Activity:  Decreased  Concentration:  Good  Recall:  Good  Fund of Knowledge:Good  Language: Good  Akathisia:  Negative  Handed:  Right  AIMS (if indicated):     Assets:  Desire for Improvement Resilience  Sleep:  Number of Hours: 6.75  Cognition: WNL  ADL's:  Intact   Mental Status Per Nursing Assessment::   On Admission:  Suicidal ideation indicated by patient, Self-harm thoughts, Intention to act on suicide plan  Demographic Factors:  22 year old single female, no children, lives with parents , employed   Loss Factors: Family  stressors, chronic depression   Historical Factors: History of chronic depression, prior psychiatric admission for depression ( 2018), no history of suicidal attempts, history of self cutting .Denies history of psychosis.  Risk Reduction Factors:   Employed, Living with another person, especially a relative and Positive coping skills or problem solving skills  Continued Clinical Symptoms:  Patient presents alert, attentive, with constricted, subdued affect, endorses depression, describes it is partially improved compared to admission. Speech is slow and soft .  No thought disorder noted,denies suicidal or self injurious ideations, denies homicidal or violent ideations , no hallucinations,no delusions . Denies medication side effects at this time. Patient's case has been evaluated for ECT treatment by Dr. Toni Amendlapacs- considered to be appropriate candidate, plan is to transfer to [facility] to start said treatment under the supervision, management of Dr. Toni Amendlapacs . We reviewed ECT side effects and indication. Cooperative, polite on approach.   Cognitive Features That Contribute To Risk:  No gross cognitive deficits noted upon discharge. Is alert , attentive, and oriented x 3   Suicide Risk:  Moderate:  Frequent suicidal ideation with  limited intensity, and duration, some specificity in terms of plans, no associated intent, good  self-control, limited dysphoria/symptomatology, some risk factors present, and identifiable protective factors, including available and accessible social support.  Follow-up Information    [facility] Follow up on 05/14/2018.   Why:  You will be transferred to [facility] for inpatient ECT therapy. Contact information: [address] [id number] [place] [address] [phone]          Plan Of Care/Follow-up recommendations:  Activity:  as tolerated  Diet:  Regular Tests:  NA Other:  See below  Patient is being transferred to [facility] Inpatient psychiatric unit to initiate ECT treatment under the management of Dr. Toni Amend .  Craige Cotta, MD 05/14/2018, 2:42 PM

## 2018-05-14 NOTE — Progress Notes (Signed)
  [facility] Dis[facility] :  Will you be returning to the same living situation after discharge:  No. transfer [facility] on the inpatient BMU. At discharge, do you have transportation home?: Yes,  [facility]. Do you have the ability to pay for your medications: Yes,  BCBS  Release of information consent forms completed and in the chart;  Patient's signature needed at discharge.  Patient to Follow up at: Follow-up Information    [facility] Follow up on 05/14/2018.   Why:  You [facility] be transferred [facility] for inpatient ECT therapy. Contact information: [address] [id number] [place] [id number] [phone]          Next level of care provider has access to [facility] Link:yes  Safety Planning and Suicide Prevention discussed: No.Pt declined.  Have you used any form of tobacco in the last 30 days? (Cigarettes, Smokeless Tobacco, Cigars, and/or Pipes): No  Has patient been referred to the Quitline?: N/A patient is not a smoker  Patient has been referred for addiction treatment: N/A  Lorri FrederickWierda, Zoya Sprecher Jon, LCSW 05/14/2018, 1:40 PM

## 2018-05-14 NOTE — Progress Notes (Signed)
CSW spoke to [place], Consulting civil engineerCharge RN at [facility].  She said pt can transfer today and she has already received report yesterday. Garner NashGregory Shreena Baines, MSW, LCSW Clinical Social Worker 05/14/2018 12:02 PM

## 2018-05-14 NOTE — Progress Notes (Signed)
Discharge Note:  [facility].  Patient denied SI and HI.  Denied A/V hallucinations.  Suicide prevention information given and discussed with patient who stated she understood and had no questions.  Patient stated she received all her belongings, clothing, toiletries, misc items, etc.  Patient stated she appreciated all assistance received from [facility].  All required discharge information given to patient at discharge.

## 2018-05-14 NOTE — Progress Notes (Signed)
[place] transportation called for pickup. 
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
## 2018-05-14 NOTE — Progress Notes (Signed)
UA collected and put in lab refrigerator for pick up per MD order.

## 2018-05-14 NOTE — Progress Notes (Signed)
Patient stated she needs a stool softener.  Takes store brand at home.  Cannot remember her last BM date.

## 2018-05-15 ENCOUNTER — Other Ambulatory Visit: Payer: Self-pay | Admitting: Psychiatry

## 2018-05-15 ENCOUNTER — Inpatient Hospital Stay: Payer: BLUE CROSS/BLUE SHIELD

## 2018-05-15 DIAGNOSIS — F332 Major depressive disorder, recurrent severe without psychotic features: Principal | ICD-10-CM

## 2018-05-15 NOTE — Plan of Care (Signed)
Patient is alert and oriented. Patient is flat and sullen and isolates to room . Patient had no appetite during breakfast and did not eat. Patient is cooperative and takes medication. Patient is new to the unit and had to be reoriented this morning. Patient states she is looking forward to talking with the Doctor about ECT procedure. Patient rates sleep last night as good, rates her energy level as normal, but rarely out of bed. Depression 3/10; hopelessness 0/10; Anxiety 3/10. Patient goal is to try to stay out of her room today. Nurse will encourage participation in group and eating meals. Problem: Coping: Goal: Coping ability will improve Outcome: Not Progressing Goal: Will verbalize feelings Outcome: Not Progressing   Problem: Safety: Goal: Ability to disclose and discuss suicidal ideas will improve Outcome: Progressing Goal: Ability to identify and utilize support systems that promote safety will improve Outcome: Not Progressing   Problem: Education: Goal: Knowledge of [facility] Education information/materials will improve Outcome: Not Progressing Goal: Emotional status will improve Outcome: Not Progressing Goal: Mental status will improve Outcome: Not Progressing Goal: Verbalization of understanding the information provided will improve Outcome: Not Progressing

## 2018-05-15 NOTE — Progress Notes (Signed)
Recreation Therapy Notes  Date: 05/15/2018  Time: 3:00pm  Location: Craft room  Behavioral response: N/A  Group Type: Game  Participation level: N/A  Communication: Patient did not attend group.  Comments: N/A  Jurline Folger LRT/CTRS        Tabita Corbo 05/15/2018 3:34 PM

## 2018-05-15 NOTE — Progress Notes (Signed)
Patient seems depressed and affect is flat. Refused to go to group therapy at bedtime. Denies any SI/HI/AVH. Patient is isolated herself in her room. Patient was encouraged to attend group sessions and be complaint with therapy. No behavior issues noted. Will continue to monitor every 15 minutes.

## 2018-05-15 NOTE — [facility] Suicide Risk Assessment (Signed)
[facility] INPATIENT:  Family/Significant Other Suicide Prevention Education  Suicide Prevention Education:  Patient Refusal for Family/Significant Other Suicide Prevention Education: The patient Renee Turner has refused to provide written consent for family/significant other to be provided Family/Significant Other Suicide Prevention Education during admission and/or prior to discharge.  Physician notified.  Alden Hipp, LCSW 05/15/2018, 8:58 AM

## 2018-05-15 NOTE — [facility] Group Notes (Addendum)
LCSW Group Therapy Note 05/15/2018 9:00 AM  Type of Therapy and Topic:  Group Therapy:  Setting Goals  Participation Level:  Did Not Attend  Description of Group: In this process group, patients discussed using strengths to work toward goals and address challenges.  Patients identified two positive things about themselves and one goal they were working on.  Patients were given the opportunity to share openly and support each other's plan for self-empowerment.  The group discussed the value of gratitude and were encouraged to have a daily reflection of positive characteristics or circumstances.  Patients were encouraged to identify a plan to utilize their strengths to work on current challenges and goals.  Therapeutic Goals 1. Patient will verbalize personal strengths/positive qualities and relate how these can assist with achieving desired personal goals 2. Patients will verbalize affirmation of peers plans for personal change and goal setting 3. Patients will explore the value of gratitude and positive focus as related to successful achievement of goals 4. Patients will verbalize a plan for regular reinforcement of personal positive qualities and circumstances.  Summary of Patient Progress:   Renee Turner was invited to today's group, but chose not to attend.    Therapeutic Modalities Cognitive Behavioral Therapy Motivational Interviewing    [name], Kentucky[place]CS[place] 05/15/2018 1:18 PM

## 2018-05-15 NOTE — Progress Notes (Signed)
Patient slept for 7 hours and 15 minutes.  

## 2018-05-15 NOTE — BHH Group Notes (Signed)
  05/15/2018  Time: 1PM  Type of Therapy/Topic:  Group Therapy:  Balance in Life  Participation Level:  None  Description of Group:   This group will address the concept of balance and how it feels and looks when one is unbalanced. Patients will be encouraged to process areas in their lives that are out of balance and identify reasons for remaining unbalanced. Facilitators will guide patients in utilizing problem-solving interventions to address and correct the stressor making their life unbalanced. Understanding and applying boundaries will be explored and addressed for obtaining and maintaining a balanced life. Patients will be encouraged to explore ways to assertively make their unbalanced needs known to significant others in their lives, using other group members and facilitator for support and feedback.  Therapeutic Goals: 1. Patient will identify two or more emotions or situations they have that consume much of in their lives. 2. Patient will identify signs/triggers that life has become out of balance:  3. Patient will identify two ways to set boundaries in order to achieve balance in their lives:  4. Patient will demonstrate ability to communicate their needs through discussion and/or role plays  Summary of Patient Progress: Pt attended group but did not participate in discussion. When prompted, pt was able to say she was "feeling fine today," but was not able to provide additional information. Pt did not participate in group conversation, but was mindful of others when they were sharing, and was not disruptive in group. Pt will continue to work towards her tx goals.      Therapeutic Modalities:   Cognitive Behavioral Therapy Solution-Focused Therapy Assertiveness Training  Heidi DachKelsey Johann Gascoigne, MSW, LCSW Clinical Social Worker 05/15/2018 2:34 PM
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
## 2018-05-15 NOTE — Progress Notes (Signed)
Recreation Therapy Notes   Date: 05/15/2018  Time: 9:30 am   Location: Craft Room   Behavioral response: N/A   Intervention Topic:  Anger Management   Discussion/Intervention: Patient did not attend group.   Clinical Observations/Feedback:  Patient did not attend group.   Oberon Hehir LRT/CTRS        Unique Searfoss 05/15/2018 10:39 AM

## 2018-05-15 NOTE — [facility] Suicide Risk Assessment (Signed)
[facility] Admission Suicide Risk Assessment   Nursing information obtained from:  Patient Demographic factors:  Unemployed Current Mental Status:  NA Loss Factors:  NA Historical Factors:  NA Risk Reduction Factors:  Living with another person, especially a relative, Sense of responsibility to family  Total Time spent with patient: 1 hour Principal Problem: Severe recurrent major depression without psychotic features ([name]) Diagnosis:   Patient Active Problem List   Diagnosis Date Noted  . Severe recurrent major depression without psychotic features ([name]) [F33.2] 05/14/2018  . MDD (major depressive disorder), recurrent severe, without psychosis ([name]) [F33.2] 05/06/2018  . 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 Posttraumatic stress disorder [F43.10] 11/13/2016  . Severe episode of recurrent major depressive disorder, without psychotic features ([name]) [F33.2] 11/07/2016  . Diabetes ([name]) [E11.9] 11/07/2016  . Diabetic neuropathy associated with type 2 diabetes mellitus ([name]) [E11.40] 07/19/2016  . Obesity (BMI 30.0-34.9) [E66.9] 07/19/2016  . Type 2 diabetes mellitus with hyperglycemia, without long-term current use of insulin ([name]) [E11.65] 07/19/2016   Subjective Data: This is a 22 year old woman with a long history of depression going back years.  She is transferred to [place] from [facility] for ECT.  Patient continues to complain of depressed mood lethargy lack of interest lack of enjoyment.  She denies any acute suicidal intent or plan and denies any psychotic symptoms.  Continued Clinical Symptoms:  Alcohol Use Disorder Identification Test Final Score (AUDIT): 3 The "Alcohol Use Disorders Identification Test", Guidelines for Use in Primary Care, Second Edition.  World Science writer [facility]). Score between 0-7:  no or low risk or alcohol related problems. Score between 8-15:  moderate risk of alcohol related problems. Score between 16-19:  high risk of  alcohol related problems. Score 20 or above:  warrants further diagnostic evaluation for alcohol dependence and treatment.   CLINICAL FACTORS:   Depression:   Anhedonia Hopelessness   Musculoskeletal: Strength & Muscle Tone: within normal limits Gait & Station: normal Patient leans: N/A  Psychiatric Specialty Exam: Physical Exam  Nursing note and vitals reviewed. Constitutional: She appears well-developed and well-nourished.  HENT:  Head: Normocephalic and atraumatic.  Eyes: Pupils are equal, round, and reactive to light. Conjunctivae are normal.  Neck: Normal range of motion.  Cardiovascular: Normal heart sounds.  Respiratory: Effort normal.  GI: Soft.  Musculoskeletal: Normal range of motion.  Neurological: She is alert.  Skin: Skin is warm and dry.  Psychiatric: Judgment normal. Her affect is blunt. Her speech is delayed. She is slowed. Thought content is not paranoid. Cognition and memory are normal. She exhibits a depressed mood. She expresses no homicidal and no suicidal ideation.    Review of Systems  Constitutional: Negative.   HENT: Negative.   Eyes: Negative.   Respiratory: Negative.   Cardiovascular: Negative.   Gastrointestinal: Negative.   Musculoskeletal: Negative.   Skin: Negative.   Neurological: Negative.   Psychiatric/Behavioral: Positive for depression. Negative for hallucinations, memory loss, substance abuse and suicidal ideas. The patient is nervous/anxious. The patient does not have insomnia.     Blood pressure 96/67, pulse (!) 144, temperature 98.1 F (36.7 C), temperature source Oral, resp. rate 18, height 5\' 6"  (1.676 m), weight 92.5 kg (204 lb), SpO2 100 %.Body mass index is 32.93 kg/m.  General Appearance: Fairly Groomed  Eye Contact:  Fair  Speech:  Slow  Volume:  Decreased  Mood:  Depressed  Affect:  Depressed  Thought Process:  Coherent  Orientation:  Full (Time, Place, and Person)  Thought  Content:  Logical  Suicidal Thoughts:  No   Homicidal Thoughts:  No  Memory:  Immediate;   Fair Recent;   Fair Remote;   Fair  Judgement:  Fair  Insight:  Fair  Psychomotor Activity:  Decreased  Concentration:  Concentration: Fair  Recall:  [employer]  Fund of Knowledge:  Fair  Language:  Fair  Akathisia:  No  Handed:  Right  AIMS (if indicated):     Assets:  Desire for Improvement Financial Resources/Insurance Physical Health Resilience Social Support  ADL's:  Impaired  Cognition:  Impaired,  Mild  Sleep:         COGNITIVE FEATURES THAT CONTRIBUTE TO RISK:  Closed-mindedness    SUICIDE RISK:   Mild:  Suicidal ideation of limited frequency, intensity, duration, and specificity.  There are no identifiable plans, no associated intent, mild dysphoria and related symptoms, good self-control (both objective and subjective assessment), few other risk factors, and identifiable protective factors, including available and accessible social support.  PLAN OF CARE: Patient admitted to our service for treatment with ECT.  Patient will have daily reevaluation of mood symptoms including suicidality and will have this included in treatment team discussions.  She is going to receive ECT treatment as well as medication and therapy.  Appropriate discharge plans will be made before she leaves the hospital.  I certify that inpatient services furnished can reasonably be expected to improve the patient's condition.   Mordecai RasmussenJohn Clapacs, MD 05/15/2018, 4:53 PM

## 2018-05-15 NOTE — Tx Team (Addendum)
Interdisciplinary Treatment and Diagnostic Plan Update  05/15/2018 Time of Session: [place] MRN: [id number]  Principal Diagnosis: <principal problem not specified>  Secondary Diagnoses: Active Problems:   Severe recurrent major depression without psychotic features ([name])   Current Medications:  Current Facility-Administered Medications  Medication Dose Route Frequency Provider Last Rate Last Dose  . acetaminophen (TYLENOL) tablet 650 mg  650 mg Oral Q6H PRN Clapacs, John T, MD      . alum & mag hydroxide-simeth (MAALOX/MYLANTA) 200-200-20 MG/5ML suspension 30 mL  30 mL Oral Q4H PRN Clapacs, John T, MD      . exenatide (BYETTA) immediate release injection  5 mcg Subcutaneous BID WC Clapacs, John T, MD      . ferrous sulfate tablet 325 mg  325 mg Oral Q breakfast Clapacs, Madie Reno, MD   325 mg at 05/15/18 0830  . FLUoxetine (PROZAC) capsule 40 mg  40 mg Oral Daily Clapacs, John T, MD   40 mg at 05/15/18 0830  . gabapentin (NEURONTIN) capsule 300 mg  300 mg Oral TID Clapacs, John T, MD   300 mg at 05/15/18 0830  . hydrOXYzine (ATARAX/VISTARIL) tablet 25 mg  25 mg Oral TID PRN Clapacs, John T, MD      . magnesium hydroxide (MILK OF MAGNESIA) suspension 30 mL  30 mL Oral Daily PRN Clapacs, John T, MD      . metFORMIN (GLUCOPHAGE-XR) 24 hr tablet 1,000 mg  1,000 mg Oral BID WC Clapacs, Madie Reno, MD   1,000 mg at 05/15/18 0832  . protriptyline (VIVACTIL) tablet 10 mg  10 mg Oral TID Clapacs, John T, MD      . traZODone (DESYREL) tablet 50 mg  50 mg Oral QHS PRN Clapacs, Madie Reno, MD       PTA Medications: Medications Prior to Admission  Medication Sig Dispense Refill Last Dose  . exenatide (BYETTA) 5 MCG/0.02ML SOPN injection Inject 0.02 mLs (5 mcg total) into the skin 2 (two) times daily with a meal. For DM 1.2 mL 0   . ferrous sulfate 325 (65 FE) MG tablet Take 1 tablet (325 mg total) by mouth daily with breakfast. For iron deficiency anemia 30 tablet 0   . FLUoxetine (PROZAC) 40 MG  capsule Take 1 capsule (40 mg total) by mouth daily. For depression 1 capsule 0   . gabapentin (NEURONTIN) 300 MG capsule Take 1 capsule (300 mg total) by mouth 3 (three) times daily. For pain 90 capsule 0   . hydrOXYzine (ATARAX/VISTARIL) 25 MG tablet Take 1 tablet (25 mg total) by mouth 3 (three) times daily as needed for anxiety. 30 tablet 0   . metformin (FORTAMET) 1000 MG (OSM) 24 hr tablet Take 1 tablet (1,000 mg total) by mouth 2 (two) times daily with a meal. For DM 60 tablet 0   . protriptyline (VIVACTIL) 10 MG tablet Take 1 tablet (10 mg total) by mouth 3 (three) times daily. Narcolepsy 90 tablet 0   . traZODone (DESYREL) 50 MG tablet Take 1 tablet (50 mg total) by mouth at bedtime. Take 2 hours before bedtime: For sleep 1 tablet 0   . traZODone (DESYREL) 50 MG tablet Take 1 tablet (50 mg total) by mouth at bedtime as needed for sleep. 30 tablet 0     Patient Stressors: Health problems Medication change or noncompliance  Patient Strengths: Ability for insight Average or above average intelligence Capable of independent living Communication skills Supportive family/friends  Treatment Modalities: Medication Management, Group therapy, Case  management,  1 to 1 session with clinician, Psychoeducation, Recreational therapy.   Physician Treatment Plan for Primary Diagnosis: <principal problem not specified> Long Term Goal(s):     Short Term Goals:    Medication Management: Evaluate patient's response, side effects, and tolerance of medication regimen.  Therapeutic Interventions: 1 to 1 sessions, Unit Group sessions and Medication administration.  Evaluation of Outcomes: Progressing  Physician Treatment Plan for Secondary Diagnosis: Active Problems:   Severe recurrent major depression without psychotic features ([place])  Long Term Goal(s):     Short Term Goals:       Medication Management: Evaluate patient's response, side effects, and tolerance of medication  regimen.  Therapeutic Interventions: 1 to 1 sessions, Unit Group sessions and Medication administration.  Evaluation of Outcomes: Progressing   RN Treatment Plan for Primary Diagnosis: <principal problem not specified> Long Term Goal(s): Knowledge of disease and therapeutic regimen to maintain health will improve  Short Term Goals: Ability to verbalize feelings will improve, Ability to identify and develop effective coping behaviors will improve and Compliance with prescribed medications will improve  Medication Management: RN will administer medications as ordered by provider, will assess and evaluate patient's response and provide education to patient for prescribed medication. RN will report any adverse and/or side effects to prescribing provider.  Therapeutic Interventions: 1 on 1 counseling sessions, Psychoeducation, Medication administration, Evaluate responses to treatment, Monitor vital signs and CBGs as ordered, Perform/monitor CIWA, COWS, AIMS and Fall Risk screenings as ordered, Perform wound care treatments as ordered.  Evaluation of Outcomes: Progressing   LCSW Treatment Plan for Primary Diagnosis: <principal problem not specified> Long Term Goal(s): Safe transition to appropriate next level of care at discharge, Engage patient in therapeutic group addressing interpersonal concerns.  Short Term Goals: Engage patient in aftercare planning with referrals and resources, Increase emotional regulation and Increase skills for wellness and recovery  Therapeutic Interventions: Assess for all discharge needs, 1 to 1 time with Social worker, Explore available resources and support systems, Assess for adequacy in community support network, Educate family and significant other(s) on suicide prevention, Complete Psychosocial Assessment, Interpersonal group therapy.  Evaluation of Outcomes: Progressing   Progress in Treatment: Attending groups: Yes. Participating in groups: Yes. Taking  medication as prescribed: Yes. Toleration medication: Yes. Family/Significant other contact made: No, will contact:  a support if pt provides consent. Patient understands diagnosis: Yes. Discussing patient identified problems/goals with staff: Yes. Medical problems stabilized or resolved: Yes. Denies suicidal/homicidal ideation: Yes. Issues/concerns per patient self-inventory: No. Other: None at this time.   New problem(s) identified: No, Describe:  none at this time  New Short Term/Long Term Goal(s): Pt will report a decrease in depression by 24 hours prior to discharge.   Patient Goals:  Pt reported her goal for treatment is to, "work on my depression and feel better. I'm here to get ECT."  Discharge Plan or Barriers: Pt will continue tx with her current tx providers [facility] tx and a therapist with [facility].   Reason for Continuation of Hospitalization: Depression Medication stabilization  Estimated Length of Stay: 7 days  Recreational Therapy: Patient Stressors: N/A  Patient Goal: Patient will identify 3 triggers for depression within 5 recreation therapy group sessions  Attendees: Patient: Renee Turner 05/15/2018 11:29 AM  Physician: Dr. Weber Cooks, MD 05/15/2018 11:29 AM  Nursing: Elige Radon, RN 05/15/2018 11:29 AM  RN Care Manager: 05/15/2018 11:29 AM  Social Worker: Alden Hipp, LCSW 05/15/2018 11:29 AM  Recreational Therapist:  05/15/2018 11:29 AM  Other:  05/15/2018 11:29 AM  Other:  05/15/2018 11:29 AM  Other: 05/15/2018 11:29 AM    Scribe for Treatment Team: Alden Hipp, LCSW 05/15/2018 11:29 AM

## 2018-05-16 ENCOUNTER — Inpatient Hospital Stay: Payer: BLUE CROSS/BLUE SHIELD | Admitting: Anesthesiology

## 2018-05-16 ENCOUNTER — Encounter: Payer: Self-pay | Admitting: Psychiatry

## 2018-05-16 ENCOUNTER — Inpatient Hospital Stay (HOSPITAL_COMMUNITY)
Admission: RE | Admit: 2018-05-16 | Discharge: 2018-05-16 | Disposition: A | Discharge status: Home / Self Care | Payer: BLUE CROSS/BLUE SHIELD | Source: Intra-hospital | Attending: Psychiatry | Admitting: Psychiatry

## 2018-05-16 DIAGNOSIS — F332 Major depressive disorder, recurrent severe without psychotic features: Secondary | ICD-10-CM

## 2018-05-16 LAB — GLUCOSE, CAPILLARY
GLUCOSE-CAPILLARY: 106 mg/dL — AB (ref 70–99)
Glucose-Capillary: 91 mg/dL (ref 70–99)

## 2018-05-16 MED ORDER — GLYCOPYRROLATE 0.2 MG/ML IJ SOLN
0.1000 mg | Freq: Once | INTRAMUSCULAR | Status: AC
  Administered 2018-05-16: 0.1 mg via INTRAVENOUS

## 2018-05-16 MED ORDER — FENTANYL CITRATE (PF) 100 MCG/2ML IJ SOLN: 25.0000 ug | INTRAMUSCULAR | Status: DC | PRN

## 2018-05-16 MED ORDER — SUCCINYLCHOLINE CHLORIDE 20 MG/ML IJ SOLN
INTRAMUSCULAR | Status: DC | PRN
  Administered 2018-05-16: 100 mg via INTRAVENOUS

## 2018-05-16 MED ORDER — LABETALOL HCL 5 MG/ML IV SOLN
INTRAVENOUS | Status: DC | PRN
  Administered 2018-05-16: 10 mg via INTRAVENOUS

## 2018-05-16 MED ORDER — SODIUM CHLORIDE 0.9 % IV SOLN
INTRAVENOUS | Status: DC | PRN
  Administered 2018-05-16: 10:00:00 via INTRAVENOUS

## 2018-05-16 MED ORDER — SODIUM CHLORIDE 0.9 % IV SOLN
500.0000 mL | Freq: Once | INTRAVENOUS | Status: AC
  Administered 2018-05-16: 500 mL via INTRAVENOUS

## 2018-05-16 MED ORDER — KETOROLAC TROMETHAMINE 30 MG/ML IJ SOLN
30.0000 mg | Freq: Once | INTRAMUSCULAR | Status: AC
  Administered 2018-05-16: 30 mg via INTRAVENOUS

## 2018-05-16 MED ORDER — METHOHEXITAL SODIUM 100 MG/10ML IV SOSY
PREFILLED_SYRINGE | INTRAVENOUS | Status: DC | PRN
  Administered 2018-05-16: 90 mg via INTRAVENOUS

## 2018-05-16 MED ORDER — GLYCOPYRROLATE 0.2 MG/ML IJ SOLN
INTRAMUSCULAR | Status: AC
  Filled 2018-05-16: qty 1

## 2018-05-16 MED ORDER — KETOROLAC TROMETHAMINE 30 MG/ML IJ SOLN
INTRAMUSCULAR | Status: AC
  Filled 2018-05-16: qty 1

## 2018-05-16 MED ORDER — ONDANSETRON HCL 4 MG/2ML IJ SOLN: 4.0000 mg | Freq: Once | INTRAMUSCULAR | Status: DC | PRN

## 2018-05-16 MED ORDER — ESMOLOL HCL 100 MG/10ML IV SOLN
INTRAVENOUS | Status: DC | PRN
  Administered 2018-05-16: 30 mg via INTRAVENOUS

## 2018-05-16 NOTE — Procedures (Signed)
ECT SERVICES Physician's Interval Evaluation & Treatment Note  Patient Identification: Hydie Langan MRN:  [id number] Date of Evaluation:  05/16/2018 TX #: 1  MADRS:   MMSE: 30  P.E. Findings:  Physical exam is unremarkable  Psychiatric Interval Note:  Very depressed flat withdrawn  Subjective:  Patient is a 22 y.o. female seen for evaluation for Electroconvulsive Therapy. .  Depressed  Treatment Summary:   _0   Right Unilateral             _1  Bilateral   % Energy : 0.3 ms 25%   Impedance: 1530 ohms  Seizure Energy Index: 18,042 V squared  Postictal Suppression Index: 72%  Seizure Concordance Index: 98%  Medications  Pre Shock: Robinul 0.1 mg Toradol 30 mg Brevital 90 mg succinylcholine 100 mg.  Esmolol 30 mg and labetalol 10 mg given just after the seizure still in the procedure room  Post Shock:    Seizure Duration: 33 seconds by EMG, 115 seconds by EEG   Comments: Follow-up on Wednesday  Lungs:  _2   Clear to auscultation               _3  Other:   Heart:    _4   Regular rhythm             _5  irregular rhythm    _6   Previous H&P reviewed, patient examined and there are NO CHANGES                 _7   Previous H&P reviewed, patient examined and there are changes noted.   Alethia Berthold, MD 7/12/201910:36 AM

## 2018-05-16 NOTE — H&P (Signed)
Renee Turner is an 22 y.o. female.   Chief Complaint: Complaint is all related to depression and anxiety HPI: History of long-standing depression and PTSD.  Physically she has diabetes which is under good control.  Past Medical History:  Diagnosis Date  . Anxiety   . Depression   . Diabetes mellitus without complication ([name])    Type II  . Diabetes mellitus, type II ([name])     No past surgical history on file.  No family history on file. Social History:  reports that she has never smoked. She has never used smokeless tobacco. She reports that she drinks alcohol. She reports that she does not use drugs.  Allergies: No Known Allergies   (Not in a hospital admission)  Results for orders placed or performed during the hospital encounter of 05/14/18 (from the past 48 hour(s))  Glucose, capillary     Status: None   Collection Time: 05/16/18  7:07 AM  Result Value Ref Range   Glucose-Capillary 91 70 - 99 mg/dL   Comment 1 Notify RN    Dg Chest 2 View  Result Date: 05/15/2018 CLINICAL DATA:  22 y/o  F; anesthesia. EXAM: CHEST - 2 VIEW COMPARISON:  None. FINDINGS: The heart size and mediastinal contours are within normal limits. Both lungs are clear. The visualized skeletal structures are unremarkable. IMPRESSION: No active cardiopulmonary disease. Electronically Signed   By: Lance  Furusawa-Stratton M.D.   On: 05/15/2018 22:31    Review of Systems  Constitutional: Negative.   HENT: Negative.   Eyes: Negative.   Respiratory: Negative.   Cardiovascular: Negative.   Gastrointestinal: Negative.   Musculoskeletal: Negative.   Skin: Negative.   Neurological: Negative.   Psychiatric/Behavioral: Positive for depression. Negative for hallucinations, memory loss, substance abuse and suicidal ideas. The patient is nervous/anxious. The patient does not have insomnia.     There were no vitals taken for this visit. Physical Exam  Nursing note and vitals reviewed. Constitutional: She appears  well-developed and well-nourished.  HENT:  Head: Normocephalic and atraumatic.  Eyes: Pupils are equal, round, and reactive to light. Conjunctivae are normal.  Neck: Normal range of motion.  Cardiovascular: Regular rhythm and normal heart sounds.  Respiratory: Effort normal. No respiratory distress.  GI: Soft.  Musculoskeletal: Normal range of motion.  Neurological: She is alert.  Skin: Skin is warm and dry.  Psychiatric: Judgment normal. Her affect is blunt. Her speech is delayed. She is slowed and withdrawn. Cognition and memory are normal. She expresses no suicidal ideation.     Assessment/Plan Starting index course of ECT with right unilateral treatments starting today   , MD 05/16/2018, 10:13 AM   

## 2018-05-16 NOTE — Progress Notes (Signed)
[facility] MD Progress Note  05/17/2018 11:46 AM Renee Turner  MRN:  [id number]  Subjective:    Renee Turner is in bed with a hoodie over her face. There is severe psychomotor retardation. Her speech is very soft. There is poor eye contact but she gets up in bed for me. She has no complaints. Sleep and appetite are fair. No side effects from medications. Received ECT treatment on Friday.  Principal Problem: Severe recurrent major depression without psychotic features ([place]) Diagnosis:   Patient Active Problem List   Diagnosis Date Noted  . Severe recurrent major depression without psychotic features ([place]) [F33.2] 05/14/2018    Priority: High  . MDD (major depressive disorder), recurrent severe, without psychosis ([place]) [F33.2] 05/06/2018  . 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 Posttraumatic stress disorder [F43.10] 11/13/2016  . Severe episode of recurrent major depressive disorder, without psychotic features ([place]) [F33.2] 11/07/2016  . Diabetes ([place]) [E11.9] 11/07/2016  . Diabetic neuropathy associated with type 2 diabetes mellitus ([place]) [E11.40] 07/19/2016  . Obesity (BMI 30.0-34.9) [E66.9] 07/19/2016  . Type 2 diabetes mellitus with hyperglycemia, without long-term current use of insulin ([name]) [E11.65] 07/19/2016   Total Time spent with patient: 20 minutes  Past Psychiatric History: depression, anxiety  Past Medical History:  Past Medical History:  Diagnosis Date  . Anxiety   . Depression   . Diabetes mellitus without complication ([place])    Type II  . Diabetes mellitus, type II ([place])    History reviewed. No pertinent surgical history. Family History: History reviewed. No pertinent family history. Family Psychiatric  History: none Social History:  Social History   Substance and Sexual Activity  Alcohol Use Yes     Social History   Substance and Sexual Activity  Drug Use No    Social History   Socioeconomic History  . Marital status: Single    Spouse name: Not on  file  . Number of children: Not on file  . Years of education: Not on file  . Highest education level: Not on file  Occupational History  . Not on file  Social Needs  . Financial resource strain: Not on file  . Food insecurity:    Worry: Not on file    Inability: Not on file  . Transportation needs:    Medical: Not on file    Non-medical: Not on file  Tobacco Use  . Smoking status: Never Smoker  . Smokeless tobacco: Never Used  Substance and Sexual Activity  . Alcohol use: Yes  . Drug use: No  . Sexual activity: Yes    Birth control/protection: None  Lifestyle  . Physical activity:    Days per week: Not on file    Minutes per session: Not on file  . Stress: Not on file  Relationships  . Social connections:    Talks on phone: Not on file    Gets together: Not on file    Attends religious service: Not on file    Active member of club or organization: Not on file    Attends meetings of clubs or organizations: Not on file    Relationship status: Not on file  Other Topics Concern  . Not on file  Social History Narrative  . Not on file   Additional Social History:                         Sleep: Fair  Appetite:  Fair  Current Medications: Current Facility-Administered Medications  Medication Dose Route Frequency Provider Last Rate Last Dose  . acetaminophen (TYLENOL) tablet 650 mg  650 mg Oral Q6H PRN Clapacs, Madie Reno, MD   650 mg at 05/16/18 2115  . alum & mag hydroxide-simeth (MAALOX/MYLANTA) 200-200-20 MG/5ML suspension 30 mL  30 mL Oral Q4H PRN Clapacs, John T, MD      . exenatide (BYETTA) immediate release injection  5 mcg Subcutaneous BID WC Clapacs, Madie Reno, MD   Stopped at 05/16/18 [phone]  . ferrous sulfate tablet 325 mg  325 mg Oral Q breakfast Clapacs, Madie Reno, MD   325 mg at 05/17/18 0850  . FLUoxetine (PROZAC) capsule 40 mg  40 mg Oral Daily Clapacs, Madie Reno, MD   40 mg at 05/17/18 0850  . gabapentin (NEURONTIN) capsule 300 mg  300 mg Oral TID Clapacs,  Madie Reno, MD   300 mg at 05/17/18 0850  . hydrOXYzine (ATARAX/VISTARIL) tablet 25 mg  25 mg Oral TID PRN Clapacs, John T, MD      . magnesium hydroxide (MILK OF MAGNESIA) suspension 30 mL  30 mL Oral Daily PRN Clapacs, John T, MD      . metFORMIN (GLUCOPHAGE-XR) 24 hr tablet 1,000 mg  1,000 mg Oral BID WC Clapacs, Madie Reno, MD   1,000 mg at 05/17/18 0850  . protriptyline (VIVACTIL) tablet 10 mg  10 mg Oral TID Clapacs, Madie Reno, MD   Stopped at 05/16/18 [phone]  . traZODone (DESYREL) tablet 50 mg  50 mg Oral QHS PRN Clapacs, Madie Reno, MD   50 mg at 05/16/18 2115    Lab Results:  Results for orders placed or performed during the hospital encounter of 05/14/18 (from the past 48 hour(s))  Glucose, capillary     Status: None   Collection Time: 05/16/18  7:07 AM  Result Value Ref Range   Glucose-Capillary 91 70 - 99 mg/dL   Comment 1 Notify RN     Blood Alcohol level:  Lab Results  Component Value Date   ETH <5 35/00/9381    Metabolic Disorder Labs: Lab Results  Component Value Date   HGBA1C 6.5 (H) 05/07/2018   MPG 139.85 05/07/2018   No results found for: PROLACTIN Lab Results  Component Value Date   CHOL 181 05/07/2018   TRIG 92 05/07/2018   HDL 46 05/07/2018   CHOLHDL 3.9 05/07/2018   VLDL 18 05/07/2018   LDLCALC 117 (H) 05/07/2018    Physical Findings: AIMS:  , ,  ,  ,    CIWA:    COWS:     Musculoskeletal: Strength & Muscle Tone: within normal limits Gait & Station: normal Patient leans: N/A  Psychiatric Specialty Exam: Physical Exam  Nursing note and vitals reviewed. Psychiatric: Judgment and thought content normal. Her affect is blunt. Her speech is delayed. She is slowed and withdrawn. Cognition and memory are normal. She exhibits a depressed mood.    Review of Systems  Neurological: Negative.   Psychiatric/Behavioral: Positive for depression.  All other systems reviewed and are negative.   Blood pressure 101/60, pulse 91, temperature 98.8 F (37.1 C),  temperature source Oral, resp. rate 18, height _0  (1.676 m), weight 92.5 kg (204 lb), SpO2 100 %.Body mass index is 32.93 kg/m.  General Appearance: Casual  Eye Contact:  Minimal  Speech:  Slow  Volume:  Decreased  Mood:  Depressed  Affect:  Blunt  Thought Process:  Goal Directed and Descriptions of Associations: Intact  Orientation:  Full (Time, Place, and Person)  Thought Content:  WDL  Suicidal Thoughts:  No  Homicidal Thoughts:  No  Memory:  Immediate;   Fair Recent;   Fair Remote;   Fair  Judgement:  Impaired  Insight:  Shallow  Psychomotor Activity:  Psychomotor Retardation  Concentration:  Concentration: Fair and Attention Span: Fair  Recall:  [employer] of Knowledge:  Fair  Language:  Fair  Akathisia:  No  Handed:  Right  AIMS (if indicated):     Assets:  Communication Skills Desire for Improvement Financial Resources/Insurance Housing Physical Health Resilience Social Support  ADL's:  Intact  Cognition:  WNL  Sleep:  Number of Hours: 6.5     Treatment Plan Summary: Daily contact with patient to assess and evaluate symptoms and progress in treatment and Medication management   Renee Turner is a 22 year old female with a history of treatment resistant depression admitted for ECT.  #Mood -continue Prozac 40 mg daily -continue Trazodone 50 mg nightly PRN -continue Vivactyl 10 mg TID -continue ECT on Wed  #DM -Metformin 1000 mg BID -Neurontin 300 mg TID  #Disposition -discharge to home -follow up with Dr. Gevena Mart, MD 05/17/2018, 11:46 AM

## 2018-05-16 NOTE — Anesthesia Preprocedure Evaluation (Addendum)
Anesthesia Evaluation  Patient identified by MRN, date of birth, ID band Patient awake    Reviewed: Allergy & Precautions, NPO status , Patient's Chart, lab work & pertinent test results  Airway Mallampati: II  TM Distance: >3 FB     Dental  (+) Teeth Intact   Pulmonary neg pulmonary ROS,    Pulmonary exam normal        Cardiovascular negative cardio ROS Normal cardiovascular exam     Neuro/Psych PSYCHIATRIC DISORDERS Anxiety Depression negative neurological ROS     GI/Hepatic negative GI ROS, Neg liver ROS,   Endo/Other  diabetes, Well Controlled, Type 2  Renal/GU   negative genitourinary   Musculoskeletal negative musculoskeletal ROS (+)   Abdominal Normal abdominal exam  (+)   Peds negative pediatric ROS (+)  Hematology negative hematology ROS (+)   Anesthesia Other Findings   Reproductive/Obstetrics                            Anesthesia Physical Anesthesia Plan  ASA: II  Anesthesia Plan: General   Post-op Pain Management:    Induction: Intravenous  PONV Risk Score and Plan:   Airway Management Planned: Mask  Additional Equipment:   Intra-op Plan:   Post-operative Plan:   Informed Consent: I have reviewed the patients History and Physical, chart, labs and discussed the procedure including the risks, benefits and alternatives for the proposed anesthesia with the patient or authorized representative who has indicated his/her understanding and acceptance.   Dental advisory given  Plan Discussed with: CRNA and Surgeon  Anesthesia Plan Comments:         Anesthesia Quick Evaluation

## 2018-05-16 NOTE — Plan of Care (Signed)
Patient oriented to unit. Patient's safety is maintained on unit. Patient denies SI/HI/AVH.     Problem: Safety: Goal: Ability to disclose and discuss suicidal ideas will improve Outcome: Progressing   Problem: Health Behavior/Discharge Planning: Goal: Ability to manage health-related needs will improve Outcome: Progressing   Problem: Coping: Goal: Level of anxiety will decrease Outcome: Progressing   Problem: Safety: Goal: Ability to remain free from injury will improve Outcome: Progressing

## 2018-05-16 NOTE — Anesthesia Postprocedure Evaluation (Signed)
Anesthesia Post Note  Patient: Renee Turner  Procedure(s) Performed: ECT TX  Patient location during evaluation: PACU Anesthesia Type: General Level of consciousness: awake and alert and oriented Pain management: pain level controlled Vital Signs Assessment: post-procedure vital signs reviewed and stable Respiratory status: spontaneous breathing Cardiovascular status: blood pressure returned to baseline Anesthetic complications: no     Last Vitals:  Vitals:   05/16/18 1116 05/16/18 1125  BP: (!) 90/47   Pulse: 99 88  Resp: 18 19  Temp:    SpO2: 97% 99%    Last Pain:  Vitals:   05/16/18 1125  TempSrc:   PainSc: 0-No pain                 Kamille Toomey

## 2018-05-16 NOTE — Anesthesia Procedure Notes (Signed)
Performed by: Shaila Gilchrest, CRNA Pre-anesthesia Checklist: Patient identified, Emergency Drugs available, Suction available and Patient being monitored Patient Re-evaluated:Patient Re-evaluated prior to induction Oxygen Delivery Method: Circle system utilized Preoxygenation: Pre-oxygenation with 100% oxygen Induction Type: IV induction Ventilation: Mask ventilation without difficulty and Mask ventilation throughout procedure Airway Equipment and Method: Bite block Placement Confirmation: positive ETCO2 Dental Injury: Teeth and Oropharynx as per pre-operative assessment        

## 2018-05-16 NOTE — Plan of Care (Signed)
Patient is calm and isolative to her room, cooperating with prescribed medications without any noticeable side effects, patient is encouraged to engage in leisure activities to improve mental and emotional state. Patient is sleeping long hours with out any interruptions, 15 minute safety checks is maintained no distress, patient denies any SI/HI no signs of AVH. Problem: Coping: Goal: Coping ability will improve Outcome: Progressing Goal: Will verbalize feelings Outcome: Progressing   Problem: Safety: Goal: Ability to disclose and discuss suicidal ideas will improve Outcome: Progressing Goal: Ability to identify and utilize support systems that promote safety will improve Outcome: Progressing   Problem: Education: Goal: Knowledge of [employer] General Education information/materials will improve Outcome: Progressing Goal: Emotional status will improve Outcome: Progressing Goal: Mental status will improve Outcome: Progressing Goal: Verbalization of understanding the information provided will improve Outcome: Progressing   Problem: Education: Goal: Knowledge of General Education information will improve Outcome: Progressing   Problem: Health Behavior/Discharge Planning: Goal: Ability to manage health-related needs will improve Outcome: Progressing   Problem: Clinical Measurements: Goal: Ability to maintain clinical measurements within normal limits will improve Outcome: Progressing Goal: Will remain free from infection Outcome: Progressing Goal: Diagnostic test results will improve Outcome: Progressing Goal: Respiratory complications will improve Outcome: Progressing Goal: Cardiovascular complication will be avoided Outcome: Progressing   Problem: Activity: Goal: Risk for activity intolerance will decrease Outcome: Progressing   Problem: Nutrition: Goal: Adequate nutrition will be maintained Outcome: Progressing   Problem: Coping: Goal: Level of anxiety will  decrease Outcome: Progressing   Problem: Elimination: Goal: Will not experience complications related to bowel motility Outcome: Progressing Goal: Will not experience complications related to urinary retention Outcome: Progressing   Problem: Pain Managment: Goal: General experience of comfort will improve Outcome: Progressing   Problem: Safety: Goal: Ability to remain free from injury will improve Outcome: Progressing   Problem: Skin Integrity: Goal: Risk for impaired skin integrity will decrease Outcome: Progressing

## 2018-05-16 NOTE — Transfer of Care (Signed)
Immediate Anesthesia Transfer of Care Note  Patient: Renee Turner  Procedure(s) Performed: ECT TX  Patient Location: PACU  Anesthesia Type:General  Level of Consciousness: awake, alert  and oriented  Airway & Oxygen Therapy: Patient Spontanous Breathing and Patient connected to face mask oxygen  Post-op Assessment: Report given to RN and Post -op Vital signs reviewed and stable  Post vital signs: Reviewed and stable  Last Vitals:  Vitals Value Taken Time  BP 105/66 05/16/2018 10:56 AM  Temp 36.6 C 05/16/2018 10:56 AM  Pulse 105 05/16/2018 10:56 AM  Resp 19 05/16/2018 10:56 AM  SpO2 95 % 05/16/2018 10:56 AM  Vitals shown include unvalidated device data.  Last Pain:  Vitals:   05/16/18 1056  TempSrc:   PainSc: Asleep         Complications: No apparent anesthesia complications

## 2018-05-16 NOTE — Progress Notes (Signed)
Recreation Therapy Notes  Date: 05/16/2018  Time: 9:30 am   Location: Craft Room   Behavioral response: N/A   Intervention Topic:  Team Work  Discussion/Intervention: Patient did not attend group.   Clinical Observations/Feedback:  Patient did not attend group.   Myrl Bynum LRT/CTRS        Jethro Radke 05/16/2018 10:16 AM

## 2018-05-16 NOTE — Progress Notes (Signed)
Patient has returned from ECT treatment. Patient is oriented x4, is enjoying lunch in dayroom and has no complaints of pain at this time. Nurse-tech reported to this nurse at hand-off that patient had complained about SOB, patient reported that it had resolved before she began eating. Will continue to monitor.

## 2018-05-16 NOTE — [facility] Group Notes (Signed)
05/16/2018 9:30AM/1PM  Type of Therapy and Topic:  Group Therapy:  Feelings around Relapse and Recovery  Participation Level:  Active   Description of Group:    Patients in this group will discuss emotions they experience before and after a relapse. They will process how experiencing these feelings, or avoidance of experiencing them, relates to having a relapse. Facilitator will guide patients to explore emotions they have related to recovery. Patients will be encouraged to process which emotions are more powerful. They will be guided to discuss the emotional reaction significant others in their lives may have to patients' relapse or recovery. Patients will be assisted in exploring ways to respond to the emotions of others without this contributing to a relapse.  Therapeutic Goals: 1. Patient will identify two or more emotions that lead to a relapse for them 2. Patient will identify two emotions that result when they relapse 3. Patient will identify two emotions related to recovery 4. Patient will demonstrate ability to communicate their needs through discussion and/or role plays   Summary of Patient Progress: Pt. Came outside to practice self-care as a concept related to recovery.     Therapeutic Modalities:   Cognitive Behavioral Therapy Solution-Focused Therapy Assertiveness Training Relapse Prevention Therapy   [name]  Anavey Coombes, LCSW 05/16/2018 1:51 PM

## 2018-05-16 NOTE — Anesthesia Post-op Follow-up Note (Signed)
Anesthesia QCDR form completed.        

## 2018-05-17 NOTE — [facility] Group Notes (Signed)
[facility] Group Notes:  (Nursing/MHT/Case Management/Adjunct)  Date:  05/17/2018  Time:  10:08 PM  Type of Therapy:  Group Therapy  Participation Level:  Active  Participation Quality:  Attentive  Affect:  Appropriate  Cognitive:  Appropriate  Insight:  Appropriate  Engagement in Group:  Engaged  Modes of Intervention:  Discussion  Summary of Progress/Problems: MHT addressed the messes that were left behind and encouraged patients to clean up after themselves. MHT reviewed visiting hours and the number of people allowed on the unit at one time. MHT informed patients of the phone hours. MHT informed patients of the time the day room would be shut down. MHT informed patients of wake-up call at 6am for vitals. MHT answered questions and concerns about the rules and expectations. MHT had patients introduce self and tell group what type of animal they would choose to be and why. MHT processed with group about decision making. MHT reviewed list of things to do when making decisions. MHT encouraged group to utilize the social workers while in group to address concerns and develop plans to address issue when released. MHT informed group they could be linked to providers once discharged. MHT informed group that IPRS funds were available for mental health services. MHT informed group they need to recognize a need exists to participate in treatment and medication management. Renee Turner participated in the activity. Renee Turner was cooperative and pleasant. Renee Turner was engaged throughout group. Jinger NeighborsKeith D Oscar Forman 05/17/2018, 10:08 PM

## 2018-05-17 NOTE — Progress Notes (Signed)
[facility] MD Progress Note  05/18/2018 7:52 AM Renee Turner  MRN:  [id number]  Subjective:    [name] reports no change. She is in her room in bed but gets up for me. She tried to socialize yesterday and was briefly out of her room yesterday. No program atendence. Sleep and appetite are good. In fact, she sleeps "all day long". There are no somatic complaits but she feels "tired".   Principal Problem: Severe recurrent major depression without psychotic features ([place]) Diagnosis:   Patient Active Problem List   Diagnosis Date Noted  . Severe recurrent major depression without psychotic features ([place]) [F33.2] 05/14/2018    Priority: High  . MDD (major depressive disorder), recurrent severe, without psychosis ([place]) [F33.2] 05/06/2018  . 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 Posttraumatic stress disorder [F43.10] 11/13/2016  . Severe episode of recurrent major depressive disorder, without psychotic features ([place]) [F33.2] 11/07/2016  . Diabetes ([place]) [E11.9] 11/07/2016  . Diabetic neuropathy associated with type 2 diabetes mellitus ([place]) [E11.40] 07/19/2016  . Obesity (BMI 30.0-34.9) [E66.9] 07/19/2016  . Type 2 diabetes mellitus with hyperglycemia, without long-term current use of insulin ([name]) [E11.65] 07/19/2016   Total Time spent with patient: 20 minutes  Past Psychiatric History: depression  Past Medical History:  Past Medical History:  Diagnosis Date  . Anxiety   . Depression   . Diabetes mellitus without complication ([place])    Type II  . Diabetes mellitus, type II ([place])    History reviewed. No pertinent surgical history. Family History: History reviewed. No pertinent family history. Family Psychiatric  History: none Social History:  Social History   Substance and Sexual Activity  Alcohol Use Yes     Social History   Substance and Sexual Activity  Drug Use No    Social History   Socioeconomic History  . Marital status: Single    Spouse name: Not on file  .  Number of children: Not on file  . Years of education: Not on file  . Highest education level: Not on file  Occupational History  . Not on file  Social Needs  . Financial resource strain: Not on file  . Food insecurity:    Worry: Not on file    Inability: Not on file  . Transportation needs:    Medical: Not on file    Non-medical: Not on file  Tobacco Use  . Smoking status: Never Smoker  . Smokeless tobacco: Never Used  Substance and Sexual Activity  . Alcohol use: Yes  . Drug use: No  . Sexual activity: Yes    Birth control/protection: None  Lifestyle  . Physical activity:    Days per week: Not on file    Minutes per session: Not on file  . Stress: Not on file  Relationships  . Social connections:    Talks on phone: Not on file    Gets together: Not on file    Attends religious service: Not on file    Active member of club or organization: Not on file    Attends meetings of clubs or organizations: Not on file    Relationship status: Not on file  Other Topics Concern  . Not on file  Social History Narrative  . Not on file   Additional Social History:                         Sleep: Fair  Appetite:  Fair  Current Medications: Current Facility-Administered Medications  Medication Dose Route Frequency Provider Last Rate Last Dose  . acetaminophen (TYLENOL) tablet 650 mg  650 mg Oral Q6H PRN Clapacs, Madie Reno, MD   650 mg at 05/16/18 2115  . alum & mag hydroxide-simeth (MAALOX/MYLANTA) 200-200-20 MG/5ML suspension 30 mL  30 mL Oral Q4H PRN Clapacs, John T, MD      . exenatide (BYETTA) immediate release injection  5 mcg Subcutaneous BID WC Clapacs, Madie Reno, MD   Stopped at 05/16/18 [phone]  . ferrous sulfate tablet 325 mg  325 mg Oral Q breakfast Clapacs, Madie Reno, MD   325 mg at 05/17/18 0850  . FLUoxetine (PROZAC) capsule 40 mg  40 mg Oral Daily Clapacs, Madie Reno, MD   40 mg at 05/17/18 0850  . gabapentin (NEURONTIN) capsule 300 mg  300 mg Oral TID Clapacs, John T,  MD   300 mg at 05/17/18 1653  . hydrOXYzine (ATARAX/VISTARIL) tablet 25 mg  25 mg Oral TID PRN Clapacs, Madie Reno, MD   25 mg at 05/17/18 2154  . magnesium hydroxide (MILK OF MAGNESIA) suspension 30 mL  30 mL Oral Daily PRN Clapacs, John T, MD      . metFORMIN (GLUCOPHAGE-XR) 24 hr tablet 1,000 mg  1,000 mg Oral BID WC Clapacs, John T, MD   1,000 mg at 05/17/18 1651  . protriptyline (VIVACTIL) tablet 10 mg  10 mg Oral TID Clapacs, Madie Reno, MD   Stopped at 05/16/18 [phone]  . traZODone (DESYREL) tablet 50 mg  50 mg Oral QHS PRN Clapacs, Madie Reno, MD   50 mg at 05/17/18 2154    Lab Results:  Results for orders placed or performed during the hospital encounter of 05/16/18 (from the past 48 hour(s))  Glucose, capillary     Status: Abnormal   Collection Time: 05/16/18 11:23 AM  Result Value Ref Range   Glucose-Capillary 106 (H) 70 - 99 mg/dL    Blood Alcohol level:  Lab Results  Component Value Date   ETH <5 63/11/6008    Metabolic Disorder Labs: Lab Results  Component Value Date   HGBA1C 6.5 (H) 05/07/2018   MPG 139.85 05/07/2018   No results found for: PROLACTIN Lab Results  Component Value Date   CHOL 181 05/07/2018   TRIG 92 05/07/2018   HDL 46 05/07/2018   CHOLHDL 3.9 05/07/2018   VLDL 18 05/07/2018   LDLCALC 117 (H) 05/07/2018    Physical Findings: AIMS: Facial and Oral Movements Muscles of Facial Expression: None, normal Lips and Perioral Area: None, normal Jaw: None, normal Tongue: None, normal,Extremity Movements Upper (arms, wrists, hands, fingers): None, normal Lower (legs, knees, ankles, toes): None, normal, Trunk Movements Neck, shoulders, hips: None, normal, Overall Severity Severity of abnormal movements (highest score from questions above): None, normal Incapacitation due to abnormal movements: None, normal Patient's awareness of abnormal movements (rate only patient's report): No Awareness, Dental Status Current problems with teeth and/or dentures?: No Does  patient usually wear dentures?: No  CIWA:    COWS:     Musculoskeletal: Strength & Muscle Tone: within normal limits Gait & Station: normal Patient leans: N/A  Psychiatric Specialty Exam: Physical Exam  Nursing note and vitals reviewed. Psychiatric: Judgment and thought content normal. Her affect is blunt. Her speech is delayed. She is slowed and withdrawn. Cognition and memory are impaired. She exhibits a depressed mood.    Review of Systems  Neurological: Negative.   Psychiatric/Behavioral: Positive for depression.  All other systems reviewed and are negative.   Blood  pressure 98/64, pulse (!) 127, temperature 98.8 F (37.1 C), temperature source Oral, resp. rate 18, height _0  (1.676 m), weight 92.5 kg (204 lb), SpO2 100 %.Body mass index is 32.93 kg/m.  General Appearance: Casual  Eye Contact:  Fair  Speech:  Slow  Volume:  Decreased  Mood:  Depressed  Affect:  Flat  Thought Process:  Goal Directed and Descriptions of Associations: Intact  Orientation:  Full (Time, Place, and Person)  Thought Content:  WDL  Suicidal Thoughts:  No  Homicidal Thoughts:  No  Memory:  Immediate;   Fair Recent;   Fair Remote;   Fair  Judgement:  Fair  Insight:  Fair  Psychomotor Activity:  Psychomotor Retardation  Concentration:  Concentration: Fair and Attention Span: Fair  Recall:  [employer] of Knowledge:  Fair  Language:  Fair  Akathisia:  No  Handed:  Right  AIMS (if indicated):     Assets:  Communication Skills Desire for Improvement Financial Resources/Insurance Housing Physical Health Resilience Social Support  ADL's:  Intact  Cognition:  WNL  Sleep:  Number of Hours: 5.15     Treatment Plan Summary: Daily contact with patient to assess and evaluate symptoms and progress in treatment and Medication management   Renee Turner is a 22 year old female with a history of treatment resistant depression admitted for ECT.  #Mood -continue Prozac 40 mg daily -continue  Trazodone 50 mg nightly PRN -continue Vivactyl 10 mg TID -continue ECT on Wed  #Tachycardia of 127 with low BP -encourage fluids  #DM -Metformin 1000 mg BID -Neurontin 300 mg TID  #Disposition -discharge to home -follow up with Dr. Gevena Mart, MD 05/18/2018, 7:52 AM

## 2018-05-17 NOTE — [facility] Group Notes (Addendum)
[facility]  Date/Time:  05/17/2018 1PM  Type of Therapy and Topic:  Group Therapy:  Healthy and Unhealthy Supports  Participation Level:  Active   Description of Group:  Patients in this group were introduced to the idea of adding a variety of healthy supports to address the various needs in their lives.Patients discussed what additional healthy supports could be helpful in their recovery and wellness after discharge in order to prevent future hospitalizations.   An emphasis was placed on using counselor, doctor, therapy groups, 12-step groups, and problem-specific support groups to expand supports.  They also worked as a group on developing a specific plan for several patients to deal with unhealthy supports through boundary-setting, psychoeducation with loved ones, and even termination of relationships.   Therapeutic Goals:   1)  discuss importance of adding supports to stay well once out of the hospital  2)  compare healthy versus unhealthy supports and identify some examples of each  3)  generate ideas and descriptions of healthy supports that can be added  4)  offer mutual support about how to address unhealthy supports  5)  encourage active participation in and adherence to discharge plan    Summary of Patient Progress:  The patient stated that current healthy supports in her life are her music teacher and her friend while current unhealthy supports include her parents.  The patient expressed a willingness to add horseback riding as support(s) to help in her recovery journey.  Therapeutic Modalities:   Motivational Interviewing Brief Solution-Focused Therapy  [name]  Renee Mackowski, LCSW 05/17/2018 2:01PM

## 2018-05-17 NOTE — Plan of Care (Signed)
Pt continues to progress towards goals and d/c. RN will continue to monitor.  

## 2018-05-17 NOTE — Progress Notes (Signed)
[facility] MD Progress Note  05/17/2018 1:15 AM Renee Turner  MRN:  [id number] Subjective: 22 year old woman with severe major depression transferred [facility] for [facility].  EKG was obtained and was normal.  Chest x-ray normal.  Patient had right unilateral ECT treatment this morning which was done without any complication.  Patient continues to endorse depressed mood low energy hopelessness.  Does not report psychotic symptoms.  Not reporting active suicidal intent or plan.  No new physical complaints.  Blood sugars reviewed and are staying very normal with current treatment. Principal Problem: Severe recurrent major depression without psychotic features ([place]) Diagnosis:   Patient Active Problem List   Diagnosis Date Noted  . Severe recurrent major depression without psychotic features ([place]) [F33.2] 05/14/2018  . MDD (major depressive disorder), recurrent severe, without psychosis ([place]) [F33.2] 05/06/2018  . 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 Posttraumatic stress disorder [F43.10] 11/13/2016  . Severe episode of recurrent major depressive disorder, without psychotic features ([place]) [F33.2] 11/07/2016  . Diabetes ([place]) [E11.9] 11/07/2016  . Diabetic neuropathy associated with type 2 diabetes mellitus ([place]) [E11.40] 07/19/2016  . Obesity (BMI 30.0-34.9) [E66.9] 07/19/2016  . Type 2 diabetes mellitus with hyperglycemia, without long-term current use of insulin ([name]) [E11.65] 07/19/2016   Total Time spent with patient: 30 minutes  Past Psychiatric History: Patient has a long history of depression and PTSD with partial response at best to medication  Past Medical History:  Past Medical History:  Diagnosis Date  . Anxiety   . Depression   . Diabetes mellitus without complication ([place])    Type II  . Diabetes mellitus, type II ([place])    History reviewed. No pertinent surgical history. Family History: History reviewed. No pertinent family history. Family Psychiatric   History: See previous note Social History:  Social History   Substance and Sexual Activity  Alcohol Use Yes     Social History   Substance and Sexual Activity  Drug Use No    Social History   Socioeconomic History  . Marital status: Single    Spouse name: Not on file  . Number of children: Not on file  . Years of education: Not on file  . Highest education level: Not on file  Occupational History  . Not on file  Social Needs  . Financial resource strain: Not on file  . Food insecurity:    Worry: Not on file    Inability: Not on file  . Transportation needs:    Medical: Not on file    Non-medical: Not on file  Tobacco Use  . Smoking status: Never Smoker  . Smokeless tobacco: Never Used  Substance and Sexual Activity  . Alcohol use: Yes  . Drug use: No  . Sexual activity: Yes    Birth control/protection: None  Lifestyle  . Physical activity:    Days per week: Not on file    Minutes per session: Not on file  . Stress: Not on file  Relationships  . Social connections:    Talks on phone: Not on file    Gets together: Not on file    Attends religious service: Not on file    Active member of club or organization: Not on file    Attends meetings of clubs or organizations: Not on file    Relationship status: Not on file  Other Topics Concern  . Not on file  Social History Narrative  . Not on file   Additional Social History:  Sleep: Fair  Appetite:  Fair  Current Medications: Current Facility-Administered Medications  Medication Dose Route Frequency Provider Last Rate Last Dose  . acetaminophen (TYLENOL) tablet 650 mg  650 mg Oral Q6H PRN Clapacs, Madie Reno, MD   650 mg at 05/16/18 2115  . alum & mag hydroxide-simeth (MAALOX/MYLANTA) 200-200-20 MG/5ML suspension 30 mL  30 mL Oral Q4H PRN Clapacs, John T, MD      . exenatide (BYETTA) immediate release injection  5 mcg Subcutaneous BID WC Clapacs, Madie Reno, MD   Stopped at 05/16/18  [phone]  . ferrous sulfate tablet 325 mg  325 mg Oral Q breakfast Clapacs, Madie Reno, MD   325 mg at 05/16/18 1246  . FLUoxetine (PROZAC) capsule 40 mg  40 mg Oral Daily Clapacs, Madie Reno, MD   40 mg at 05/16/18 1248  . gabapentin (NEURONTIN) capsule 300 mg  300 mg Oral TID Clapacs, John T, MD   300 mg at 05/16/18 2121  . hydrOXYzine (ATARAX/VISTARIL) tablet 25 mg  25 mg Oral TID PRN Clapacs, Madie Reno, MD      . magnesium hydroxide (MILK OF MAGNESIA) suspension 30 mL  30 mL Oral Daily PRN Clapacs, John T, MD      . metFORMIN (GLUCOPHAGE-XR) 24 hr tablet 1,000 mg  1,000 mg Oral BID WC Clapacs, John T, MD   1,000 mg at 05/16/18 2120  . protriptyline (VIVACTIL) tablet 10 mg  10 mg Oral TID Clapacs, Madie Reno, MD   Stopped at 05/16/18 [phone]  . traZODone (DESYREL) tablet 50 mg  50 mg Oral QHS PRN Clapacs, Madie Reno, MD   50 mg at 05/16/18 2115   Facility-Administered Medications Ordered in Other Encounters  Medication Dose Route Frequency Provider Last Rate Last Dose  . ondansetron (ZOFRAN) injection 4 mg  4 mg Intravenous Once PRN Alvin Critchley, MD        Lab Results:  Results for orders placed or performed during the hospital encounter of 05/14/18 (from the past 48 hour(s))  Glucose, capillary     Status: None   Collection Time: 05/16/18  7:07 AM  Result Value Ref Range   Glucose-Capillary 91 70 - 99 mg/dL   Comment 1 Notify RN     Blood Alcohol level:  Lab Results  Component Value Date   ETH <5 52/84/1324    Metabolic Disorder Labs: Lab Results  Component Value Date   HGBA1C 6.5 (H) 05/07/2018   MPG 139.85 05/07/2018   No results found for: PROLACTIN Lab Results  Component Value Date   CHOL 181 05/07/2018   TRIG 92 05/07/2018   HDL 46 05/07/2018   CHOLHDL 3.9 05/07/2018   VLDL 18 05/07/2018   LDLCALC 117 (H) 05/07/2018    Physical Findings: AIMS:  , ,  ,  ,    CIWA:    COWS:     Musculoskeletal: Strength & Muscle Tone: within normal limits Gait & Station: normal Patient leans:  N/A  Psychiatric Specialty Exam: Physical Exam  Nursing note and vitals reviewed. Constitutional: She appears well-developed and well-nourished.  HENT:  Head: Normocephalic and atraumatic.  Eyes: Pupils are equal, round, and reactive to light. Conjunctivae are normal.  Neck: Normal range of motion.  Cardiovascular: Regular rhythm and normal heart sounds.  Respiratory: Effort normal. No respiratory distress.  GI: Soft.  Musculoskeletal: Normal range of motion.  Neurological: She is alert.  Skin: Skin is warm and dry.  Psychiatric: Judgment normal. Her affect is blunt. Her speech is delayed. She  is slowed. Cognition and memory are normal. She expresses suicidal ideation. She expresses no suicidal plans.    Review of Systems  Constitutional: Negative.   HENT: Negative.   Eyes: Negative.   Respiratory: Negative.   Cardiovascular: Negative.   Gastrointestinal: Negative.   Musculoskeletal: Negative.   Skin: Negative.   Neurological: Negative.   Psychiatric/Behavioral: Positive for depression. Negative for hallucinations, substance abuse and suicidal ideas. The patient is nervous/anxious.     Blood pressure 90/60, pulse (!) 102, temperature 98.6 F (37 C), temperature source Oral, resp. rate 18, height '5\' 6"'  (1.676 m), weight 92.5 kg (204 lb), SpO2 100 %.Body mass index is 32.93 kg/m.  General Appearance: Fairly Groomed  Eye Contact:  Good  Speech:  Slow  Volume:  Decreased  Mood:  Depressed  Affect:  Appropriate  Thought Process:  Coherent  Orientation:  Full (Time, Place, and Person)  Thought Content:  Logical  Suicidal Thoughts:  Yes.  without intent/plan  Homicidal Thoughts:  No  Memory:  Immediate;   Fair Recent;   Fair Remote;   Fair  Judgement:  Fair  Insight:  Fair  Psychomotor Activity:  Decreased  Concentration:  Concentration: Fair  Recall:  [employer] of Knowledge:  Fair  Language:  Fair  Akathisia:  No  Handed:  Right  AIMS (if indicated):     Assets:   Communication Skills  ADL's:  Intact  Cognition:  WNL  Sleep:  Number of Hours: 6.5     Treatment Plan Summary: Daily contact with patient to assess and evaluate symptoms and progress in treatment, Medication management and Plan Patient will continue with ECT.  Next treatment will be scheduled for next Wednesday because of unavoidable scheduling difficulties patient will continue inclusion in groups and activities.  Continue current medication management.  Patient remains to depressed and withdrawn and nonfunctional for discharge planning at this time.Alethia Berthold, MD 05/17/2018, 1:15 AM

## 2018-05-17 NOTE — Plan of Care (Signed)
Patient is calm and responding well to ECT treatment , affect is bright , appetite is moderate, well hydrated with fluids and juices, no noticeable side effect from the procedure, relaxed and comfortable, sleep long hours and restful no distress, denies any SI/HI/AVH ,15 minutes safety rounding is maintained. Problem: Coping: Goal: Coping ability will improve Outcome: Progressing Goal: Will verbalize feelings Outcome: Progressing   Problem: Safety: Goal: Ability to disclose and discuss suicidal ideas will improve Outcome: Progressing Goal: Ability to identify and utilize support systems that promote safety will improve Outcome: Progressing   Problem: Education: Goal: Knowledge of [place] [facility] Education information/materials will improve Outcome: Progressing Goal: Emotional status will improve Outcome: Progressing Goal: Mental status will improve Outcome: Progressing Goal: Verbalization of understanding the information provided will improve Outcome: Progressing   Problem: Education: Goal: Knowledge of General Education information will improve Outcome: Progressing   Problem: Health Behavior/Discharge Planning: Goal: Ability to manage health-related needs will improve Outcome: Progressing   Problem: Clinical Measurements: Goal: Ability to maintain clinical measurements within normal limits will improve Outcome: Progressing Goal: Will remain free from infection Outcome: Progressing Goal: Diagnostic test results will improve Outcome: Progressing Goal: Respiratory complications will improve Outcome: Progressing Goal: Cardiovascular complication will be avoided Outcome: Progressing   Problem: Activity: Goal: Risk for activity intolerance will decrease Outcome: Progressing   Problem: Nutrition: Goal: Adequate nutrition will be maintained Outcome: Progressing   Problem: Coping: Goal: Level of anxiety will decrease Outcome: Progressing   Problem:  Elimination: Goal: Will not experience complications related to bowel motility Outcome: Progressing Goal: Will not experience complications related to urinary retention Outcome: Progressing   Problem: Pain Managment: Goal: General experience of comfort will improve Outcome: Progressing   Problem: Safety: Goal: Ability to remain free from injury will improve Outcome: Progressing   Problem: Skin Integrity: Goal: Risk for impaired skin integrity will decrease Outcome: Progressing

## 2018-05-17 NOTE — Plan of Care (Signed)
Patient is alert, very flat and isolated to room. Patient appetite has been poor, but this morning patient did eat breakfast. Patient denies SI, HI and AVH. Patient affect is very depressed, slow movements. Patient is taking medications except for Byetta  and  Vivactil which are not available via pharmacy.  Patient is knowledgeable about medications, able to discuss feelings and mental status. Problem: Coping: Goal: Coping ability will improve Outcome: Not Progressing Goal: Will verbalize feelings Outcome: Not Progressing   Problem: Safety: Goal: Ability to disclose and discuss suicidal ideas will improve Outcome: Not Progressing Goal: Ability to identify and utilize support systems that promote safety will improve Outcome: Not Progressing   Problem: Education: Goal: Knowledge of [place] [facility] Education information/materials will improve Outcome: Not Progressing Goal: Emotional status will improve Outcome: Not Progressing Goal: Mental status will improve Outcome: Not Progressing Goal: Verbalization of understanding the information provided will improve Outcome: Not Progressing   Problem: Health Behavior/Discharge Planning: Goal: Ability to manage health-related needs will improve Outcome: Not Progressing

## 2018-05-18 NOTE — [facility] Group Notes (Signed)
LCSW Group Therapy Note   05/18/2018 1:15pm   Type of Therapy and Topic:  Group Therapy:  Positive Affirmations   Participation Level:  Did Not Attend  Description of Group: This group addressed positive affirmation toward self and others. Patients went around the room and identified two positive things about themselves and two positive things about a peer in the room. Patients reflected on how it felt to share something positive with others, to identify positive things about themselves, and to hear positive things from others. Patients were encouraged to have a daily reflection of positive characteristics or circumstances.  Therapeutic Goals 1. Patient will verbalize two of their positive qualities 2. Patient will demonstrate empathy for others by stating two positive qualities about a peer in the group 3. Patient will verbalize their feelings when voicing positive self affirmations and when voicing positive affirmations of others 4. Patients will discuss the potential positive impact on their wellness/recovery of focusing on positive traits of self and others. Summary of Patient Progress:    Therapeutic Modalities Cognitive Behavioral Therapy Motivational Interviewing  Ida RogueRodney B Cumi [place], [place]CS[place] 05/18/2018 2:49 PM

## 2018-05-18 NOTE — Progress Notes (Signed)
[facility] Group Notes:  (Nursing/MHT/Case Management/Adjunct)  Date:  05/18/2018  Time:  9:15 PM  Type of Therapy:  Wrap up Group   Participation Level:  Active  Participation Quality:  Appropriate, Attentive and Sharing  Affect:  Appropriate  Cognitive:  Appropriate and Oriented  Insight:  Appropriate and Good  Engagement in Group:  Engaged  Modes of Intervention:  Wrap up group   Summary of Progress/Problems: Azzie AlmasKarial shared in group tonight how her day was a 7 out of 10.  She continued to say she slept majority of today and she is expecting tomorrow to be a better day.    Annell GreeningMonroe, Beatrix Breece Emersonasina 05/18/2018, 9:15 PM

## 2018-05-18 NOTE — Progress Notes (Signed)
Nursing note 7p-7a  Pt observed interacting with peers on unit this shift. Displayed a flat affect and pleasant mood upon interaction with this Clinical research associatewriter. Pt denies pain ,denies SI/HI, and also denies any audio or visual hallucinations at this time. Pt is able to verbally contract for safety with this RN. Goal: "get out of my room and socialize" Pt is now resting in bed with eyes closed, with no signs or symptoms of pain or distress noted. Pt continues to remain safe on the unit and is observed by rounding every 15 min. RN will continue to monitor.

## 2018-05-18 NOTE — Progress Notes (Signed)
Patient was not given two scheduled medications because they are not available, they need to be provided from home.

## 2018-05-18 NOTE — Progress Notes (Signed)
Patient was not given her scheduled Vivactil because it is not available in hospital pharmacy and has not been provided from home.

## 2018-05-18 NOTE — Plan of Care (Signed)
Pt continues to progress towards goals and d/c. RN will continue to monitor.  

## 2018-05-18 NOTE — Progress Notes (Signed)
D- Patient alert and oriented. Patient presents in a sad/sullen mood on assessment stating that she slept "good" last night and had no major complaints. Patient stated to this writer that overall she feels "fine". Patient denies SI, HI, AVH, and pain at this time. Patient also denies any signs/symptoms of depression/anxiety.  Patient's goal for today is to "try to get out of my room more, but the medication they gave me last night still has me tired".  A- Scheduled medications administered to patient, per MD orders. Support and encouragement provided.  Routine safety checks conducted every 15 minutes.  Patient informed to notify staff with problems or concerns.  R- No adverse drug reactions noted. Patient contracts for safety at this time. Patient compliant with medications and treatment plan. Patient receptive, calm, and cooperative. Patient interacts well with others on the unit.  Patient remains safe at this time.

## 2018-05-18 NOTE — Progress Notes (Signed)
This writer notified MD about administering medications to patient late and patient is scheduled the same medications at noon, so MD stated to this writer to hold noon medication. 

## 2018-05-18 NOTE — Progress Notes (Signed)
Nursing note 7p-7a  Pt observed interacting with peers on unit this shift. Displayed a flat affect and depressed mood upon interaction with this Clinical research associatewriter. Pt deies pain ,denies SI/HI, and also denies any audio or visual hallucinations at this time.Pt complains of having some constipation and insomnia. See MAR for PR pedication administration. Pt is able to verbally contract for safety with this RN. Goal: " to try to get out of my room more." Pt is now resting in bed with eyes closed, with no signs or symptoms of pain or distress noted. Pt continues to remain safe on the unit and is observed by rounding every 15 min. RN will continue to monitor.

## 2018-05-18 NOTE — Progress Notes (Signed)
[facility] MD Progress Note  05/19/2018 2:05 PM Renee Turner  MRN:  [id number]  Subjective:    Renee Turner is resting in her room as usual this morning. She gets up to talk to me. She mostly "sleeps". In the afternoon, however she gets up and tries her best to socialize. She has no complaints, just "sad". Next ECT on 7/17  Principal Problem: Severe recurrent major depression without psychotic features [facility]) Diagnosis:   Patient Active Problem List   Diagnosis Date Noted  . Severe recurrent major depression without psychotic features ([place]) [F33.2] 05/14/2018    Priority: High  . MDD (major depressive disorder), recurrent severe, without psychosis ([place]) [F33.2] 05/06/2018  . 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 Posttraumatic stress disorder [F43.10] 11/13/2016  . Severe episode of recurrent major depressive disorder, without psychotic features ([place]) [F33.2] 11/07/2016  . Diabetes ([place]) [E11.9] 11/07/2016  . Diabetic neuropathy associated with type 2 diabetes mellitus ([place]) [E11.40] 07/19/2016  . Obesity (BMI 30.0-34.9) [E66.9] 07/19/2016  . Type 2 diabetes mellitus with hyperglycemia, without long-term current use of insulin ([name]) [E11.65] 07/19/2016   Total Time spent with patient: 20 minutes  Past Psychiatric History: depression  Past Medical History:  Past Medical History:  Diagnosis Date  . Anxiety   . Depression   . Diabetes mellitus without complication ([place])    Type II  . Diabetes mellitus, type II ([place])    History reviewed. No pertinent surgical history. Family History: History reviewed. No pertinent family history. Family Psychiatric  History: none Social History:  Social History   Substance and Sexual Activity  Alcohol Use Yes     Social History   Substance and Sexual Activity  Drug Use No    Social History   Socioeconomic History  . Marital status: Single    Spouse name: Not on file  . Number of children: Not on file  . Years of education: Not on  file  . Highest education level: Not on file  Occupational History  . Not on file  Social Needs  . Financial resource strain: Not on file  . Food insecurity:    Worry: Not on file    Inability: Not on file  . Transportation needs:    Medical: Not on file    Non-medical: Not on file  Tobacco Use  . Smoking status: Never Smoker  . Smokeless tobacco: Never Used  Substance and Sexual Activity  . Alcohol use: Yes  . Drug use: No  . Sexual activity: Yes    Birth control/protection: None  Lifestyle  . Physical activity:    Days per week: Not on file    Minutes per session: Not on file  . Stress: Not on file  Relationships  . Social connections:    Talks on phone: Not on file    Gets together: Not on file    Attends religious service: Not on file    Active member of club or organization: Not on file    Attends meetings of clubs or organizations: Not on file    Relationship status: Not on file  Other Topics Concern  . Not on file  Social History Narrative  . Not on file   Additional Social History:                         Sleep: Fair  Appetite:  Fair  Current Medications: Current Facility-Administered Medications  Medication Dose Route Frequency Provider Last Rate Last Dose  .  acetaminophen (TYLENOL) tablet 650 mg  650 mg Oral Q6H PRN Clapacs, Madie Reno, MD   650 mg at 05/16/18 2115  . alum & mag hydroxide-simeth (MAALOX/MYLANTA) 200-200-20 MG/5ML suspension 30 mL  30 mL Oral Q4H PRN Clapacs, John T, MD      . exenatide (BYETTA) immediate release injection  5 mcg Subcutaneous BID WC Clapacs, Madie Reno, MD   Stopped at 05/16/18 [phone]  . ferrous sulfate tablet 325 mg  325 mg Oral Q breakfast Clapacs, Madie Reno, MD   325 mg at 05/19/18 0750  . FLUoxetine (PROZAC) capsule 40 mg  40 mg Oral Daily Clapacs, Madie Reno, MD   40 mg at 05/19/18 0750  . gabapentin (NEURONTIN) capsule 300 mg  300 mg Oral TID Clapacs, John T, MD   300 mg at 05/19/18 1151  . hydrOXYzine (ATARAX/VISTARIL)  tablet 25 mg  25 mg Oral TID PRN Clapacs, Madie Reno, MD   25 mg at 05/17/18 2154  . magnesium hydroxide (MILK OF MAGNESIA) suspension 30 mL  30 mL Oral Daily PRN Clapacs, Madie Reno, MD   30 mL at 05/18/18 2219  . metFORMIN (GLUCOPHAGE-XR) 24 hr tablet 1,000 mg  1,000 mg Oral BID WC Clapacs, John T, MD   1,000 mg at 05/19/18 0750  . protriptyline (VIVACTIL) tablet 10 mg  10 mg Oral TID Clapacs, Madie Reno, MD   Stopped at 05/16/18 [phone]  . traZODone (DESYREL) tablet 50 mg  50 mg Oral QHS PRN Clapacs, Madie Reno, MD   50 mg at 05/18/18 2219    Lab Results: No results found for this or any previous visit (from the past 32 hour(s)).  Blood Alcohol level:  Lab Results  Component Value Date   ETH <5 22/63/3354    Metabolic Disorder Labs: Lab Results  Component Value Date   HGBA1C 6.5 (H) 05/07/2018   MPG 139.85 05/07/2018   No results found for: PROLACTIN Lab Results  Component Value Date   CHOL 181 05/07/2018   TRIG 92 05/07/2018   HDL 46 05/07/2018   CHOLHDL 3.9 05/07/2018   VLDL 18 05/07/2018   LDLCALC 117 (H) 05/07/2018    Physical Findings: AIMS: Facial and Oral Movements Muscles of Facial Expression: None, normal Lips and Perioral Area: None, normal Jaw: None, normal Tongue: None, normal,Extremity Movements Upper (arms, wrists, hands, fingers): None, normal Lower (legs, knees, ankles, toes): None, normal, Trunk Movements Neck, shoulders, hips: None, normal, Overall Severity Severity of abnormal movements (highest score from questions above): None, normal Incapacitation due to abnormal movements: None, normal Patient's awareness of abnormal movements (rate only patient's report): No Awareness, Dental Status Current problems with teeth and/or dentures?: No Does patient usually wear dentures?: No  CIWA:    COWS:     Musculoskeletal: Strength & Muscle Tone: within normal limits Gait & Station: normal Patient leans: N/A  Psychiatric Specialty Exam: Physical Exam  Nursing note and  vitals reviewed. Psychiatric: Judgment and thought content normal. Her affect is blunt. Her speech is delayed. She is slowed and withdrawn. Cognition and memory are impaired. She exhibits a depressed mood.    Review of Systems  Neurological: Negative.   Psychiatric/Behavioral: Positive for depression.  All other systems reviewed and are negative.   Blood pressure 97/67, pulse (!) 138, temperature 98.5 F (36.9 C), temperature source Oral, resp. rate 18, height '5\' 6"'  (1.676 m), weight 92.5 kg (204 lb), SpO2 100 %.Body mass index is 32.93 kg/m.  General Appearance: Casual  Eye Contact:  Good  Speech:  Slow  Volume:  Decreased  Mood:  Depressed  Affect:  Blunt  Thought Process:  Goal Directed and Descriptions of Associations: Intact  Orientation:  Full (Time, Place, and Person)  Thought Content:  WDL  Suicidal Thoughts:  No  Homicidal Thoughts:  No  Memory:  Immediate;   Fair Recent;   Fair Remote;   Fair  Judgement:  Fair  Insight:  Fair  Psychomotor Activity:  Psychomotor Retardation  Concentration:  Concentration: Fair and Attention Span: Fair  Recall:  [employer] of Knowledge:  Fair  Language:  Fair  Akathisia:  No  Handed:  Right  AIMS (if indicated):     Assets:  Communication Skills Desire for Improvement Financial Resources/Insurance Housing Physical Health Resilience Social Support  ADL's:  Intact  Cognition:  WNL  Sleep:  Number of Hours: 6     Treatment Plan Summary: Daily contact with patient to assess and evaluate symptoms and progress in treatment and Medication management   Renee Turner is a 22 year old female with a history of treatment resistant depression admitted for ECT.  #Mood -continue Prozac 40 mg daily -continue Trazodone 50 mg nightly PRN -continue Vivactyl 10 mg TID -continue ECT on Wed  #Tachycardia of 127 with low BP -encourage fluids  #DM -Metformin 1000 mg BID -Neurontin 300 mg TID  #Disposition -discharge to home -follow  up with Dr. Gevena Mart, MD 05/19/2018, 2:05 PM

## 2018-05-19 LAB — GLUCOSE, CAPILLARY: GLUCOSE-CAPILLARY: 88 mg/dL (ref 70–99)

## 2018-05-19 NOTE — Progress Notes (Signed)
Recreation Therapy Notes  Date: 05/19/2018  Time: 9:30 am   Location: Craft Room   Behavioral response: N/A   Intervention Topic:  Stress  Discussion/Intervention: Patient did not attend group.   Clinical Observations/Feedback:  Patient did not attend group.   Deira Shimer LRT/CTRS        Renee Turner 05/19/2018 11:43 AM 

## 2018-05-19 NOTE — [facility] Group Notes (Signed)
[facility] Group Notes:  (Nursing/MHT/Case Management/Adjunct)  Date:  05/19/2018  Time:  9:19 PM  Type of Therapy:  Group Therapy  Participation Level:  Did Not Attend   Renee Turner  Renee Turner 05/19/2018, 9:19 PM

## 2018-05-19 NOTE — Progress Notes (Signed)
D: Patient stated slept good last night .Stated appetite is good and energy level  Is normal. Stated concentration is good . Stated on Depression scale 2 , hopeless 0 and anxiety 2 .( low 0-10 high) Denies suicidal  homicidal ideations  .  No auditory hallucinations  No pain concerns . Appropriate ADL'S. Interacting with peers and staff. Focus today go to group. Noted to isolate from peers and staff through out shift .  A: Encourage patient participation with unit programming . Instruction  Given on  Medication , verbalize understanding.  R: Voice no other concerns. Staff continue to monitor

## 2018-05-19 NOTE — Tx Team (Signed)
Interdisciplinary Treatment and Diagnostic Plan Update  05/19/2018 Time of Session: 0800 Renee Turner MRN: [id number]  Principal Diagnosis: Severe recurrent major depression without psychotic features [facility])  Secondary Diagnoses: Principal Problem:   Severe recurrent major depression without psychotic features ([place]) Active Problems:   Diabetes ([place])   Posttraumatic stress disorder   Diabetic neuropathy associated with type 2 diabetes mellitus ([place])   Current Medications:  Current Facility-Administered Medications  Medication Dose Route Frequency Provider Last Rate Last Dose  . acetaminophen (TYLENOL) tablet 650 mg  650 mg Oral Q6H PRN Clapacs, Madie Reno, MD   650 mg at 05/16/18 2115  . alum & mag hydroxide-simeth (MAALOX/MYLANTA) 200-200-20 MG/5ML suspension 30 mL  30 mL Oral Q4H PRN Clapacs, John T, MD      . exenatide (BYETTA) immediate release injection  5 mcg Subcutaneous BID WC Clapacs, Madie Reno, MD   Stopped at 05/16/18 [phone]  . ferrous sulfate tablet 325 mg  325 mg Oral Q breakfast Clapacs, Madie Reno, MD   325 mg at 05/19/18 0750  . FLUoxetine (PROZAC) capsule 40 mg  40 mg Oral Daily Clapacs, Madie Reno, MD   40 mg at 05/19/18 0750  . gabapentin (NEURONTIN) capsule 300 mg  300 mg Oral TID Clapacs, Madie Reno, MD   300 mg at 05/19/18 0750  . hydrOXYzine (ATARAX/VISTARIL) tablet 25 mg  25 mg Oral TID PRN Clapacs, Madie Reno, MD   25 mg at 05/17/18 2154  . magnesium hydroxide (MILK OF MAGNESIA) suspension 30 mL  30 mL Oral Daily PRN Clapacs, Madie Reno, MD   30 mL at 05/18/18 2219  . metFORMIN (GLUCOPHAGE-XR) 24 hr tablet 1,000 mg  1,000 mg Oral BID WC Clapacs, John T, MD   1,000 mg at 05/19/18 0750  . protriptyline (VIVACTIL) tablet 10 mg  10 mg Oral TID Clapacs, Madie Reno, MD   Stopped at 05/16/18 [phone]  . traZODone (DESYREL) tablet 50 mg  50 mg Oral QHS PRN Clapacs, Madie Reno, MD   50 mg at 05/18/18 2219   PTA Medications: Medications Prior to Admission  Medication Sig Dispense Refill Last Dose  . exenatide  (BYETTA) 5 MCG/0.02ML SOPN injection Inject 0.02 mLs (5 mcg total) into the skin 2 (two) times daily with a meal. For DM 1.2 mL 0   . ferrous sulfate 325 (65 FE) MG tablet Take 1 tablet (325 mg total) by mouth daily with breakfast. For iron deficiency anemia 30 tablet 0   . FLUoxetine (PROZAC) 40 MG capsule Take 1 capsule (40 mg total) by mouth daily. For depression 1 capsule 0   . gabapentin (NEURONTIN) 300 MG capsule Take 1 capsule (300 mg total) by mouth 3 (three) times daily. For pain 90 capsule 0   . hydrOXYzine (ATARAX/VISTARIL) 25 MG tablet Take 1 tablet (25 mg total) by mouth 3 (three) times daily as needed for anxiety. 30 tablet 0   . metformin (FORTAMET) 1000 MG (OSM) 24 hr tablet Take 1 tablet (1,000 mg total) by mouth 2 (two) times daily with a meal. For DM 60 tablet 0   . protriptyline (VIVACTIL) 10 MG tablet Take 1 tablet (10 mg total) by mouth 3 (three) times daily. Narcolepsy 90 tablet 0   . traZODone (DESYREL) 50 MG tablet Take 1 tablet (50 mg total) by mouth at bedtime. Take 2 hours before bedtime: For sleep 1 tablet 0   . traZODone (DESYREL) 50 MG tablet Take 1 tablet (50 mg total) by mouth at bedtime as needed for sleep.  30 tablet 0     Patient Stressors: Health problems Medication change or noncompliance  Patient Strengths: Ability for insight Average or above average intelligence Capable of independent living Communication skills Supportive family/friends  Treatment Modalities: Medication Management, Group therapy, Case management,  1 to 1 session with clinician, Psychoeducation, Recreational therapy.   Physician Treatment Plan for Primary Diagnosis: Severe recurrent major depression without psychotic features ([place]) Long Term Goal(s): Improvement in symptoms so as ready for discharge Improvement in symptoms so as ready for discharge   Short Term Goals: Ability to verbalize feelings will improve Ability to disclose and discuss suicidal ideas Ability to maintain  clinical measurements within normal limits will improve Compliance with prescribed medications will improve  Medication Management: Evaluate patient's response, side effects, and tolerance of medication regimen.  Therapeutic Interventions: 1 to 1 sessions, Unit Group sessions and Medication administration.  Evaluation of Outcomes: Progressing  Physician Treatment Plan for Secondary Diagnosis: Principal Problem:   Severe recurrent major depression without psychotic features ([place]) Active Problems:   Diabetes ([place])   Posttraumatic stress disorder   Diabetic neuropathy associated with type 2 diabetes mellitus ([place])  Long Term Goal(s): Improvement in symptoms so as ready for discharge Improvement in symptoms so as ready for discharge   Short Term Goals: Ability to verbalize feelings will improve Ability to disclose and discuss suicidal ideas Ability to maintain clinical measurements within normal limits will improve Compliance with prescribed medications will improve     Medication Management: Evaluate patient's response, side effects, and tolerance of medication regimen.  Therapeutic Interventions: 1 to 1 sessions, Unit Group sessions and Medication administration.  Evaluation of Outcomes: Progressing   RN Treatment Plan for Primary Diagnosis: Severe recurrent major depression without psychotic features ([place]) Long Term Goal(s): Knowledge of disease and therapeutic regimen to maintain health will improve  Short Term Goals: Ability to verbalize feelings will improve, Ability to identify and develop effective coping behaviors will improve and Compliance with prescribed medications will improve  Medication Management: RN will administer medications as ordered by provider, will assess and evaluate patient's response and provide education to patient for prescribed medication. RN will report any adverse and/or side effects to prescribing provider.  Therapeutic Interventions: 1 on 1  counseling sessions, Psychoeducation, Medication administration, Evaluate responses to treatment, Monitor vital signs and CBGs as ordered, Perform/monitor CIWA, COWS, AIMS and Fall Risk screenings as ordered, Perform wound care treatments as ordered.  Evaluation of Outcomes: Progressing   LCSW Treatment Plan for Primary Diagnosis: Severe recurrent major depression without psychotic features ([place]) Long Term Goal(s): Safe transition to appropriate next level of care at discharge, Engage patient in therapeutic group addressing interpersonal concerns.  Short Term Goals: Engage patient in aftercare planning with referrals and resources, Increase emotional regulation and Increase skills for wellness and recovery  Therapeutic Interventions: Assess for all discharge needs, 1 to 1 time with Social worker, Explore available resources and support systems, Assess for adequacy in community support network, Educate family and significant other(s) on suicide prevention, Complete Psychosocial Assessment, Interpersonal group therapy.  Evaluation of Outcomes: Progressing   Progress in Treatment: Attending groups: Yes. Participating in groups: Yes. Taking medication as prescribed: Yes. Toleration medication: Yes. Family/Significant other contact made: No, will contact:  a support if pt provides consent. Patient understands diagnosis: Yes. Discussing patient identified problems/goals with staff: Yes. Medical problems stabilized or resolved: Yes. Denies suicidal/homicidal ideation: Yes. Issues/concerns per patient self-inventory: No. Other: None at this time.   New problem(s) identified: No, Describe:  none at this time  New Short Term/Long Term Goal(s): Pt will report a decrease in depression by 24 hours prior to discharge.   Patient Goals:  Pt reported her goal for treatment is to, "work on my depression and feel better. I'm here to get ECT."  Discharge Plan or Barriers: Pt will continue tx with her  current tx providers [facility] Outpatient tx and a therapist with [facility].   Reason for Continuation of Hospitalization: Depression Medication stabilization  Estimated Length of Stay: 7 days  Recreational Therapy: Patient Stressors: N/A  Patient Goal: Patient will identify 3 triggers for depression within 5 recreation therapy group sessions  Attendees: Patient: Renee Turner 05/19/2018 9:47 AM  Physician:  05/19/2018 9:47 AM  Nursing:  05/19/2018 9:47 AM  RN Care Manager: 05/19/2018 9:47 AM  Social Worker: Alden Hipp, LCSW 05/19/2018 9:47 AM  Recreational Therapist:  05/19/2018 9:47 AM  Other:  05/19/2018 9:47 AM  Other:  05/19/2018 9:47 AM  Other: 05/19/2018 9:47 AM    Scribe for Treatment Team: Alden Hipp, LCSW 05/19/2018 9:47 AM

## 2018-05-19 NOTE — Plan of Care (Signed)
Working on coping abilities, Voice of no safety concerns  Remained depressed  Problem: Coping: Goal: Coping ability will improve Outcome: Progressing Goal: Will verbalize feelings Outcome: Progressing   Problem: Safety: Goal: Ability to disclose and discuss suicidal ideas will improve Outcome: Progressing Goal: Ability to identify and utilize support systems that promote safety will improve Outcome: Progressing   Problem: Safety: Goal: Ability to identify and utilize support systems that promote safety will improve Outcome: Progressing   Problem: Education: Goal: Knowledge of [place] [facility] Education information/materials will improve Outcome: Progressing Goal: Emotional status will improve Outcome: Progressing Goal: Mental status will improve Outcome: Progressing Goal: Verbalization of understanding the information provided will improve Outcome: Progressing

## 2018-05-20 NOTE — Plan of Care (Addendum)
Patient found in bed awake upon my arrival. Patient is neither visible nor social this evening. Patient remains isolative to bed except to have a snack this evening. Reports worsening depression but unable to state why. Thought blocking and aphasia noted during assessment. Patient unable to express herself adequately. Processing is slow. Progression is incremental. Denies SI/HI/AVH. Reports eating and voiding adequately. Patient has no scheduled HS medications. Given Trazodone for sleep. Compliant with staff direction. Educated regarding NPO after midnight status. Verbalizes understanding. Q 15 minute checks maintained. Will continue to monitor throughout the shift. @0005 , patient reports insomnia and anxiety. Given Vistaril with small amount of water. Reminded patient about NPO status. Verbalized understanding. Will monitor for efficacy. Patient slept 7 hours. Reported as restless. NPO status maintained. CBG 95. Will endorse care to oncoming shift.  Problem: Coping: Goal: Coping ability will improve Outcome: Progressing Goal: Will verbalize feelings Outcome: Progressing   Problem: Safety: Goal: Ability to disclose and discuss suicidal ideas will improve Outcome: Progressing   Problem: Education: Goal: Emotional status will improve Outcome: Progressing Goal: Mental status will improve Outcome: Progressing Goal: Verbalization of understanding the information provided will improve Outcome: Progressing

## 2018-05-20 NOTE — Progress Notes (Signed)
Recreation Therapy Notes  Date: 05/20/2018  Time: 9:30 am   Location: Craft Room   Behavioral response: N/A   Intervention Topic:  Relaxation  Discussion/Intervention: Patient did not attend group.   Clinical Observations/Feedback:  Patient did not attend group.   Tasmine Hipwell LRT/CTRS        Anival Pasha 05/20/2018 10:42 AM

## 2018-05-20 NOTE — Plan of Care (Signed)
Resting and calm in bed, feeling depressed and isolative in her room but claim that her treatment is working well for her. Patient contract for safety of self and others no distress , cooperating with care of ADLs, 15 minute safety rounding is in progress. Problem: Coping: Goal: Coping ability will improve Outcome: Progressing Goal: Will verbalize feelings Outcome: Progressing   Problem: Safety: Goal: Ability to disclose and discuss suicidal ideas will improve Outcome: Progressing Goal: Ability to identify and utilize support systems that promote safety will improve Outcome: Progressing   Problem: Education: Goal: Knowledge of [place] [facility] Education information/materials will improve Outcome: Progressing Goal: Emotional status will improve Outcome: Progressing Goal: Mental status will improve Outcome: Progressing Goal: Verbalization of understanding the information provided will improve Outcome: Progressing

## 2018-05-20 NOTE — [facility] Group Notes (Signed)
CSW Group Therapy Note  05/20/2018  Time:  0900  Type of Therapy and Topic: Group Therapy: Goals Group: SMART Goals    Participation Level:  Did Not Attend    Description of Group:   The purpose of a daily goals group is to assist and guide patients in setting recovery/wellness-related goals. The objective is to set goals as they relate to the crisis in which they were admitted. Patients will be using SMART goal modalities to set measurable goals. Characteristics of realistic goals will be discussed and patients will be assisted in setting and processing how one will reach their goal. Facilitator will also assist patients in applying interventions and coping skills learned in psycho-education groups to the SMART goal and process how one will achieve defined goal.    Therapeutic Goals:  -Patients will develop and document one goal related to or their crisis in which brought them into treatment.  -Patients will be guided by LCSW using SMART goal setting modality in how to set a measurable, attainable, realistic and time sensitive goal.  -Patients will process barriers in reaching goal.  -Patients will process interventions in how to overcome and successful in reaching goal.    Patient's Goal:  Pt was invited to attend group but chose not to attend. CSW will continue to encourage pt to attend group throughout their admission.   Therapeutic Modalities:  Motivational Interviewing  Cognitive Behavioral Therapy  Crisis Intervention Model  SMART goals setting  Heidi DachKelsey Tannor Pyon, MSW, LCSW Clinical Social Worker 05/20/2018 9:26 AM

## 2018-05-20 NOTE — CV Procedure (Signed)
D: Patient stated slept good last night .Stated appetite is good and energy level  Is normal. Stated concentration is good . Stated on Depression scale  3, hopeless 0 and anxiety 0 .( low 0-10 high) Denies suicidal  homicidal ideations  .  No auditory hallucinations  No pain concerns . Appropriate ADL'S. Interacting with peers and staff. Patient  attempting to  work on coping skills  by coming out of room . Limited  participation with other peers . Denies suicidal ideations . Family able to visit for support at night   Emotional and mental  status improving . Instruction given on [facility]  information given for better understanding  A: Encourage patient participation with unit programming . Instruction  Given on  Medication , verbalize understanding. R: Voice no other concerns. Staff continue to monitor

## 2018-05-20 NOTE — [facility] Group Notes (Signed)
05/20/2018 1PM  Type of Therapy/Topic:  Group Therapy:  Feelings about Diagnosis  Participation Level:  None   Description of Group:   This group will allow patients to explore their thoughts and feelings about diagnoses they have received. Patients will be guided to explore their level of understanding and acceptance of these diagnoses. Facilitator will encourage patients to process their thoughts and feelings about the reactions of others to their diagnosis and will guide patients in identifying ways to discuss their diagnosis with significant others in their lives. This group will be process-oriented, with patients participating in exploration of their own experiences, giving and receiving support, and processing challenge from other group members.   Therapeutic Goals: 1. Patient will demonstrate understanding of diagnosis as evidenced by identifying two or more symptoms of the disorder 2. Patient will be able to express two feelings regarding the diagnosis 3. Patient will demonstrate their ability to communicate their needs through discussion and/or role play  Summary of Patient Progress: .Patient practiced active listening when interacting with the facilitator and other group members. She did not speak during the group but was attentive. Patient is still in the process of obtaining treatment goals.        Therapeutic Modalities:   Cognitive Behavioral Therapy Brief Therapy Feelings Identification    [name]  Emanuelle Turner, Alexander MtLCSW 05/20/2018 2:33 PM

## 2018-05-20 NOTE — Progress Notes (Signed)
Patient ID: Renee Turner, female   DOB: 04-Apr-1996, 22 y.o.   MRN: [id number]  CSW met with pt at pt's request. Pt signed consent so CSW could speak with HR at her job. CSW contacted [employer] at [place] at [phone]. CSW spoke with Gordan Payment, the director of HR. Lilia Pro wanted to ensure pt received clothing that she sent to her, and also wanted to request a return to work letter upon pt's discharge. CSW will continue to coordinate with pt's HR director as needed .   Alden Hipp, MSW, LCSW Clinical Social Worker 05/20/2018 2:25 PM

## 2018-05-20 NOTE — Plan of Care (Addendum)
Patient  attempting to  work on coping skills  by coming out of room . Limited  participation with other peers . Denies suicidal ideations . Family able to visit for support at night   Emotional and mental  status improving . Instruction given on [place]  information given for better understanding  Problem: Coping: Goal: Coping ability will improve Outcome: Progressing Goal: Will verbalize feelings Outcome: Progressing   Problem: Safety: Goal: Ability to disclose and discuss suicidal ideas will improve Outcome: Progressing Goal: Ability to identify and utilize support systems that promote safety will improve Outcome: Progressing   Problem: Education: Goal: Knowledge of  General Education information/materials will improve Outcome: Progressing Goal: Emotional status will improve Outcome: Progressing Goal: Mental status will improve Outcome: Progressing Goal: Verbalization of understanding the information provided will improve Outcome: Progressing

## 2018-05-20 NOTE — Progress Notes (Signed)
[facility] MD Progress Note  05/20/2018 6:59 PM Renee Turner  MRN:  [id number] Subjective: Follow-up for 22 year old woman with severe recurrent depression and PTSD.  Patient reports she is still feeling depressed sad and nervous.  Intermittent suicidal thoughts without intent or plan.  Not having any current psychotic thoughts.  No new physical complaints.  She does inquire about her medicines believing that she used to take another antidepressant before coming over here to our hospital. Principal Problem: Severe recurrent major depression without psychotic features [facility]) Diagnosis:   Patient Active Problem List   Diagnosis Date Noted  . Severe recurrent major depression without psychotic features ([place]) [F33.2] 05/14/2018  . MDD (major depressive disorder), recurrent severe, without psychosis ([place]) [F33.2] 05/06/2018  . 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 Posttraumatic stress disorder [F43.10] 11/13/2016  . Severe episode of recurrent major depressive disorder, without psychotic features ([place]) [F33.2] 11/07/2016  . Diabetes ([place]) [E11.9] 11/07/2016  . Diabetic neuropathy associated with type 2 diabetes mellitus ([place]) [E11.40] 07/19/2016  . Obesity (BMI 30.0-34.9) [E66.9] 07/19/2016  . Type 2 diabetes mellitus with hyperglycemia, without long-term current use of insulin ([name]) [E11.65] 07/19/2016   Total Time spent with patient: 30 minutes  Past Psychiatric History: Long-standing depression and PTSD.  History of suicidality.  Now receiving ECT.  Past Medical History:  Past Medical History:  Diagnosis Date  . Anxiety   . Depression   . Diabetes mellitus without complication ([place])    Type II  . Diabetes mellitus, type II ([place])    History reviewed. No pertinent surgical history. Family History: History reviewed. No pertinent family history. Family Psychiatric  History: None Social History:  Social History   Substance and Sexual Activity  Alcohol Use Yes     Social History    Substance and Sexual Activity  Drug Use No    Social History   Socioeconomic History  . Marital status: Single    Spouse name: Not on file  . Number of children: Not on file  . Years of education: Not on file  . Highest education level: Not on file  Occupational History  . Not on file  Social Needs  . Financial resource strain: Not on file  . Food insecurity:    Worry: Not on file    Inability: Not on file  . Transportation needs:    Medical: Not on file    Non-medical: Not on file  Tobacco Use  . Smoking status: Never Smoker  . Smokeless tobacco: Never Used  Substance and Sexual Activity  . Alcohol use: Yes  . Drug use: No  . Sexual activity: Yes    Birth control/protection: None  Lifestyle  . Physical activity:    Days per week: Not on file    Minutes per session: Not on file  . Stress: Not on file  Relationships  . Social connections:    Talks on phone: Not on file    Gets together: Not on file    Attends religious service: Not on file    Active member of club or organization: Not on file    Attends meetings of clubs or organizations: Not on file    Relationship status: Not on file  Other Topics Concern  . Not on file  Social History Narrative  . Not on file   Additional Social History:                         Sleep: Fair  Appetite:  Fair  Current Medications: Current Facility-Administered Medications  Medication Dose Route Frequency Provider Last Rate Last Dose  . acetaminophen (TYLENOL) tablet 650 mg  650 mg Oral Q6H PRN Patty Lopezgarcia, Madie Reno, MD   650 mg at 05/16/18 2115  . alum & mag hydroxide-simeth (MAALOX/MYLANTA) 200-200-20 MG/5ML suspension 30 mL  30 mL Oral Q4H PRN Teal Raben T, MD      . exenatide (BYETTA) immediate release injection  5 mcg Subcutaneous BID WC Cleven Jansma, Madie Reno, MD   Stopped at 05/16/18 [phone]  . ferrous sulfate tablet 325 mg  325 mg Oral Q breakfast Janyla Biscoe, Madie Reno, MD   325 mg at 05/20/18 0814  . FLUoxetine (PROZAC)  capsule 40 mg  40 mg Oral Daily Laquentin Loudermilk, Madie Reno, MD   40 mg at 05/20/18 0811  . gabapentin (NEURONTIN) capsule 300 mg  300 mg Oral TID Silver Achey T, MD   300 mg at 05/20/18 1635  . hydrOXYzine (ATARAX/VISTARIL) tablet 25 mg  25 mg Oral TID PRN Moxie Kalil, Madie Reno, MD   25 mg at 05/17/18 2154  . magnesium hydroxide (MILK OF MAGNESIA) suspension 30 mL  30 mL Oral Daily PRN Sierah Lacewell, Madie Reno, MD   30 mL at 05/18/18 2219  . metFORMIN (GLUCOPHAGE-XR) 24 hr tablet 1,000 mg  1,000 mg Oral BID WC Elan Mcelvain T, MD   1,000 mg at 05/20/18 1635  . protriptyline (VIVACTIL) tablet 10 mg  10 mg Oral TID Alejo Beamer, Madie Reno, MD   Stopped at 05/16/18 [phone]  . traZODone (DESYREL) tablet 50 mg  50 mg Oral QHS PRN Ziere Docken, Madie Reno, MD   50 mg at 05/19/18 2128    Lab Results: No results found for this or any previous visit (from the past 48 hour(s)).  Blood Alcohol level:  Lab Results  Component Value Date   ETH <5 62/37/6283    Metabolic Disorder Labs: Lab Results  Component Value Date   HGBA1C 6.5 (H) 05/07/2018   MPG 139.85 05/07/2018   No results found for: PROLACTIN Lab Results  Component Value Date   CHOL 181 05/07/2018   TRIG 92 05/07/2018   HDL 46 05/07/2018   CHOLHDL 3.9 05/07/2018   VLDL 18 05/07/2018   LDLCALC 117 (H) 05/07/2018    Physical Findings: AIMS: Facial and Oral Movements Muscles of Facial Expression: None, normal Lips and Perioral Area: None, normal Jaw: None, normal Tongue: None, normal,Extremity Movements Upper (arms, wrists, hands, fingers): None, normal Lower (legs, knees, ankles, toes): None, normal, Trunk Movements Neck, shoulders, hips: None, normal, Overall Severity Severity of abnormal movements (highest score from questions above): None, normal Incapacitation due to abnormal movements: None, normal Patient's awareness of abnormal movements (rate only patient's report): No Awareness, Dental Status Current problems with teeth and/or dentures?: No Does patient usually  wear dentures?: No  CIWA:    COWS:     Musculoskeletal: Strength & Muscle Tone: decreased Gait & Station: normal Patient leans: N/A  Psychiatric Specialty Exam: Physical Exam  Nursing note and vitals reviewed. Constitutional: She appears well-developed and well-nourished.  HENT:  Head: Normocephalic and atraumatic.  Eyes: Pupils are equal, round, and reactive to light. Conjunctivae are normal.  Neck: Normal range of motion.  Cardiovascular: Regular rhythm and normal heart sounds.  Respiratory: Effort normal. No respiratory distress.  GI: Soft.  Musculoskeletal: Normal range of motion.  Neurological: She is alert.  Skin: Skin is warm and dry.  Psychiatric: Judgment normal. Her affect is blunt. Her speech is delayed.  She is slowed. Cognition and memory are normal. She expresses suicidal ideation. She expresses no suicidal plans.    Review of Systems  Constitutional: Negative.   HENT: Negative.   Eyes: Negative.   Respiratory: Negative.   Cardiovascular: Negative.   Gastrointestinal: Negative.   Musculoskeletal: Negative.   Skin: Negative.   Neurological: Negative.   Psychiatric/Behavioral: Positive for depression and suicidal ideas. Negative for hallucinations, memory loss and substance abuse. The patient is nervous/anxious and has insomnia.     Blood pressure (!) 97/57, pulse (!) 138, temperature 98.7 F (37.1 C), temperature source Oral, resp. rate 18, height '5\' 6"'  (1.676 m), weight 92.5 kg (204 lb), SpO2 100 %.Body mass index is 32.93 kg/m.  General Appearance: Fairly Groomed  Eye Contact:  Minimal  Speech:  Slow  Volume:  Decreased  Mood:  Depressed  Affect:  Flat  Thought Process:  Goal Directed  Orientation:  Full (Time, Place, and Person)  Thought Content:  Logical  Suicidal Thoughts:  Yes.  without intent/plan  Homicidal Thoughts:  No  Memory:  Immediate;   Fair Recent;   Fair Remote;   Fair  Judgement:  Fair  Insight:  Fair  Psychomotor Activity:   Decreased  Concentration:  Concentration: Fair  Recall:  [employer] of Knowledge:  Fair  Language:  Fair  Akathisia:  No  Handed:  Right  AIMS (if indicated):     Assets:  Desire for Improvement Housing  ADL's:  Intact  Cognition:  WNL  Sleep:  Number of Hours: 8.3     Treatment Plan Summary: Daily contact with patient to assess and evaluate symptoms and progress in treatment, Medication management and Plan Continue current medicines.  Review psychiatric medicine from previous hospitalization and restart anything that she thinks was previously helpful.  Next ECT treatment scheduled for tomorrow.  Blood sugars are stable no need for change in any medicine to that.  Alethia Berthold, MD 05/20/2018, 6:59 PM

## 2018-05-21 ENCOUNTER — Other Ambulatory Visit: Payer: Self-pay | Admitting: Psychiatry

## 2018-05-21 ENCOUNTER — Inpatient Hospital Stay: Payer: BLUE CROSS/BLUE SHIELD | Admitting: Anesthesiology

## 2018-05-21 LAB — GLUCOSE, CAPILLARY: Glucose-Capillary: 95 mg/dL (ref 70–99)

## 2018-05-21 MED ORDER — LABETALOL HCL 5 MG/ML IV SOLN
INTRAVENOUS | Status: DC | PRN
  Administered 2018-05-21: 10 mg via INTRAVENOUS

## 2018-05-21 MED ORDER — METHOHEXITAL SODIUM 100 MG/10ML IV SOSY
PREFILLED_SYRINGE | INTRAVENOUS | Status: DC | PRN
  Administered 2018-05-21: 90 mg via INTRAVENOUS

## 2018-05-21 MED ORDER — ESMOLOL HCL 100 MG/10ML IV SOLN
INTRAVENOUS | Status: AC
  Filled 2018-05-21: qty 10

## 2018-05-21 MED ORDER — ESMOLOL HCL 100 MG/10ML IV SOLN
INTRAVENOUS | Status: DC | PRN
  Administered 2018-05-21: 30 mg via INTRAVENOUS

## 2018-05-21 MED ORDER — SUCCINYLCHOLINE CHLORIDE 20 MG/ML IJ SOLN
INTRAMUSCULAR | Status: AC
  Filled 2018-05-21: qty 1

## 2018-05-21 MED ORDER — SODIUM CHLORIDE 0.9 % IV SOLN
500.0000 mL | Freq: Once | INTRAVENOUS | Status: AC
  Administered 2018-05-21: 500 mL via INTRAVENOUS

## 2018-05-21 MED ORDER — SUCCINYLCHOLINE CHLORIDE 200 MG/10ML IV SOSY
PREFILLED_SYRINGE | INTRAVENOUS | Status: DC | PRN
  Administered 2018-05-21: 100 mg via INTRAVENOUS

## 2018-05-21 MED ORDER — LABETALOL HCL 5 MG/ML IV SOLN
INTRAVENOUS | Status: AC
  Filled 2018-05-21: qty 4

## 2018-05-21 MED ORDER — SODIUM CHLORIDE 0.9 % IV SOLN
INTRAVENOUS | Status: DC | PRN
  Administered 2018-05-21: 10:00:00 via INTRAVENOUS

## 2018-05-21 MED ORDER — KETOROLAC TROMETHAMINE 30 MG/ML IJ SOLN
INTRAMUSCULAR | Status: AC
  Administered 2018-05-21: 30 mg via INTRAVENOUS
  Filled 2018-05-21: qty 1

## 2018-05-21 MED ORDER — KETOROLAC TROMETHAMINE 30 MG/ML IJ SOLN
30.0000 mg | Freq: Once | INTRAMUSCULAR | Status: AC
  Administered 2018-05-21: 30 mg via INTRAVENOUS

## 2018-05-21 NOTE — H&P (Signed)
Renee Turner is an 22 y.o. female.   Chief Complaint: Depressed HPI: Severe recurrent depression and CT  Past Medical History:  Diagnosis Date  . Anxiety   . Depression   . Diabetes mellitus without complication ([place])    Type II  . Diabetes mellitus, type II ([place])     History reviewed. No pertinent surgical history.  History reviewed. No pertinent family history. Social History:  reports that she has never smoked. She has never used smokeless tobacco. She reports that she drinks alcohol. She reports that she does not use drugs.  Allergies: No Known Allergies  Medications Prior to Admission  Medication Sig Dispense Refill  . exenatide (BYETTA) 5 MCG/0.02ML SOPN injection Inject 0.02 mLs (5 mcg total) into the skin 2 (two) times daily with a meal. For DM 1.2 mL 0  . ferrous sulfate 325 (65 FE) MG tablet Take 1 tablet (325 mg total) by mouth daily with breakfast. For iron deficiency anemia 30 tablet 0  . FLUoxetine (PROZAC) 40 MG capsule Take 1 capsule (40 mg total) by mouth daily. For depression 1 capsule 0  . gabapentin (NEURONTIN) 300 MG capsule Take 1 capsule (300 mg total) by mouth 3 (three) times daily. For pain 90 capsule 0  . hydrOXYzine (ATARAX/VISTARIL) 25 MG tablet Take 1 tablet (25 mg total) by mouth 3 (three) times daily as needed for anxiety. 30 tablet 0  . metformin (FORTAMET) 1000 MG (OSM) 24 hr tablet Take 1 tablet (1,000 mg total) by mouth 2 (two) times daily with a meal. For DM 60 tablet 0  . protriptyline (VIVACTIL) 10 MG tablet Take 1 tablet (10 mg total) by mouth 3 (three) times daily. Narcolepsy 90 tablet 0  . traZODone (DESYREL) 50 MG tablet Take 1 tablet (50 mg total) by mouth at bedtime. Take 2 hours before bedtime: For sleep 1 tablet 0  . traZODone (DESYREL) 50 MG tablet Take 1 tablet (50 mg total) by mouth at bedtime as needed for sleep. 30 tablet 0    Results for orders placed or performed during the hospital encounter of 05/14/18 (from the past 48 hour(s))   Glucose, capillary     Status: None   Collection Time: 05/21/18  6:22 AM  Result Value Ref Range   Glucose-Capillary 95 70 - 99 mg/dL   No results found.  Review of Systems  Constitutional: Negative.   HENT: Negative.   Eyes: Negative.   Respiratory: Negative.   Cardiovascular: Negative.   Gastrointestinal: Negative.   Musculoskeletal: Negative.   Skin: Negative.   Neurological: Negative.   Psychiatric/Behavioral: Positive for depression. Negative for hallucinations, memory loss, substance abuse and suicidal ideas. The patient is nervous/anxious. The patient does not have insomnia.     Blood pressure 108/71, pulse 91, temperature 98.3 F (36.8 C), temperature source Oral, resp. rate 16, height '5\' 6"'  (1.676 m), weight 91.6 kg (202 lb), SpO2 100 %. Physical Exam  Nursing note and vitals reviewed. Constitutional: She appears well-developed and well-nourished.  HENT:  Head: Normocephalic and atraumatic.  Eyes: Pupils are equal, round, and reactive to light. Conjunctivae are normal.  Neck: Normal range of motion.  Cardiovascular: Regular rhythm and normal heart sounds.  Respiratory: Effort normal. No respiratory distress.  GI: Soft.  Musculoskeletal: Normal range of motion.  Neurological: She is alert.  Skin: Skin is warm and dry.  Psychiatric: Judgment normal. Her affect is blunt. Her speech is delayed. She is slowed. Cognition and memory are normal. She expresses no homicidal and no  suicidal ideation.     Assessment/Plan [facility] inpatient 3xweek  Alethia Berthold, MD 05/21/2018, 9:56 AM

## 2018-05-21 NOTE — Anesthesia Postprocedure Evaluation (Signed)
Anesthesia Post Note  Patient: Renee Turner  Procedure(s) Performed: ECT TX  Patient location during evaluation: PACU Anesthesia Type: General Level of consciousness: awake and alert Pain management: pain level controlled Vital Signs Assessment: post-procedure vital signs reviewed and stable Respiratory status: spontaneous breathing, nonlabored ventilation and respiratory function stable Cardiovascular status: blood pressure returned to baseline and stable Postop Assessment: no apparent nausea or vomiting Anesthetic complications: no     Last Vitals:  Vitals:   05/21/18 1227 05/21/18 1235  BP: 108/74   Pulse: 97 88  Resp: 18 (!) 21  Temp:  (!) 36.1 C  SpO2: 100% 100%    Last Pain:  Vitals:   05/21/18 1207  TempSrc:   PainSc: 0-No pain                 Durenda Hurt

## 2018-05-21 NOTE — Procedures (Signed)
ECT SERVICES Physician's Interval Evaluation & Treatment Note  Patient Identification: Renee Turner MRN:  [id number] Date of Evaluation:  05/21/2018 TX #: 2  MADRS:   MMSE:   P.E. Findings:  No change to physical exam  Psychiatric Interval Note:  Remains down depressed and withdrawn  Subjective:  Patient is a 22 y.o. female seen for evaluation for Electroconvulsive Therapy. Depression and anxiety  Treatment Summary:   '[x]'   Right Unilateral             '[]'  Bilateral   % Energy : 0.3 ms 25%   Impedance: 1560 ohms  Seizure Energy Index: 15,076 V squared  Postictal Suppression Index: Less than 10%  Seizure Concordance Index: 97%  Medications  Pre Shock: Robinul 0.1 mg Toradol 30 mg esmolol 30 mg labetalol 10 mg Brevital 90 mg succinylcholine 100 mg  Post Shock:    Seizure Duration: 26 seconds EMG 67 seconds EEG   Comments: Follow-up Friday  Lungs:  '[x]'   Clear to auscultation               '[]'  Other:   Heart:    '[x]'   Regular rhythm             '[]'  irregular rhythm    '[x]'   Previous H&P reviewed, patient examined and there are NO CHANGES                 '[]'   Previous H&P reviewed, patient examined and there are changes noted.   Alethia Berthold, MD 7/17/201911:38 AM

## 2018-05-21 NOTE — Transfer of Care (Signed)
Immediate Anesthesia Transfer of Care Note  Patient: Renee Turner  Procedure(s) Performed: ECT TX  Patient Location: PACU  Anesthesia Type:General  Level of Consciousness: sedated  Airway & Oxygen Therapy: Patient Spontanous Breathing and Patient connected to face mask oxygen  Post-op Assessment: Report given to RN and Post -op Vital signs reviewed and stable  Post vital signs: Reviewed and stable  Last Vitals:  Vitals Value Taken Time  BP 113/83 05/21/2018 11:56 AM  Temp 36.6 C 05/21/2018 11:56 AM  Pulse 96 05/21/2018 11:58 AM  Resp 16 05/21/2018 11:58 AM  SpO2 100 % 05/21/2018 11:58 AM  Vitals shown include unvalidated device data.  Last Pain:  Vitals:   05/21/18 1156  TempSrc:   PainSc: 0-No pain         Complications: No apparent anesthesia complications

## 2018-05-21 NOTE — Progress Notes (Signed)
D: Pt denies SI/HI/AVH. Pt is pleasant and cooperative, but continues to report depression. Pt. has no Complaints this shift. Pt. Completed ECT today and was then transported back to unit. Pt. Is frequently isolative and withdrawn most of the shift, but was observed much more active in the milieu later in the shift smiling and socializing with peers after ect. Pt. Utilizes thought blocking behaviors at times. Pt. Monitored per post ECT MD precautions. Pt. After ECT orientation alert and oriented x 4 eating and drinking appropriately. Pt. Monitored for falls and aspirations. Pt. Walking steady on her feet post ECT.      A: Q x 15 minute observation checks were completed for safety. Patient was provided with education.  Patient was given scheduled medications. Patient  was encourage to attend groups, participate in unit activities and continue with plan of care. Pt. Chart and plans of care reviewed. Pt. Given support and encouragement.   R: Patient is complaint with medication and unit procedures.             Precautionary checks every 15 minutes for safety maintained, room free of safety hazards, patient sustains no injury or falls during this shift.

## 2018-05-21 NOTE — Progress Notes (Signed)
Recreation Therapy Notes  Date: 05/21/2018  Time: 9:30 am   Location: Craft Room   Behavioral response: N/A   Intervention Topic:  Goals  Discussion/Intervention: Patient did not attend group.   Clinical Observations/Feedback:  Patient did not attend group.   Indy Kuck LRT/CTRS        Chauntel Windsor 05/21/2018 10:26 AM 

## 2018-05-21 NOTE — Anesthesia Procedure Notes (Signed)
Date/Time: 05/21/2018 11:46 AM Performed by: Lily KocherPeralta, Jany Buckwalter, CRNA Pre-anesthesia Checklist: Patient identified, Emergency Drugs available, Suction available and Patient being monitored Patient Re-evaluated:Patient Re-evaluated prior to induction Oxygen Delivery Method: Circle system utilized Preoxygenation: Pre-oxygenation with 100% oxygen Induction Type: IV induction Ventilation: Mask ventilation without difficulty and Mask ventilation throughout procedure Airway Equipment and Method: Bite block Placement Confirmation: positive ETCO2 Dental Injury: Teeth and Oropharynx as per pre-operative assessment

## 2018-05-21 NOTE — Anesthesia Post-op Follow-up Note (Signed)
Anesthesia QCDR form completed.        

## 2018-05-21 NOTE — Progress Notes (Signed)
Recreation Therapy Notes  Date: 05/21/2018  Time: 3:00pm  Location: Craft room  Behavioral response: N/A  Group Type: Game  Participation level: N/A  Communication: Patient did not attend group.  Comments: N/A  Zigmond Trela LRT/CTRS        Cordaryl Decelles 05/21/2018 3:56 PM

## 2018-05-21 NOTE — Plan of Care (Signed)
Pt. Denies SI/HI. Pt. Verbally is able to contract for safety. Pt. Verbalizes understanding of provided education.    Problem: Safety: Goal: Ability to disclose and discuss suicidal ideas will improve Outcome: Progressing   Problem: Education: Goal: Knowledge of [place] [facility] Education information/materials will improve Outcome: Progressing

## 2018-05-21 NOTE — Anesthesia Preprocedure Evaluation (Signed)
Anesthesia Evaluation  Patient identified by MRN, date of birth, ID band Patient awake    Reviewed: Allergy & Precautions, NPO status , Patient's Chart, lab work & pertinent test results  Airway Mallampati: II  TM Distance: >3 FB     Dental no notable dental hx. (+) Teeth Intact   Pulmonary neg pulmonary ROS,    Pulmonary exam normal breath sounds clear to auscultation       Cardiovascular negative cardio ROS Normal cardiovascular exam Rhythm:Regular Rate:Normal     Neuro/Psych PSYCHIATRIC DISORDERS Anxiety Depression negative neurological ROS  negative psych ROS   GI/Hepatic negative GI ROS, Neg liver ROS,   Endo/Other  negative endocrine ROSdiabetes, Well Controlled, Type 2  Renal/GU negative Renal ROS  negative genitourinary   Musculoskeletal negative musculoskeletal ROS (+)   Abdominal Normal abdominal exam  (+)   Peds negative pediatric ROS (+)  Hematology negative hematology ROS (+)   Anesthesia Other Findings   Reproductive/Obstetrics negative OB ROS                             Anesthesia Physical  Anesthesia Plan  ASA: II  Anesthesia Plan: General   Post-op Pain Management:    Induction: Intravenous  PONV Risk Score and Plan: TIVA  Airway Management Planned: Mask  Additional Equipment:   Intra-op Plan:   Post-operative Plan:   Informed Consent: I have reviewed the patients History and Physical, chart, labs and discussed the procedure including the risks, benefits and alternatives for the proposed anesthesia with the patient or authorized representative who has indicated his/her understanding and acceptance.   Dental advisory given  Plan Discussed with: CRNA and Surgeon  Anesthesia Plan Comments:         Anesthesia Quick Evaluation

## 2018-05-21 NOTE — Progress Notes (Signed)
[facility] MD Progress Note  05/21/2018 11:33 PM Renee Turner  MRN:  [id number] Subjective: Follow-up 22 year old woman with severe recurrent depression and receiving ECT.  Patient had ECT this morning which was tolerated without difficulty.  Patient continues to be withdrawn and blunted.  Passive suicidal thoughts without intent or plan.  Has some interaction with other peers but mostly staying isolated.  Physically no difficulty blood sugars are stable. Principal Problem: Severe recurrent major depression without psychotic features ([place]) Diagnosis:   Patient Active Problem List   Diagnosis Date Noted  . Severe recurrent major depression without psychotic features ([place]) [F33.2] 05/14/2018  . MDD (major depressive disorder), recurrent severe, without psychosis ([place]) [F33.2] 05/06/2018  . 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 Posttraumatic stress disorder [F43.10] 11/13/2016  . Severe episode of recurrent major depressive disorder, without psychotic features ([place]) [F33.2] 11/07/2016  . Diabetes ([place]) [E11.9] 11/07/2016  . Diabetic neuropathy associated with type 2 diabetes mellitus ([place]) [E11.40] 07/19/2016  . Obesity (BMI 30.0-34.9) [E66.9] 07/19/2016  . Type 2 diabetes mellitus with hyperglycemia, without long-term current use of insulin ([name]) [E11.65] 07/19/2016   Total Time spent with patient: 30 minutes  Past Psychiatric History: Who past history of depression and PTSD with several previous hospitalizations and concern about suicidal ideation  Past Medical History:  Past Medical History:  Diagnosis Date  . Anxiety   . Depression   . Diabetes mellitus without complication ([place])    Type II  . Diabetes mellitus, type II ([place])    History reviewed. No pertinent surgical history. Family History: History reviewed. No pertinent family history. Family Psychiatric  History: See previous note Social History:  Social History   Substance and Sexual Activity  Alcohol Use Yes      Social History   Substance and Sexual Activity  Drug Use No    Social History   Socioeconomic History  . Marital status: Single    Spouse name: Not on file  . Number of children: Not on file  . Years of education: Not on file  . Highest education level: Not on file  Occupational History  . Not on file  Social Needs  . Financial resource strain: Not on file  . Food insecurity:    Worry: Not on file    Inability: Not on file  . Transportation needs:    Medical: Not on file    Non-medical: Not on file  Tobacco Use  . Smoking status: Never Smoker  . Smokeless tobacco: Never Used  Substance and Sexual Activity  . Alcohol use: Yes  . Drug use: No  . Sexual activity: Yes    Birth control/protection: None  Lifestyle  . Physical activity:    Days per week: Not on file    Minutes per session: Not on file  . Stress: Not on file  Relationships  . Social connections:    Talks on phone: Not on file    Gets together: Not on file    Attends religious service: Not on file    Active member of club or organization: Not on file    Attends meetings of clubs or organizations: Not on file    Relationship status: Not on file  Other Topics Concern  . Not on file  Social History Narrative  . Not on file   Additional Social History:                         Sleep: Fair  Appetite:  Fair  Current Medications: Current Facility-Administered Medications  Medication Dose Route Frequency Provider Last Rate Last Dose  . acetaminophen (TYLENOL) tablet 650 mg  650 mg Oral Q6H PRN Tradarius Reinwald, Madie Reno, MD   650 mg at 05/16/18 2115  . alum & mag hydroxide-simeth (MAALOX/MYLANTA) 200-200-20 MG/5ML suspension 30 mL  30 mL Oral Q4H PRN Penny Frisbie T, MD      . exenatide (BYETTA) immediate release injection  5 mcg Subcutaneous BID WC Elanore Talcott, Madie Reno, MD   Stopped at 05/16/18 [phone]  . ferrous sulfate tablet 325 mg  325 mg Oral Q breakfast Damonica Chopra, Madie Reno, MD   325 mg at 05/21/18 1536  .  FLUoxetine (PROZAC) capsule 40 mg  40 mg Oral Daily Navaya Wiatrek, Madie Reno, MD   40 mg at 05/21/18 1536  . gabapentin (NEURONTIN) capsule 300 mg  300 mg Oral TID Medard Decuir, Madie Reno, MD   300 mg at 05/21/18 1808  . hydrOXYzine (ATARAX/VISTARIL) tablet 25 mg  25 mg Oral TID PRN Caitlynn Ju, Madie Reno, MD   25 mg at 05/21/18 0019  . magnesium hydroxide (MILK OF MAGNESIA) suspension 30 mL  30 mL Oral Daily PRN Markale Birdsell, Madie Reno, MD   30 mL at 05/18/18 2219  . metFORMIN (GLUCOPHAGE-XR) 24 hr tablet 1,000 mg  1,000 mg Oral BID WC Ashlee Player T, MD   1,000 mg at 05/21/18 1705  . protriptyline (VIVACTIL) tablet 10 mg  10 mg Oral TID British Moyd, Madie Reno, MD   Stopped at 05/16/18 [phone]  . traZODone (DESYREL) tablet 50 mg  50 mg Oral QHS PRN Zarianna Dicarlo, Madie Reno, MD   50 mg at 05/21/18 2114    Lab Results:  Results for orders placed or performed during the hospital encounter of 05/14/18 (from the past 48 hour(s))  Glucose, capillary     Status: None   Collection Time: 05/21/18  6:22 AM  Result Value Ref Range   Glucose-Capillary 95 70 - 99 mg/dL    Blood Alcohol level:  Lab Results  Component Value Date   ETH <5 89/38/1017    Metabolic Disorder Labs: Lab Results  Component Value Date   HGBA1C 6.5 (H) 05/07/2018   MPG 139.85 05/07/2018   No results found for: PROLACTIN Lab Results  Component Value Date   CHOL 181 05/07/2018   TRIG 92 05/07/2018   HDL 46 05/07/2018   CHOLHDL 3.9 05/07/2018   VLDL 18 05/07/2018   LDLCALC 117 (H) 05/07/2018    Physical Findings: AIMS: Facial and Oral Movements Muscles of Facial Expression: None, normal Lips and Perioral Area: None, normal Jaw: None, normal Tongue: None, normal,Extremity Movements Upper (arms, wrists, hands, fingers): None, normal Lower (legs, knees, ankles, toes): None, normal, Trunk Movements Neck, shoulders, hips: None, normal, Overall Severity Severity of abnormal movements (highest score from questions above): None, normal Incapacitation due to abnormal  movements: None, normal Patient's awareness of abnormal movements (rate only patient's report): No Awareness, Dental Status Current problems with teeth and/or dentures?: No Does patient usually wear dentures?: No  CIWA:    COWS:     Musculoskeletal: Strength & Muscle Tone: within normal limits Gait & Station: normal Patient leans: N/A  Psychiatric Specialty Exam: Physical Exam  Nursing note and vitals reviewed. Constitutional: She appears well-developed and well-nourished.  HENT:  Head: Normocephalic and atraumatic.  Eyes: Pupils are equal, round, and reactive to light. Conjunctivae are normal.  Neck: Normal range of motion.  Cardiovascular: Regular rhythm and normal heart sounds.  Respiratory: Effort normal.  No respiratory distress.  GI: Soft.  Musculoskeletal: Normal range of motion.  Neurological: She is alert.  Skin: Skin is warm and dry.  Psychiatric: Thought content normal. Her affect is blunt. She exhibits a depressed mood.    Review of Systems  Constitutional: Negative.   HENT: Negative.   Eyes: Negative.   Respiratory: Negative.   Cardiovascular: Negative.   Gastrointestinal: Negative.   Musculoskeletal: Negative.   Skin: Negative.   Neurological: Negative.   Psychiatric/Behavioral: Positive for depression and suicidal ideas.    Blood pressure 98/60, pulse 98, temperature 98.8 F (37.1 C), temperature source Oral, resp. rate 17, height _0  (1.676 m), weight 91.6 kg (202 lb), SpO2 100 %.Body mass index is 32.6 kg/m.  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  Encourage patient to be active and involved on the unit.  Next treatment scheduled for Friday.  Patient continues to have passive suicidal ideation and be very withdrawn making discharge planning not possible yet.  Alethia Berthold, MD 05/21/2018, 11:33 PM

## 2018-05-22 LAB — POCT PREGNANCY, URINE: PREG TEST UR: NEGATIVE

## 2018-05-22 NOTE — B[facility] Group Notes (Signed)
LCSW Group Therapy Note 05/22/2018 9:00 AM  Type of Therapy and Topic:  Group Therapy:  Setting Goals  Participation Level:  Did Not Attend  Description of Group: In this process group, patients discussed using strengths to work toward goals and address challenges.  Patients identified two positive things about themselves and one goal they were working on.  Patients were given the opportunity to share openly and support each other's plan for self-empowerment.  The group discussed the value of gratitude and were encouraged to have a daily reflection of positive characteristics or circumstances.  Patients were encouraged to identify a plan to utilize their strengths to work on current challenges and goals.  Therapeutic Goals 1. Patient will verbalize personal strengths/positive qualities and relate how these can assist with achieving desired personal goals 2. Patients will verbalize affirmation of peers plans for personal change and goal setting 3. Patients will explore the value of gratitude and positive focus as related to successful achievement of goals 4. Patients will verbalize a plan for regular reinforcement of personal positive qualities and circumstances.  Summary of Patient Progress:   Renee AlmasKarial was invited to today's group, but chose not to attend.    Therapeutic Modalities Cognitive Behavioral Therapy Motivational Interviewing    Renee Turner, [place] 05/22/2018 11:13 AM

## 2018-05-22 NOTE — Plan of Care (Addendum)
Patient found in common area upon my arrival. Patient is visible and social this evening. Patient is out of room for entirety of evening. Patient mood and affect are brighter. Remains minimally verbal with some thought-blocking evident but is able to be part of the group with which she is sitting. Eye contact is improved. Denies all complaints including SI/HI/AVH. Denies pain. Reports eating and voiding adequately. Given trazodone for sleep with positive results. Patient has no scheduled HS medications. Attended group and compliant with staff direction and unit routine. Q 15 minutes checks maintained. Will continue to monitor throughout the shift. Patient slept 5.5 hours. No apparent distress. Reports sleeping well. Pulse initially elevated. WNL upon recheck. BP running low but patient is asymptomatic. Educated patient regarding how to rise slowly to avoid falls. Will endorse care to oncoming shift.  Problem: Coping: Goal: Coping ability will improve Outcome: Progressing Goal: Will verbalize feelings Outcome: Progressing   Problem: Education: Goal: Knowledge of [facility] Education information/materials will improve Outcome: Progressing Goal: Emotional status will improve Outcome: Progressing Goal: Mental status will improve Outcome: Progressing

## 2018-05-22 NOTE — Plan of Care (Signed)
Patient rated her depression today 7/10.Patient states that her depression was better few days ago.Patient could not verbalize any triggering reason for her depression now.Patient attended groups partially.Denies SI,HI and AVH.Appetite and energy level good.Compliant with medications.Support and encouragement given.

## 2018-05-22 NOTE — Progress Notes (Signed)
[facility] MD Progress Note  05/22/2018 7:32 PM Renee Turner  MRN:  [id number] Subjective: Patient continues to feel depressed.  Even worse today.  Feels down and nervous.  Passive suicidal ideation. Principal Problem: Severe recurrent major depression without psychotic features ([place]) Diagnosis:   Patient Active Problem List   Diagnosis Date Noted  . Severe recurrent major depression without psychotic features ([place]) [F33.2] 05/14/2018  . MDD (major depressive disorder), recurrent severe, without psychosis ([place]) [F33.2] 05/06/2018  . 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 Posttraumatic stress disorder [F43.10] 11/13/2016  . Severe episode of recurrent major depressive disorder, without psychotic features ([place]) [F33.2] 11/07/2016  . Diabetes ([place]) [E11.9] 11/07/2016  . Diabetic neuropathy associated with type 2 diabetes mellitus ([place]) [E11.40] 07/19/2016  . Obesity (BMI 30.0-34.9) [E66.9] 07/19/2016  . Type 2 diabetes mellitus with hyperglycemia, without long-term current use of insulin ([name]) [E11.65] 07/19/2016   Total Time spent with patient: 30 minutes  Past Psychiatric History: Long history of PTSD and depression with previous hospitalizations  Past Medical History:  Past Medical History:  Diagnosis Date  . Anxiety   . Depression   . Diabetes mellitus without complication ([place])    Type II  . Diabetes mellitus, type II ([place])    History reviewed. No pertinent surgical history. Family History: History reviewed. No pertinent family history. Family Psychiatric  History: The previous note Social History:  Social History   Substance and Sexual Activity  Alcohol Use Yes     Social History   Substance and Sexual Activity  Drug Use No    Social History   Socioeconomic History  . Marital status: Single    Spouse name: Not on file  . Number of children: Not on file  . Years of education: Not on file  . Highest education level: Not on file  Occupational History  . Not  on file  Social Needs  . Financial resource strain: Not on file  . Food insecurity:    Worry: Not on file    Inability: Not on file  . Transportation needs:    Medical: Not on file    Non-medical: Not on file  Tobacco Use  . Smoking status: Never Smoker  . Smokeless tobacco: Never Used  Substance and Sexual Activity  . Alcohol use: Yes  . Drug use: No  . Sexual activity: Yes    Birth control/protection: None  Lifestyle  . Physical activity:    Days per week: Not on file    Minutes per session: Not on file  . Stress: Not on file  Relationships  . Social connections:    Talks on phone: Not on file    Gets together: Not on file    Attends religious service: Not on file    Active member of club or organization: Not on file    Attends meetings of clubs or organizations: Not on file    Relationship status: Not on file  Other Topics Concern  . Not on file  Social History Narrative  . Not on file   Additional Social History:                         Sleep: Fair  Appetite:  Fair  Current Medications: Current Facility-Administered Medications  Medication Dose Route Frequency Provider Last Rate Last Dose  . acetaminophen (TYLENOL) tablet 650 mg  650 mg Oral Q6H PRN , Madie Reno, MD   650 mg at 05/16/18 2115  . alum &  mag hydroxide-simeth (MAALOX/MYLANTA) 200-200-20 MG/5ML suspension 30 mL  30 mL Oral Q4H PRN ,  T, MD      . exenatide (BYETTA) immediate release injection  5 mcg Subcutaneous BID WC , Madie Reno, MD   Stopped at 05/16/18 [phone]  . ferrous sulfate tablet 325 mg  325 mg Oral Q breakfast , Madie Reno, MD   325 mg at 05/22/18 0831  . FLUoxetine (PROZAC) capsule 40 mg  40 mg Oral Daily , Madie Reno, MD   40 mg at 05/22/18 0831  . gabapentin (NEURONTIN) capsule 300 mg  300 mg Oral TID ,  T, MD   300 mg at 05/22/18 1710  . hydrOXYzine (ATARAX/VISTARIL) tablet 25 mg  25 mg Oral TID PRN , Madie Reno, MD   25 mg at 05/21/18 0019   . magnesium hydroxide (MILK OF MAGNESIA) suspension 30 mL  30 mL Oral Daily PRN , Madie Reno, MD   30 mL at 05/18/18 2219  . metFORMIN (GLUCOPHAGE-XR) 24 hr tablet 1,000 mg  1,000 mg Oral BID WC ,  T, MD   1,000 mg at 05/22/18 1710  . protriptyline (VIVACTIL) tablet 10 mg  10 mg Oral TID , Madie Reno, MD   Stopped at 05/16/18 [phone]  . traZODone (DESYREL) tablet 50 mg  50 mg Oral QHS PRN , Madie Reno, MD   50 mg at 05/21/18 2114    Lab Results:  Results for orders placed or performed during the hospital encounter of 05/14/18 (from the past 48 hour(s))  Glucose, capillary     Status: None   Collection Time: 05/21/18  6:22 AM  Result Value Ref Range   Glucose-Capillary 95 70 - 99 mg/dL  Pregnancy, urine POC     Status: None   Collection Time: 05/21/18  9:17 AM  Result Value Ref Range   Preg Test, Ur NEGATIVE NEGATIVE    Comment:        THE SENSITIVITY OF THIS METHODOLOGY IS >24 mIU/mL     Blood Alcohol level:  Lab Results  Component Value Date   ETH <5 25/95/6387    Metabolic Disorder Labs: Lab Results  Component Value Date   HGBA1C 6.5 (H) 05/07/2018   MPG 139.85 05/07/2018   No results found for: PROLACTIN Lab Results  Component Value Date   CHOL 181 05/07/2018   TRIG 92 05/07/2018   HDL 46 05/07/2018   CHOLHDL 3.9 05/07/2018   VLDL 18 05/07/2018   LDLCALC 117 (H) 05/07/2018    Physical Findings: AIMS: Facial and Oral Movements Muscles of Facial Expression: None, normal Lips and Perioral Area: None, normal Jaw: None, normal Tongue: None, normal,Extremity Movements Upper (arms, wrists, hands, fingers): None, normal Lower (legs, knees, ankles, toes): None, normal, Trunk Movements Neck, shoulders, hips: None, normal, Overall Severity Severity of abnormal movements (highest score from questions above): None, normal Incapacitation due to abnormal movements: None, normal Patient's awareness of abnormal movements (rate only patient's report): No  Awareness, Dental Status Current problems with teeth and/or dentures?: No Does patient usually wear dentures?: No  CIWA:    COWS:     Musculoskeletal: Strength & Muscle Tone: within normal limits Gait & Station: normal Patient leans: N/A  Psychiatric Specialty Exam: Physical Exam  Nursing note and vitals reviewed. Constitutional: She appears well-developed and well-nourished.  HENT:  Head: Normocephalic and atraumatic.  Eyes: Pupils are equal, round, and reactive to light. Conjunctivae are normal.  Neck: Normal range of motion.  Cardiovascular: Regular rhythm and normal heart  sounds.  Respiratory: Effort normal.  GI: Soft.  Musculoskeletal: Normal range of motion.  Neurological: She is alert.  Skin: Skin is warm and dry.  Psychiatric: Judgment normal. Her affect is blunt. Her speech is delayed. She is slowed. Cognition and memory are normal. She expresses suicidal ideation. She expresses no suicidal plans.    Review of Systems  Constitutional: Negative.   HENT: Negative.   Eyes: Negative.   Respiratory: Negative.   Cardiovascular: Negative.   Gastrointestinal: Negative.   Musculoskeletal: Negative.   Skin: Negative.   Neurological: Negative.   Psychiatric/Behavioral: Positive for depression and suicidal ideas. Negative for hallucinations and substance abuse. The patient is nervous/anxious.     Blood pressure (!) 90/58, pulse (!) 101, temperature 97.8 F (36.6 C), temperature source Oral, resp. rate 18, height 5' 6" (1.676 m), weight 91.6 kg (202 lb), SpO2 100 %.Body mass index is 32.6 kg/m.  General Appearance: Casual  Eye Contact:  Good  Speech:  Slow  Volume:  Decreased  Mood:  Depressed  Affect:  Blunt  Thought Process:  Goal Directed  Orientation:  Full (Time, Place, and Person)  Thought Content:  Logical  Suicidal Thoughts:  Yes.  without intent/plan  Homicidal Thoughts:  No  Memory:  Immediate;   Fair Recent;   Fair Remote;   Fair  Judgement:  Fair   Insight:  Fair  Psychomotor Activity:  Decreased  Concentration:  Concentration: Fair  Recall:  [employer] of Knowledge:  Fair  Language:  Fair  Akathisia:  No  Handed:  Right  AIMS (if indicated):     Assets:  Desire for Improvement Housing Physical Health  ADL's:  Intact  Cognition:  WNL  Sleep:  Number of Hours: 5.5     Treatment Plan Summary: Daily contact with patient to assess and evaluate symptoms and progress in treatment, Medication management and Plan Supportive counseling and review of treatment plan.  Continue ECT next trip tomorrow.  Continue with all current his psychiatric medicine and management of diabetes  Alethia Berthold, MD 05/22/2018, 7:32 PM

## 2018-05-22 NOTE — Plan of Care (Addendum)
Patient found in day room upon my arrival. Patient is visible but not social this evening. Patient remains on the periphery of the day room but did attend group. Patient mood and affect are depressed and anxious again after some improvement yesterday. Patient continues to be more visible but less social than yesterday. Denies SI/HI/AVH. Denies pain. Reports eating and voiding adequately. Patient has no scheduled HS medications. Given Vistaril and Trazodone PRN for anxiety and sleep. Educated regarding NPO after midnight status. Verbalizes understanding. Compliant with staff direction and unit routine. Q 15 minute checks maintained. Will continue to monitor throughout the shift. Patient slept 7 hours. No apparent distress. NPO status maintained. CBG 91. Will endorse care to oncoming shift.  Problem: Coping: Goal: Coping ability will improve Outcome: Progressing Goal: Will verbalize feelings Outcome: Progressing   Problem: Education: Goal: Emotional status will improve Outcome: Progressing Goal: Mental status will improve Outcome: Progressing Goal: Verbalization of understanding the information provided will improve Outcome: Progressing

## 2018-05-22 NOTE — B[facility] Group Notes (Signed)
  05/22/2018  Time: 1PM  Type of Therapy/Topic:  Group Therapy:  Balance in Life  Participation Level:  Active  Description of Group:   This group will address the concept of balance and how it feels and looks when one is unbalanced. Patients will be encouraged to process areas in their lives that are out of balance and identify reasons for remaining unbalanced. Facilitators will guide patients in utilizing problem-solving interventions to address and correct the stressor making their life unbalanced. Understanding and applying boundaries will be explored and addressed for obtaining and maintaining a balanced life. Patients will be encouraged to explore ways to assertively make their unbalanced needs known to significant others in their lives, using other group members and facilitator for support and feedback.  Therapeutic Goals: 1. Patient will identify two or more emotions or situations they have that consume much of in their lives. 2. Patient will identify signs/triggers that life has become out of balance:  3. Patient will identify two ways to set boundaries in order to achieve balance in their lives:  4. Patient will demonstrate ability to communicate their needs through discussion and/or role plays  Summary of Patient Progress: Pt continues to work towards their tx goals but has not yet reached them. Pt was able to appropriately participate in group discussion, and was able to offer support/validation to other group members. Pt reports one area of her life she'd like to devote more attention to is, "my cats," and would like to devote less attention to, "doing things I don't want to do."   Therapeutic Modalities:   Cognitive Behavioral Therapy Solution-Focused Therapy Assertiveness Training  Heidi DachKelsey Tamrah Victorino, MSW, LCSW Clinical Social Worker 05/22/2018 2:01 PM

## 2018-05-22 NOTE — Progress Notes (Signed)
Recreation Therapy Notes  Date: 05/22/2018  Time: 3:00pm  Location: Craft room  Behavioral response: N/A  Group Type: Craft  Participation level: N/A  Communication: Patient did not attend group.  Comments: N/A  Jamia Hoban LRT/CTRS        Yavuz Kirby 05/22/2018 4:10 PM

## 2018-05-22 NOTE — Progress Notes (Signed)
Recreation Therapy Notes  Date: 05/22/2018  Time: 9:30 am   Location: Craft Room   Behavioral response: N/A   Intervention Topic: Problem Solving  Discussion/Intervention: Patient did not attend group.   Clinical Observations/Feedback:  Patient did not attend group.   [name] LRT/CTRS        Renee Turner 05/22/2018 12:33 PM 

## 2018-05-23 ENCOUNTER — Inpatient Hospital Stay: Payer: BLUE CROSS/BLUE SHIELD | Admitting: Anesthesiology

## 2018-05-23 ENCOUNTER — Other Ambulatory Visit: Payer: Self-pay | Admitting: Psychiatry

## 2018-05-23 ENCOUNTER — Encounter: Payer: Self-pay | Admitting: *Deleted

## 2018-05-23 LAB — GLUCOSE, CAPILLARY
GLUCOSE-CAPILLARY: 94 mg/dL (ref 70–99)
Glucose-Capillary: 91 mg/dL (ref 70–99)

## 2018-05-23 MED ORDER — SODIUM CHLORIDE 0.9 % IV SOLN
500.0000 mL | Freq: Once | INTRAVENOUS | Status: AC
  Administered 2018-05-23: 500 mL via INTRAVENOUS

## 2018-05-23 MED ORDER — METHOHEXITAL SODIUM 100 MG/10ML IV SOSY
PREFILLED_SYRINGE | INTRAVENOUS | Status: DC | PRN
  Administered 2018-05-23: 90 mg via INTRAVENOUS

## 2018-05-23 MED ORDER — ESMOLOL HCL 100 MG/10ML IV SOLN
INTRAVENOUS | Status: AC
  Filled 2018-05-23: qty 10

## 2018-05-23 MED ORDER — SUCCINYLCHOLINE CHLORIDE 20 MG/ML IJ SOLN
INTRAMUSCULAR | Status: AC
  Filled 2018-05-23: qty 1

## 2018-05-23 MED ORDER — ESMOLOL HCL 100 MG/10ML IV SOLN
INTRAVENOUS | Status: DC | PRN
  Administered 2018-05-23: 30 mg via INTRAVENOUS

## 2018-05-23 MED ORDER — GLYCOPYRROLATE 0.2 MG/ML IJ SOLN
0.1000 mg | Freq: Once | INTRAMUSCULAR | Status: AC
  Administered 2018-05-23: 0.1 mg via INTRAVENOUS

## 2018-05-23 MED ORDER — KETOROLAC TROMETHAMINE 30 MG/ML IJ SOLN
30.0000 mg | Freq: Once | INTRAMUSCULAR | Status: AC
  Administered 2018-05-23: 30 mg via INTRAVENOUS

## 2018-05-23 MED ORDER — KETOROLAC TROMETHAMINE 30 MG/ML IJ SOLN
INTRAMUSCULAR | Status: AC
  Administered 2018-05-23: 30 mg via INTRAVENOUS
  Filled 2018-05-23: qty 1

## 2018-05-23 MED ORDER — GLYCOPYRROLATE 0.2 MG/ML IJ SOLN
INTRAMUSCULAR | Status: AC
Start: 2018-05-23 — End: 2018-05-23
  Administered 2018-05-23: 0.1 mg via INTRAVENOUS
  Filled 2018-05-23: qty 1

## 2018-05-23 MED ORDER — SODIUM CHLORIDE 0.9 % IV SOLN
INTRAVENOUS | Status: DC | PRN
  Administered 2018-05-23: 10:00:00 via INTRAVENOUS

## 2018-05-23 MED ORDER — LABETALOL HCL 5 MG/ML IV SOLN
INTRAVENOUS | Status: AC
  Filled 2018-05-23: qty 4

## 2018-05-23 MED ORDER — LABETALOL HCL 5 MG/ML IV SOLN
INTRAVENOUS | Status: DC | PRN
  Administered 2018-05-23: 10 mg via INTRAVENOUS

## 2018-05-23 MED ORDER — SUCCINYLCHOLINE CHLORIDE 20 MG/ML IJ SOLN
INTRAMUSCULAR | Status: DC | PRN
  Administered 2018-05-23: 100 mg via INTRAVENOUS

## 2018-05-23 NOTE — Progress Notes (Signed)
Recreation Therapy Notes   Date: 05/23/2018  Time: 9:30 am   Location: Craft Room   Behavioral response: N/A   Intervention Topic: Leisure  Discussion/Intervention: Patient did not attend group.   Clinical Observations/Feedback:  Patient did not attend group.   Berlie Hatchel LRT/CTRS         Durk Carmen 05/23/2018 10:27 AM

## 2018-05-23 NOTE — Plan of Care (Signed)
Patient tolerated ECT.C/O throat pain after ECT ,asked for tylenol.Patient visible in the milieu,minimal interactions with peers.Affect is depressed,rated her depression 6/10.Appetite and energy level good.Attended groups.Support and encouragement given.

## 2018-05-23 NOTE — [facility] Group Notes (Signed)
[facility] Group Notes:  (Nursing/MHT/Case Management/Adjunct)  Date:  05/23/2018  Time:  2:47 PM  Type of Therapy:  Psychoeducational Skills  Participation Level:  Active  Participation Quality:  Appropriate, Attentive and Sharing  Affect:  Appropriate  Cognitive:  Alert and Appropriate  Insight:  Appropriate  Engagement in Group:  Engaged  Modes of Intervention:  Activity and Education  Summary of Progress/Problems:  Renee SmokeCara Turner [facility] HospitalMadoni 05/23/2018, 2:47 PM

## 2018-05-23 NOTE — Anesthesia Postprocedure Evaluation (Signed)
Anesthesia Post Note  Patient: Renee Turner  Procedure(s) Performed: ECT TX  Patient location during evaluation: PACU Anesthesia Type: General Level of consciousness: awake and alert Pain management: pain level controlled Vital Signs Assessment: post-procedure vital signs reviewed and stable Respiratory status: spontaneous breathing, nonlabored ventilation, respiratory function stable and patient connected to nasal cannula oxygen Cardiovascular status: blood pressure returned to baseline and stable Postop Assessment: no apparent nausea or vomiting Anesthetic complications: no     Last Vitals:  Vitals:   05/23/18 1057 05/23/18 1107  BP: 108/74   Pulse: (!) 104 (!) 103  Resp: (!) 21 19  Temp:    SpO2: 99% 100%    Last Pain:  Vitals:   05/23/18 1119  TempSrc:   PainSc: 0-No pain                  K      

## 2018-05-23 NOTE — Progress Notes (Signed)
D: Pt denies SI/HI/AVH. Pt is pleasant and cooperative during assessments. Pt. has no Complaints.  Patient Interaction is appropriate, but forwards little and is minimal. Pt. Does report feeling, "relaxed" this evening when asked how she is feeling, but does present with flat facial expression often. Pt. Denies all psychiatric symptoms. Pt. observed more social in the day room with peers and staff, smiling periodically. Pt. Observed coloring and participating good with snacks and unit activities. Pt. Denies any negative symptoms from ect. Denies nausea and/or vomiting. Pt. Orientation is good.    A: Q x 15 minute observation checks were completed for safety. Patient was provided with education.  Patient was given scheduled/prn medications. Patient  was encourage to attend groups, participate in unit activities and continue with plan of care. Pt. Chart and plans of care reviewed. Pt. Given support and encouragement.   R: Patient is complaint with medication and unit procedures.             Precautionary checks every 15 minutes for safety maintained, room free of safety hazards, patient sustains no injury or falls during this shift.

## 2018-05-23 NOTE — H&P (Signed)
Renee Turner is an 22 y.o. female.   Chief Complaint: Patient with severe depression and PTSD.  Currently still feeling depressed and down HPI: Recurrent severe depression with suicidal ideation  Past Medical History:  Diagnosis Date  . Anxiety   . Depression   . Diabetes mellitus without complication ([place])    Type II  . Diabetes mellitus, type II ([place])     History reviewed. No pertinent surgical history.  History reviewed. No pertinent family history. Social History:  reports that she has never smoked. She has never used smokeless tobacco. She reports that she drinks alcohol. She reports that she does not use drugs.  Allergies: No Known Allergies  Medications Prior to Admission  Medication Sig Dispense Refill  . exenatide (BYETTA) 5 MCG/0.02ML SOPN injection Inject 0.02 mLs (5 mcg total) into the skin 2 (two) times daily with a meal. For DM 1.2 mL 0  . ferrous sulfate 325 (65 FE) MG tablet Take 1 tablet (325 mg total) by mouth daily with breakfast. For iron deficiency anemia 30 tablet 0  . FLUoxetine (PROZAC) 40 MG capsule Take 1 capsule (40 mg total) by mouth daily. For depression 1 capsule 0  . gabapentin (NEURONTIN) 300 MG capsule Take 1 capsule (300 mg total) by mouth 3 (three) times daily. For pain 90 capsule 0  . hydrOXYzine (ATARAX/VISTARIL) 25 MG tablet Take 1 tablet (25 mg total) by mouth 3 (three) times daily as needed for anxiety. 30 tablet 0  . metformin (FORTAMET) 1000 MG (OSM) 24 hr tablet Take 1 tablet (1,000 mg total) by mouth 2 (two) times daily with a meal. For DM 60 tablet 0  . protriptyline (VIVACTIL) 10 MG tablet Take 1 tablet (10 mg total) by mouth 3 (three) times daily. Narcolepsy 90 tablet 0  . traZODone (DESYREL) 50 MG tablet Take 1 tablet (50 mg total) by mouth at bedtime. Take 2 hours before bedtime: For sleep 1 tablet 0  . traZODone (DESYREL) 50 MG tablet Take 1 tablet (50 mg total) by mouth at bedtime as needed for sleep. 30 tablet 0    Results for orders  placed or performed during the hospital encounter of 05/14/18 (from the past 48 hour(s))  Glucose, capillary     Status: None   Collection Time: 05/23/18  6:44 AM  Result Value Ref Range   Glucose-Capillary 91 70 - 99 mg/dL   Comment 1 Notify RN    No results found.  Review of Systems  Constitutional: Negative.   HENT: Negative.   Eyes: Negative.   Respiratory: Negative.   Cardiovascular: Negative.   Gastrointestinal: Negative.   Musculoskeletal: Negative.   Skin: Negative.   Neurological: Negative.   Psychiatric/Behavioral: Positive for depression and suicidal ideas. Negative for hallucinations, memory loss and substance abuse. The patient is nervous/anxious. The patient does not have insomnia.     Blood pressure 123/87, pulse 98, temperature 98.9 F (37.2 C), temperature source Oral, resp. rate 16, height '5\' 6"'$  (1.676 m), weight 92.5 kg (204 lb), SpO2 100 %. Physical Exam  Nursing note and vitals reviewed. Constitutional: She appears well-developed and well-nourished.  HENT:  Head: Normocephalic and atraumatic.  Eyes: Pupils are equal, round, and reactive to light. Conjunctivae are normal.  Neck: Normal range of motion.  Cardiovascular: Regular rhythm and normal heart sounds.  Respiratory: Effort normal. No respiratory distress.  GI: Soft.  Musculoskeletal: Normal range of motion.  Neurological: She is alert.  Skin: Skin is warm and dry.  Psychiatric: Judgment normal. Her  affect is blunt. Her speech is delayed. She is slowed and withdrawn. Cognition and memory are impaired. She expresses suicidal ideation. She expresses no suicidal plans.     Assessment/Plan No new physical complaints no change to physical exam.  Continue plan for inpatient ECT at this time  Alethia Berthold, MD 05/23/2018, 10:31 AM

## 2018-05-23 NOTE — Tx Team (Signed)
Interdisciplinary Treatment and Diagnostic Plan Update  05/23/2018 Time of Session: Renee Turner MRN: [id number]  Principal Diagnosis: Severe recurrent major depression without psychotic features ([place])  Secondary Diagnoses: Principal Problem:   Severe recurrent major depression without psychotic features ([place]) Active Problems:   Diabetes ([place])   Posttraumatic stress disorder   Diabetic neuropathy associated with type 2 diabetes mellitus ([place])   Current Medications:  Current Facility-Administered Medications  Medication Dose Route Frequency Provider Last Rate Last Dose  . acetaminophen (TYLENOL) tablet 650 mg  650 mg Oral Q6H PRN Clapacs, Madie Reno, MD   650 mg at 05/16/18 2115  . alum & mag hydroxide-simeth (MAALOX/MYLANTA) 200-200-20 MG/5ML suspension 30 mL  30 mL Oral Q4H PRN Clapacs, John T, MD      . exenatide (BYETTA) immediate release injection  5 mcg Subcutaneous BID WC Clapacs, Madie Reno, MD   Stopped at 05/16/18 [phone]  . ferrous sulfate tablet 325 mg  325 mg Oral Q breakfast Clapacs, Madie Reno, MD   325 mg at 05/22/18 0831  . FLUoxetine (PROZAC) capsule 40 mg  40 mg Oral Daily Clapacs, Madie Reno, MD   40 mg at 05/22/18 0831  . gabapentin (NEURONTIN) capsule 300 mg  300 mg Oral TID Clapacs, John T, MD   300 mg at 05/22/18 1710  . hydrOXYzine (ATARAX/VISTARIL) tablet 25 mg  25 mg Oral TID PRN Clapacs, Madie Reno, MD   25 mg at 05/22/18 2131  . magnesium hydroxide (MILK OF MAGNESIA) suspension 30 mL  30 mL Oral Daily PRN Clapacs, Madie Reno, MD   30 mL at 05/18/18 2219  . metFORMIN (GLUCOPHAGE-XR) 24 hr tablet 1,000 mg  1,000 mg Oral BID WC Clapacs, John T, MD   1,000 mg at 05/22/18 1710  . protriptyline (VIVACTIL) tablet 10 mg  10 mg Oral TID Clapacs, Madie Reno, MD   Stopped at 05/16/18 [phone]  . traZODone (DESYREL) tablet 50 mg  50 mg Oral QHS PRN Clapacs, Madie Reno, MD   50 mg at 05/22/18 2131   PTA Medications: Medications Prior to Admission  Medication Sig Dispense Refill Last Dose  . exenatide  (BYETTA) 5 MCG/0.02ML SOPN injection Inject 0.02 mLs (5 mcg total) into the skin 2 (two) times daily with a meal. For DM 1.2 mL 0 05/20/2018  . ferrous sulfate 325 (65 FE) MG tablet Take 1 tablet (325 mg total) by mouth daily with breakfast. For iron deficiency anemia 30 tablet 0 05/20/2018  . FLUoxetine (PROZAC) 40 MG capsule Take 1 capsule (40 mg total) by mouth daily. For depression 1 capsule 0 05/20/2018  . gabapentin (NEURONTIN) 300 MG capsule Take 1 capsule (300 mg total) by mouth 3 (three) times daily. For pain 90 capsule 0 05/20/2018  . hydrOXYzine (ATARAX/VISTARIL) 25 MG tablet Take 1 tablet (25 mg total) by mouth 3 (three) times daily as needed for anxiety. 30 tablet 0 05/20/2018  . metformin (FORTAMET) 1000 MG (OSM) 24 hr tablet Take 1 tablet (1,000 mg total) by mouth 2 (two) times daily with a meal. For DM 60 tablet 0 05/20/2018  . protriptyline (VIVACTIL) 10 MG tablet Take 1 tablet (10 mg total) by mouth 3 (three) times daily. Narcolepsy 90 tablet 0 05/20/2018  . traZODone (DESYREL) 50 MG tablet Take 1 tablet (50 mg total) by mouth at bedtime. Take 2 hours before bedtime: For sleep 1 tablet 0 05/20/2018  . traZODone (DESYREL) 50 MG tablet Take 1 tablet (50 mg total) by mouth at bedtime as needed for sleep.  30 tablet 0 05/20/2018 at Unknown time    Patient Stressors: Health problems Medication change or noncompliance  Patient Strengths: Ability for insight Average or above average intelligence Capable of independent living Communication skills Supportive family/friends  Treatment Modalities: Medication Management, Group therapy, Case management,  1 to 1 session with clinician, Psychoeducation, Recreational therapy.   Physician Treatment Plan for Primary Diagnosis: Severe recurrent major depression without psychotic features ([place]) Long Term Goal(s): Improvement in symptoms so as ready for discharge Improvement in symptoms so as ready for discharge   Short Term Goals: Ability to  verbalize feelings will improve Ability to disclose and discuss suicidal ideas Ability to maintain clinical measurements within normal limits will improve Compliance with prescribed medications will improve  Medication Management: Evaluate patient's response, side effects, and tolerance of medication regimen.  Therapeutic Interventions: 1 to 1 sessions, Unit Group sessions and Medication administration.  Evaluation of Outcomes: Progressing  Physician Treatment Plan for Secondary Diagnosis: Principal Problem:   Severe recurrent major depression without psychotic features ([place]) Active Problems:   Diabetes ([place])   Posttraumatic stress disorder   Diabetic neuropathy associated with type 2 diabetes mellitus ([place])  Long Term Goal(s): Improvement in symptoms so as ready for discharge Improvement in symptoms so as ready for discharge   Short Term Goals: Ability to verbalize feelings will improve Ability to disclose and discuss suicidal ideas Ability to maintain clinical measurements within normal limits will improve Compliance with prescribed medications will improve     Medication Management: Evaluate patient's response, side effects, and tolerance of medication regimen.  Therapeutic Interventions: 1 to 1 sessions, Unit Group sessions and Medication administration.  Evaluation of Outcomes: Progressing   RN Treatment Plan for Primary Diagnosis: Severe recurrent major depression without psychotic features ([place]) Long Term Goal(s): Knowledge of disease and therapeutic regimen to maintain health will improve  Short Term Goals: Ability to verbalize feelings will improve, Ability to identify and develop effective coping behaviors will improve and Compliance with prescribed medications will improve  Medication Management: RN will administer medications as ordered by provider, will assess and evaluate patient's response and provide education to patient for prescribed medication. RN will report  any adverse and/or side effects to prescribing provider.  Therapeutic Interventions: 1 on 1 counseling sessions, Psychoeducation, Medication administration, Evaluate responses to treatment, Monitor vital signs and CBGs as ordered, Perform/monitor CIWA, COWS, AIMS and Fall Risk screenings as ordered, Perform wound care treatments as ordered.  Evaluation of Outcomes: Progressing   LCSW Treatment Plan for Primary Diagnosis: Severe recurrent major depression without psychotic features ([place]) Long Term Goal(s): Safe transition to appropriate next level of care at discharge, Engage patient in therapeutic group addressing interpersonal concerns.  Short Term Goals: Engage patient in aftercare planning with referrals and resources, Increase emotional regulation and Increase skills for wellness and recovery  Therapeutic Interventions: Assess for all discharge needs, 1 to 1 time with Social worker, Explore available resources and support systems, Assess for adequacy in community support network, Educate family and significant other(s) on suicide prevention, Complete Psychosocial Assessment, Interpersonal group therapy.  Evaluation of Outcomes: Progressing   Progress in Treatment: Attending groups: Yes. Participating in groups: Yes. Taking medication as prescribed: Yes. Toleration medication: Yes. Family/Significant other contact made: No, will contact:  a support if pt provides consent. Patient understands diagnosis: Yes. Discussing patient identified problems/goals with staff: Yes. Medical problems stabilized or resolved: Yes. Denies suicidal/homicidal ideation: Yes. Issues/concerns per patient self-inventory: No. Other: None at this time.   New problem(s) identified:  No, Describe:  none at this time  New Short Term/Long Term Goal(s): Pt will report a decrease in depression by 24 hours prior to discharge.   Patient Goals:  Pt reported her goal for treatment is to, "work on my depression and  feel better. I'm here to get ECT."  Discharge Plan or Barriers: Pt will continue tx with her current tx providers [facility] tx and a therapist with [facility].   Reason for Continuation of Hospitalization: Depression Medication stabilization  Estimated Length of Stay: 7 days  Recreational Therapy: Patient Stressors: N/A  Patient Goal: Patient will identify 3 triggers for depression within 5 recreation therapy group sessions  Attendees: Patient: Renee Turner 05/23/2018 11:19 AM  Physician:  05/23/2018 11:19 AM  Nursing:  05/23/2018 11:19 AM  RN Care Manager: 05/23/2018 11:19 AM  Social Worker: Alden Hipp, LCSW 05/23/2018 11:19 AM  Recreational Therapist:  05/23/2018 11:19 AM  Other:  05/23/2018 11:19 AM  Other:  05/23/2018 11:19 AM  Other: 05/23/2018 11:19 AM    Scribe for Treatment Team: Alden Hipp, LCSW 05/23/2018 11:19 AM

## 2018-05-23 NOTE — Transfer of Care (Signed)
Immediate Anesthesia Transfer of Care Note  Patient: Renee Turner  Procedure(s) Performed: ECT TX  Patient Location: PACU  Anesthesia Type:General  Level of Consciousness: drowsy and patient cooperative  Airway & Oxygen Therapy: Patient Spontanous Breathing  Post-op Assessment: Report given to RN and Post -op Vital signs reviewed and stable  Post vital signs: Reviewed and stable  Last Vitals:  Vitals Value Taken Time  BP 120/78 05/23/2018 10:39 AM  Temp    Pulse 107 05/23/2018 10:39 AM  Resp 22 05/23/2018 10:39 AM  SpO2 96 % 05/23/2018 10:39 AM  Vitals shown include unvalidated device data.  Last Pain:  Vitals:   05/23/18 0854  TempSrc: Oral  PainSc: 0-No pain         Complications: No apparent anesthesia complications

## 2018-05-23 NOTE — [facility] Group Notes (Signed)
05/23/2018 1PM  Type of Therapy and Topic:  Group Therapy:  Feelings around Relapse and Recovery  Participation Level:  Active   Description of Group:    Patients in this group will discuss emotions they experience before and after a relapse. They will process how experiencing these feelings, or avoidance of experiencing them, relates to having a relapse. Facilitator will guide patients to explore emotions they have related to recovery. Patients will be encouraged to process which emotions are more powerful. They will be guided to discuss the emotional reaction significant others in their lives may have to patients' relapse or recovery. Patients will be assisted in exploring ways to respond to the emotions of others without this contributing to a relapse.  Therapeutic Goals: 1. Patient will identify two or more emotions that lead to a relapse for them 2. Patient will identify two emotions that result when they relapse 3. Patient will identify two emotions related to recovery 4. Patient will demonstrate ability to communicate their needs through discussion and/or role plays   Summary of Patient Progress: Actively and appropriately engaged in the group. Patient was able to provide support and validation to other group members.Patient practiced active listening when interacting with the facilitator and other group members. Pt. Reports a coping skill that she can use while working towards recovery is "hiking up the mountains". Patient is still in the process of obtaining treatment goals.     Therapeutic Modalities:   Cognitive Behavioral Therapy Solution-Focused Therapy Assertiveness Training Relapse Prevention Therapy   [name]  Barrett Goldie, LCSW 05/23/2018 2:21 PM

## 2018-05-23 NOTE — Plan of Care (Signed)
Pt. Observed this evening socializing with peers and coloring in the day room utilizing coping skills much more effectively. Pt. Denies SI/HI. Pt. Verbally is able to contract for safety. Pt. Verbalizes understanding of provided education. Pt. During our conversations as well as with staff and peers smiles frequently and reports feeling, "relaxed" this evening. Pt. Attends snacks and groups appropriately.    Problem: Coping: Goal: Coping ability will improve Outcome: Progressing   Problem: Safety: Goal: Ability to disclose and discuss suicidal ideas will improve Outcome: Progressing   Problem: Education: Goal: Knowledge of [place] [facility] Education information/materials will improve Outcome: Progressing Goal: Emotional status will improve Outcome: Progressing Goal: Mental status will improve Outcome: Progressing

## 2018-05-23 NOTE — Anesthesia Preprocedure Evaluation (Signed)
Anesthesia Evaluation  Patient identified by MRN, date of birth, ID band Patient awake    Reviewed: Allergy & Precautions, NPO status , Patient's Chart, lab work & pertinent test results  History of Anesthesia Complications Negative for: history of anesthetic complications  Airway Mallampati: II  TM Distance: >3 FB Neck ROM: full    Dental  (+) Poor Dentition, Chipped   Pulmonary neg pulmonary ROS, neg shortness of breath,    Pulmonary exam normal breath sounds clear to auscultation       Cardiovascular (-) Past MI negative cardio ROS Normal cardiovascular exam Rhythm:Regular Rate:Normal     Neuro/Psych PSYCHIATRIC DISORDERS Anxiety Depression negative neurological ROS  negative psych ROS   GI/Hepatic negative GI ROS, Neg liver ROS,   Endo/Other  diabetes, Well Controlled, Type 2  Renal/GU negative Renal ROS  negative genitourinary   Musculoskeletal negative musculoskeletal ROS (+)   Abdominal Normal abdominal exam  (+)   Peds negative pediatric ROS (+)  Hematology negative hematology ROS (+)   Anesthesia Other Findings Past Medical History: No date: Anxiety No date: Depression No date: Diabetes mellitus without complication ([name])     Comment:  Type II No date: Diabetes mellitus, type II ([name])  BMI    Body Mass Index:  32.93 kg/m      Reproductive/Obstetrics negative OB ROS                             Anesthesia Physical  Anesthesia Plan  ASA: III  Anesthesia Plan: General   Post-op Pain Management:    Induction: Intravenous  PONV Risk Score and Plan: TIVA  Airway Management Planned: Mask and Natural Airway  Additional Equipment:   Intra-op Plan:   Post-operative Plan:   Informed Consent: I have reviewed the patients History and Physical, chart, labs and discussed the procedure including the risks, benefits and alternatives for the proposed anesthesia with the  patient or authorized representative who has indicated his/her understanding and acceptance.   Dental advisory given  Plan Discussed with: CRNA and Surgeon  Anesthesia Plan Comments: (Patient consented for risks of anesthesia including but not limited to:  - adverse reactions to medications - risk of intubation if required - damage to teeth, lips or other oral mucosa - sore throat or hoarseness - Damage to heart, brain, lungs or loss of life  Patient voiced understanding.)        Anesthesia Quick Evaluation  

## 2018-05-23 NOTE — Anesthesia Post-op Follow-up Note (Signed)
Anesthesia QCDR form completed.        

## 2018-05-23 NOTE — Progress Notes (Signed)
CSW received a message that pt's employer, [employer], would like CSW to contact her regarding FMLA paperwork. CSW attempted to contact Mrs. Cranford at [phone]; however, she did not answer and her voicemail was full. CSW will continue to coordinate with pt's employer as needed for updates/discharge planning.   Heidi DachKelsey Jayna Mulnix, MSW, LCSW Clinical Social Worker 05/23/2018 11:35 AM

## 2018-05-23 NOTE — Progress Notes (Signed)
[facility] MD Progress Note  05/23/2018 4:09 PM Renee Turner  MRN:  [id number] Subjective: Follow-up for this 22 year old woman with severe depression and PTSD.  Patient had ECT treatment this morning which was without any complication.  She reports that perhaps she is slightly better but overall still feels very depressed and down.  Passive suicidal thoughts.  Little hope for the future.  No side effects noted from the ECT.  Blood sugars are staying stable. Principal Problem: Severe recurrent major depression without psychotic features ([place]) Diagnosis:   Patient Active Problem List   Diagnosis Date Noted  . Severe recurrent major depression without psychotic features ([place]) [F33.2] 05/14/2018  . MDD (major depressive disorder), recurrent severe, without psychosis ([place]) [F33.2] 05/06/2018  . 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 Posttraumatic stress disorder [F43.10] 11/13/2016  . Severe episode of recurrent major depressive disorder, without psychotic features ([place]) [F33.2] 11/07/2016  . Diabetes ([place]) [E11.9] 11/07/2016  . Diabetic neuropathy associated with type 2 diabetes mellitus ([place]) [E11.40] 07/19/2016  . Obesity (BMI 30.0-34.9) [E66.9] 07/19/2016  . Type 2 diabetes mellitus with hyperglycemia, without long-term current use of insulin ([name]) [E11.65] 07/19/2016   Total Time spent with patient: 20 minutes  Past Psychiatric History: History of several prior hospitalizations and suicide attempts  Past Medical History:  Past Medical History:  Diagnosis Date  . Anxiety   . Depression   . Diabetes mellitus without complication ([place])    Type II  . Diabetes mellitus, type II ([place])    History reviewed. No pertinent surgical history. Family History: History reviewed. No pertinent family history. Family Psychiatric  History: See previous note Social History:  Social History   Substance and Sexual Activity  Alcohol Use Yes     Social History   Substance and Sexual Activity   Drug Use No    Social History   Socioeconomic History  . Marital status: Single    Spouse name: Not on file  . Number of children: Not on file  . Years of education: Not on file  . Highest education level: Not on file  Occupational History  . Not on file  Social Needs  . Financial resource strain: Not on file  . Food insecurity:    Worry: Not on file    Inability: Not on file  . Transportation needs:    Medical: Not on file    Non-medical: Not on file  Tobacco Use  . Smoking status: Never Smoker  . Smokeless tobacco: Never Used  Substance and Sexual Activity  . Alcohol use: Yes  . Drug use: No  . Sexual activity: Yes    Birth control/protection: None  Lifestyle  . Physical activity:    Days per week: Not on file    Minutes per session: Not on file  . Stress: Not on file  Relationships  . Social connections:    Talks on phone: Not on file    Gets together: Not on file    Attends religious service: Not on file    Active member of club or organization: Not on file    Attends meetings of clubs or organizations: Not on file    Relationship status: Not on file  Other Topics Concern  . Not on file  Social History Narrative  . Not on file   Additional Social History:                         Sleep: Fair  Appetite:  Fair  Current Medications: Current Facility-Administered Medications  Medication Dose Route Frequency Provider Last Rate Last Dose  . acetaminophen (TYLENOL) tablet 650 mg  650 mg Oral Q6H PRN Tiari Andringa, Madie Reno, MD   650 mg at 05/23/18 1448  . alum & mag hydroxide-simeth (MAALOX/MYLANTA) 200-200-20 MG/5ML suspension 30 mL  30 mL Oral Q4H PRN Nikea Settle T, MD      . exenatide (BYETTA) immediate release injection  5 mcg Subcutaneous BID WC Skyler Dusing, Madie Reno, MD   Stopped at 05/16/18 [phone]  . ferrous sulfate tablet 325 mg  325 mg Oral Q breakfast Audrie Kuri, Madie Reno, MD   325 mg at 05/23/18 1157  . FLUoxetine (PROZAC) capsule 40 mg  40 mg Oral Daily  Eldora Napp, Madie Reno, MD   40 mg at 05/23/18 1157  . gabapentin (NEURONTIN) capsule 300 mg  300 mg Oral TID Romana Deaton T, MD   300 mg at 05/23/18 1157  . hydrOXYzine (ATARAX/VISTARIL) tablet 25 mg  25 mg Oral TID PRN Prudencio Velazco, Madie Reno, MD   25 mg at 05/22/18 2131  . magnesium hydroxide (MILK OF MAGNESIA) suspension 30 mL  30 mL Oral Daily PRN Emarion Toral, Madie Reno, MD   30 mL at 05/18/18 2219  . metFORMIN (GLUCOPHAGE-XR) 24 hr tablet 1,000 mg  1,000 mg Oral BID WC Ved Martos T, MD   1,000 mg at 05/23/18 1158  . protriptyline (VIVACTIL) tablet 10 mg  10 mg Oral TID Maiya Kates, Madie Reno, MD   Stopped at 05/16/18 [phone]  . traZODone (DESYREL) tablet 50 mg  50 mg Oral QHS PRN Rashaan Wyles, Madie Reno, MD   50 mg at 05/22/18 2131    Lab Results:  Results for orders placed or performed during the hospital encounter of 05/14/18 (from the past 48 hour(s))  Glucose, capillary     Status: None   Collection Time: 05/23/18  6:44 AM  Result Value Ref Range   Glucose-Capillary 91 70 - 99 mg/dL   Comment 1 Notify RN   Glucose, capillary     Status: None   Collection Time: 05/23/18 11:00 AM  Result Value Ref Range   Glucose-Capillary 94 70 - 99 mg/dL    Blood Alcohol level:  Lab Results  Component Value Date   ETH <5 54/07/8118    Metabolic Disorder Labs: Lab Results  Component Value Date   HGBA1C 6.5 (H) 05/07/2018   MPG 139.85 05/07/2018   No results found for: PROLACTIN Lab Results  Component Value Date   CHOL 181 05/07/2018   TRIG 92 05/07/2018   HDL 46 05/07/2018   CHOLHDL 3.9 05/07/2018   VLDL 18 05/07/2018   LDLCALC 117 (H) 05/07/2018    Physical Findings: AIMS: Facial and Oral Movements Muscles of Facial Expression: None, normal Lips and Perioral Area: None, normal Jaw: None, normal Tongue: None, normal,Extremity Movements Upper (arms, wrists, hands, fingers): None, normal Lower (legs, knees, ankles, toes): None, normal, Trunk Movements Neck, shoulders, hips: None, normal, Overall  Severity Severity of abnormal movements (highest score from questions above): None, normal Incapacitation due to abnormal movements: None, normal Patient's awareness of abnormal movements (rate only patient's report): No Awareness, Dental Status Current problems with teeth and/or dentures?: No Does patient usually wear dentures?: No  CIWA:    COWS:     Musculoskeletal: Strength & Muscle Tone: within normal limits Gait & Station: normal Patient leans: N/A  Psychiatric Specialty Exam: Physical Exam  Nursing note and vitals reviewed. Constitutional: She appears well-developed and well-nourished.  HENT:  Head: Normocephalic and atraumatic.  Eyes: Pupils are equal, round, and reactive to light. Conjunctivae are normal.  Neck: Normal range of motion.  Cardiovascular: Regular rhythm and normal heart sounds.  Respiratory: Effort normal.  GI: Soft.  Musculoskeletal: Normal range of motion.  Neurological: She is alert.  Skin: Skin is warm and dry.  Psychiatric: Judgment normal. Her affect is blunt. Her speech is delayed. She is slowed. Cognition and memory are impaired. She exhibits a depressed mood. She expresses suicidal ideation. She expresses no suicidal plans. She exhibits abnormal recent memory.    Review of Systems  Constitutional: Negative.   HENT: Negative.   Eyes: Negative.   Respiratory: Negative.   Cardiovascular: Negative.   Gastrointestinal: Negative.   Musculoskeletal: Negative.   Skin: Negative.   Neurological: Negative.   Psychiatric/Behavioral: Positive for depression, memory loss and suicidal ideas. Negative for hallucinations and substance abuse. The patient is nervous/anxious and has insomnia.     Blood pressure 111/78, pulse (!) 109, temperature 98.3 F (36.8 C), temperature source Oral, resp. rate 18, height '5\' 6"'  (1.676 m), weight 92.5 kg (204 lb), SpO2 98 %.Body mass index is 32.93 kg/m.  General Appearance: Casual  Eye Contact:  Fair  Speech:  Slow   Volume:  Decreased  Mood:  Depressed and Dysphoric  Affect:  Congruent  Thought Process:  Coherent  Orientation:  Full (Time, Place, and Person)  Thought Content:  Logical  Suicidal Thoughts:  Yes.  without intent/plan  Homicidal Thoughts:  No  Memory:  Immediate;   Fair Recent;   Fair Remote;   Fair  Judgement:  Fair  Insight:  Fair  Psychomotor Activity:  Decreased  Concentration:  Concentration: Fair  Recall:  [employer] of Knowledge:  Fair  Language:  Fair  Akathisia:  No  Handed:  Right  AIMS (if indicated):     Assets:  Desire for Improvement Housing Physical Health Resilience  ADL's:  Intact  Cognition:  WNL  Sleep:  Number of Hours: 7     Treatment Plan Summary: Daily contact with patient to assess and evaluate symptoms and progress in treatment, Medication management and Plan Patient again received ECT treatment.  We are still early in the course.  Counseling completed with the patient encouraged her to continue involvement in the groups.  No change to medicine for today.  Continue current management of medical problems including blood sugars.  Next ECT Monday  Alethia Berthold, MD 05/23/2018, 4:09 PM

## 2018-05-23 NOTE — Procedures (Signed)
ECT SERVICES Physician's Interval Evaluation & Treatment Note  Patient Identification: Renee Turner MRN:  [id number] Date of Evaluation:  05/23/2018 TX #: 3  MADRS:   MMSE:   P.E. Findings:  No change physical exam  Psychiatric Interval Note:  Still flat and depressed  Subjective:  Patient is a 22 y.o. female seen for evaluation for Electroconvulsive Therapy. Still depressed passive suicidal ideation no  Treatment Summary:   _0   Right Unilateral             _1  Bilateral   % Energy : 0.3 ms 25%   Impedance: 1320 ohms  Seizure Energy Index: 10,852 V squared  Postictal Suppression Index: No reading  Seizure Concordance Index: 98%  Medications  Pre Shock: Robinul 0.1 mg Toradol 30 mg esmolol 30 mg labetalol 10 mg Brevital 90 mg succinylcholine 100 mg  Post Shock:    Seizure Duration: 31 seconds by EMG 78 seconds by EEG   Comments: Follow-up Monday  Lungs:  _2   Clear to auscultation               _3  Other:   Heart:    _4   Regular rhythm             _5  irregular rhythm    _6   Previous H&P reviewed, patient examined and there are NO CHANGES                 _7   Previous H&P reviewed, patient examined and there are changes noted.   Renee Berthold, MD 7/19/201910:33 AM

## 2018-05-24 NOTE — [facility] Group Notes (Signed)
LCSW Group Therapy Note  05/24/2018 1:15pm  Type of Therapy and Topic: Group Therapy: Holding on to Grudges   Participation Level: Did Not Attend   Description of Group:  In this group patients will be asked to explore and define a grudge. Patients will be guided to discuss their thoughts, feelings, and reasons as to why people have grudges. Patients will process the impact grudges have on daily life and identify thoughts and feelings related to holding grudges. Facilitator will challenge patients to identify ways to let go of grudges and the benefits this provides. Patients will be confronted to address why one struggles letting go of grudges. Lastly, patients will identify feelings and thoughts related to what life would look like without grudges. This group will be process-oriented, with patients participating in exploration of their own experiences, giving and receiving support, and processing challenge from other group members.  Therapeutic Goals:  1. Patient will identify specific grudges related to their personal life.  2. Patient will identify feelings, thoughts, and beliefs around grudges.  3. Patient will identify how one releases grudges appropriately.  4. Patient will identify situations where they could have let go of the grudge, but instead chose to hold on.   Summary of Patient Progress: Pt was invited to attend group but chose not to attend. CSW will continue to encourage pt to attend group throughout their admission.    Therapeutic Modalities:  Cognitive Behavioral Therapy  Solution Focused Therapy  Motivational Interviewing  Brief Therapy   [place] Alvelo  CUEBAS-COLON, LCSW 05/24/2018 10:18 AM  

## 2018-05-24 NOTE — Plan of Care (Signed)
Continue to work on her coping  skills , beginning to verbalize  needs  and feelings . Patient has continually  denied  suicidal ideations . Patient  able to identify support from home with family . Information given to patient  in concrete form  for better understanding . Emotional and mental status improved .  Problem: Coping: Goal: Coping ability will improve Outcome: Progressing Goal: Will verbalize feelings Outcome: Progressing   Problem: Safety: Goal: Ability to disclose and discuss suicidal ideas will improve Outcome: Progressing Goal: Ability to identify and utilize support systems that promote safety will improve Outcome: Progressing   Problem: Education: Goal: Knowledge of [place] [facility] Education information/materials will improve Outcome: Progressing Goal: Emotional status will improve Outcome: Progressing Goal: Mental status will improve Outcome: Progressing Goal: Verbalization of understanding the information provided will improve Outcome: Progressing

## 2018-05-24 NOTE — Progress Notes (Signed)
D: Affect cheerful on approach . Noted to interact  With he peers more  This am shift .  Appropriate ADL'S and personal chores : Patient stated slept good last night .Stated appetite is good and energy level  Is normal. Stated concentration is good . Stated on Depression scale 1 , hopeless 0 and anxiety 0 .( low 0-10 high) Denies suicidal  homicidal ideations  .  No auditory hallucinations  No pain concerns .  Interacting with peers and staff.  Patient goal today  " Stay out of her room "  Continue to work on her coping  skills , beginning to verbalize  needs  and feelings . Patient has continually  denied  suicidal ideations . Patient  able to identify support from home with family . Information given to patient  in concrete form  for better understanding . Emotional and mental status improved .  A: Encourage patient participation with unit programming . Instruction  Given on  Medication , verbalize understanding.  R: Voice no other concerns. Staff continue to monitor

## 2018-05-24 NOTE — Progress Notes (Signed)
[facility] MD Progress Note  05/24/2018 1:34 PM Renee Turner  MRN:  [id number] Subjective: Follow-up 22 year old woman with major depression and PTSD.  Patient does not have specific new complaints.  Mood continues to be depressed and anxious although today she denies any suicidal thoughts.  Patient is only vaguely aware of whether there has been any improvement with ECT.  Nursing reports that the patient has been more outgoing and interactive and does not seem as depressed as when she came in.  No new physical complaints. Principal Problem: Severe recurrent major depression without psychotic features ([place]) Diagnosis:   Patient Active Problem List   Diagnosis Date Noted  . Severe recurrent major depression without psychotic features ([place]) [F33.2] 05/14/2018  . MDD (major depressive disorder), recurrent severe, without psychosis ([place]) [F33.2] 05/06/2018  . 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 Posttraumatic stress disorder [F43.10] 11/13/2016  . Severe episode of recurrent major depressive disorder, without psychotic features ([place]) [F33.2] 11/07/2016  . Diabetes ([place]) [E11.9] 11/07/2016  . Diabetic neuropathy associated with type 2 diabetes mellitus ([place]) [E11.40] 07/19/2016  . Obesity (BMI 30.0-34.9) [E66.9] 07/19/2016  . Type 2 diabetes mellitus with hyperglycemia, without long-term current use of insulin ([name]) [E11.65] 07/19/2016   Total Time spent with patient: 20 minutes  Past Psychiatric History: Patient has a long history of depression and PTSD without major response to medicine  Past Medical History:  Past Medical History:  Diagnosis Date  . Anxiety   . Depression   . Diabetes mellitus without complication ([place])    Type II  . Diabetes mellitus, type II ([place])    History reviewed. No pertinent surgical history. Family History: History reviewed. No pertinent family history. Family Psychiatric  History: See previous note Social History:  Social History   Substance and  Sexual Activity  Alcohol Use Yes     Social History   Substance and Sexual Activity  Drug Use No    Social History   Socioeconomic History  . Marital status: Single    Spouse name: Not on file  . Number of children: Not on file  . Years of education: Not on file  . Highest education level: Not on file  Occupational History  . Not on file  Social Needs  . Financial resource strain: Not on file  . Food insecurity:    Worry: Not on file    Inability: Not on file  . Transportation needs:    Medical: Not on file    Non-medical: Not on file  Tobacco Use  . Smoking status: Never Smoker  . Smokeless tobacco: Never Used  Substance and Sexual Activity  . Alcohol use: Yes  . Drug use: No  . Sexual activity: Yes    Birth control/protection: None  Lifestyle  . Physical activity:    Days per week: Not on file    Minutes per session: Not on file  . Stress: Not on file  Relationships  . Social connections:    Talks on phone: Not on file    Gets together: Not on file    Attends religious service: Not on file    Active member of club or organization: Not on file    Attends meetings of clubs or organizations: Not on file    Relationship status: Not on file  Other Topics Concern  . Not on file  Social History Narrative  . Not on file   Additional Social History:  Sleep: Good  Appetite:  Good  Current Medications: Current Facility-Administered Medications  Medication Dose Route Frequency Provider Last Rate Last Dose  . acetaminophen (TYLENOL) tablet 650 mg  650 mg Oral Q6H PRN Clapacs, Madie Reno, MD   650 mg at 05/23/18 1448  . alum & mag hydroxide-simeth (MAALOX/MYLANTA) 200-200-20 MG/5ML suspension 30 mL  30 mL Oral Q4H PRN Clapacs, John T, MD      . exenatide (BYETTA) immediate release injection  5 mcg Subcutaneous BID WC Clapacs, Madie Reno, MD   Stopped at 05/16/18 [phone]  . ferrous sulfate tablet 325 mg  325 mg Oral Q breakfast Clapacs, Madie Reno, MD   325 mg at 05/24/18 0752  . FLUoxetine (PROZAC) capsule 40 mg  40 mg Oral Daily Clapacs, Madie Reno, MD   40 mg at 05/24/18 0752  . gabapentin (NEURONTIN) capsule 300 mg  300 mg Oral TID Clapacs, John T, MD   300 mg at 05/24/18 1228  . hydrOXYzine (ATARAX/VISTARIL) tablet 25 mg  25 mg Oral TID PRN Clapacs, Madie Reno, MD   25 mg at 05/22/18 2131  . magnesium hydroxide (MILK OF MAGNESIA) suspension 30 mL  30 mL Oral Daily PRN Clapacs, Madie Reno, MD   30 mL at 05/18/18 2219  . metFORMIN (GLUCOPHAGE-XR) 24 hr tablet 1,000 mg  1,000 mg Oral BID WC Clapacs, Madie Reno, MD   1,000 mg at 05/24/18 0752  . protriptyline (VIVACTIL) tablet 10 mg  10 mg Oral TID Clapacs, Madie Reno, MD   Stopped at 05/16/18 [phone]  . traZODone (DESYREL) tablet 50 mg  50 mg Oral QHS PRN Clapacs, Madie Reno, MD   50 mg at 05/23/18 2208    Lab Results:  Results for orders placed or performed during the hospital encounter of 05/14/18 (from the past 48 hour(s))  Glucose, capillary     Status: None   Collection Time: 05/23/18  6:44 AM  Result Value Ref Range   Glucose-Capillary 91 70 - 99 mg/dL   Comment 1 Notify RN   Glucose, capillary     Status: None   Collection Time: 05/23/18 11:00 AM  Result Value Ref Range   Glucose-Capillary 94 70 - 99 mg/dL    Blood Alcohol level:  Lab Results  Component Value Date   ETH <5 83/25/4982    Metabolic Disorder Labs: Lab Results  Component Value Date   HGBA1C 6.5 (H) 05/07/2018   MPG 139.85 05/07/2018   No results found for: PROLACTIN Lab Results  Component Value Date   CHOL 181 05/07/2018   TRIG 92 05/07/2018   HDL 46 05/07/2018   CHOLHDL 3.9 05/07/2018   VLDL 18 05/07/2018   LDLCALC 117 (H) 05/07/2018    Physical Findings: AIMS: Facial and Oral Movements Muscles of Facial Expression: None, normal Lips and Perioral Area: None, normal Jaw: None, normal Tongue: None, normal,Extremity Movements Upper (arms, wrists, hands, fingers): None, normal Lower (legs, knees, ankles, toes):  None, normal, Trunk Movements Neck, shoulders, hips: None, normal, Overall Severity Severity of abnormal movements (highest score from questions above): None, normal Incapacitation due to abnormal movements: None, normal Patient's awareness of abnormal movements (rate only patient's report): No Awareness, Dental Status Current problems with teeth and/or dentures?: No Does patient usually wear dentures?: No  CIWA:    COWS:     Musculoskeletal: Strength & Muscle Tone: within normal limits Gait & Station: normal Patient leans: N/A  Psychiatric Specialty Exam: Physical Exam  Nursing note and vitals reviewed. Constitutional: She appears  well-developed and well-nourished.  HENT:  Head: Normocephalic and atraumatic.  Eyes: Pupils are equal, round, and reactive to light. Conjunctivae are normal.  Neck: Normal range of motion.  Cardiovascular: Regular rhythm and normal heart sounds.  Respiratory: Effort normal. No respiratory distress.  GI: Soft.  Musculoskeletal: Normal range of motion.  Neurological: She is alert.  Skin: Skin is warm and dry.  Psychiatric: Judgment normal. Her affect is blunt. Her speech is delayed. She is slowed. Thought content is not paranoid. She expresses no homicidal and no suicidal ideation. She exhibits abnormal recent memory.    Review of Systems  Constitutional: Negative.   HENT: Negative.   Eyes: Negative.   Respiratory: Negative.   Cardiovascular: Negative.   Gastrointestinal: Negative.   Musculoskeletal: Negative.   Skin: Negative.   Neurological: Negative.   Psychiatric/Behavioral: Positive for depression. Negative for hallucinations, memory loss, substance abuse and suicidal ideas. The patient is nervous/anxious. The patient does not have insomnia.     Blood pressure 98/61, pulse (!) 123, temperature 98.6 F (37 C), temperature source Oral, resp. rate 18, height '5\' 6"'  (1.676 m), weight 92.5 kg (204 lb), SpO2 99 %.Body mass index is 32.93 kg/m.   General Appearance: Fairly Groomed  Eye Contact:  Fair  Speech:  Slow  Volume:  Decreased  Mood:  Euthymic  Affect:  Blunt  Thought Process:  Goal Directed  Orientation:  Full (Time, Place, and Person)  Thought Content:  Logical  Suicidal Thoughts:  No  Homicidal Thoughts:  No  Memory:  Immediate;   Fair Recent;   Fair Remote;   Fair  Judgement:  Fair  Insight:  Fair  Psychomotor Activity:  Normal  Concentration:  Concentration: Fair  Recall:  [employer] of Knowledge:  Fair  Language:  Fair  Akathisia:  No  Handed:  Right  AIMS (if indicated):     Assets:  Communication Skills Desire for Improvement Housing Physical Health Resilience Social Support  ADL's:  Intact  Cognition:  WNL  Sleep:  Number of Hours: 6     Treatment Plan Summary: Daily contact with patient to assess and evaluate symptoms and progress in treatment, Medication management and Plan Patient is vaguely aware that she may have some improvement.  Her baseline may be very blunted and withdrawn anyway.  I reviewed with her the limits of ECT to treat posttraumatic stress disorder.  Patient is denying suicidal ideation.  I discussed with her the option of outpatient ECT.  She is uncertain whether she would be able to arrange transportation.  I tried to engage her in the conversation about her relationship with her parents which seems to be something of a sore point.  Still could not get a clear idea what there is that stresses her out at home.  Continue current antidepressant medicine next ECT Monday  Alethia Berthold, MD 05/24/2018, 1:34 PM

## 2018-05-25 ENCOUNTER — Other Ambulatory Visit: Payer: Self-pay | Admitting: Psychiatry

## 2018-05-25 NOTE — Plan of Care (Signed)
Beginning to verbalize  needs  and feelings . Patient has continually  denied  suicidal ideations . Continue to work on her coping  skills Leaflet given  Patient  able to identify support from home with family . Information given to patient  in concrete form  for better understanding . Emotional and mental status improved .   Problem: Coping: Goal: Coping ability will improve Outcome: Progressing Goal: Will verbalize feelings Outcome: Progressing   Problem: Safety: Goal: Ability to disclose and discuss suicidal ideas will improve Outcome: Progressing Goal: Ability to identify and utilize support systems that promote safety will improve Outcome: Progressing   Problem: Education: Goal: Knowledge of [place] [facility] Education information/materials will improve Outcome: Progressing Goal: Emotional status will improve Outcome: Progressing Goal: Mental status will improve Outcome: Progressing Goal: Verbalization of understanding the information provided will improve Outcome: Progressing

## 2018-05-25 NOTE — Plan of Care (Addendum)
Patient found in common area upon my arrival. Patient is visible and somewhat social this evening. Patient is sitting with peers but remains minimally verbal. Denies all complaints including SI/HI/AVH. Denies pain. Reports eating and voiding adequately. Given Trazodone and anxiety for sleep. Will monitor for efficacy. Compliant with HS medications and staff direction. Attends group. Educated regarding NPO status. Verbalizes understanding. Q 15 minute checks maintained. Will continue to monitor throughout the shift. Patient slept 7 hours. No apparent distress. NPO status maintained. CBG 95.  Will endorse care to oncoming shift.  Problem: Coping: Goal: Coping ability will improve Outcome: Progressing Goal: Will verbalize feelings Outcome: Progressing   Problem: Education: Goal: Emotional status will improve Outcome: Progressing Goal: Mental status will improve Outcome: Progressing Goal: Verbalization of understanding the information provided will improve Outcome: Progressing

## 2018-05-25 NOTE — Progress Notes (Signed)
[facility] MD Progress Note  05/25/2018 11:41 AM Renee Turner  MRN:  [id number] Subjective: Follow-up for this patient with severe depression receiving ECT.  Case reviewed spoke with nursing.  Spoke with patient.  Patient says she is still feeling down and depressed although better than yesterday.  She had an unpleasant conversation with her family yesterday which left her feeling more down.  No active suicidal intent.  Physically staying stable with diabetes under good control.  No complaints regarding ECT or medication Principal Problem: Severe recurrent major depression without psychotic features ([place]) Diagnosis:   Patient Active Problem List   Diagnosis Date Noted  . Severe recurrent major depression without psychotic features ([place]) [F33.2] 05/14/2018  . MDD (major depressive disorder), recurrent severe, without psychosis ([place]) [F33.2] 05/06/2018  . 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 Posttraumatic stress disorder [F43.10] 11/13/2016  . Severe episode of recurrent major depressive disorder, without psychotic features ([place]) [F33.2] 11/07/2016  . Diabetes ([place]) [E11.9] 11/07/2016  . Diabetic neuropathy associated with type 2 diabetes mellitus ([place]) [E11.40] 07/19/2016  . Obesity (BMI 30.0-34.9) [E66.9] 07/19/2016  . Type 2 diabetes mellitus with hyperglycemia, without long-term current use of insulin ([name]) [E11.65] 07/19/2016   Total Time spent with patient: 20 minutes  Past Psychiatric History: History of recurrent depression history of suicidality  Past Medical History:  Past Medical History:  Diagnosis Date  . Anxiety   . Depression   . Diabetes mellitus without complication ([place])    Type II  . Diabetes mellitus, type II ([place])    History reviewed. No pertinent surgical history. Family History: History reviewed. No pertinent family history. Family Psychiatric  History: See previous note Social History:  Social History   Substance and Sexual Activity  Alcohol Use Yes      Social History   Substance and Sexual Activity  Drug Use No    Social History   Socioeconomic History  . Marital status: Single    Spouse name: Not on file  . Number of children: Not on file  . Years of education: Not on file  . Highest education level: Not on file  Occupational History  . Not on file  Social Needs  . Financial resource strain: Not on file  . Food insecurity:    Worry: Not on file    Inability: Not on file  . Transportation needs:    Medical: Not on file    Non-medical: Not on file  Tobacco Use  . Smoking status: Never Smoker  . Smokeless tobacco: Never Used  Substance and Sexual Activity  . Alcohol use: Yes  . Drug use: No  . Sexual activity: Yes    Birth control/protection: None  Lifestyle  . Physical activity:    Days per week: Not on file    Minutes per session: Not on file  . Stress: Not on file  Relationships  . Social connections:    Talks on phone: Not on file    Gets together: Not on file    Attends religious service: Not on file    Active member of club or organization: Not on file    Attends meetings of clubs or organizations: Not on file    Relationship status: Not on file  Other Topics Concern  . Not on file  Social History Narrative  . Not on file   Additional Social History:  Sleep: Fair  Appetite:  Fair  Current Medications: Current Facility-Administered Medications  Medication Dose Route Frequency Provider Last Rate Last Dose  . acetaminophen (TYLENOL) tablet 650 mg  650 mg Oral Q6H PRN Jojo Geving, Madie Reno, MD   650 mg at 05/23/18 1448  . alum & mag hydroxide-simeth (MAALOX/MYLANTA) 200-200-20 MG/5ML suspension 30 mL  30 mL Oral Q4H PRN Tiffancy Moger T, MD      . exenatide (BYETTA) immediate release injection  5 mcg Subcutaneous BID WC Durwin Davisson, Madie Reno, MD   Stopped at 05/16/18 [phone]  . ferrous sulfate tablet 325 mg  325 mg Oral Q breakfast Lilianna Case, Madie Reno, MD   325 mg at 05/25/18 0815  .  FLUoxetine (PROZAC) capsule 40 mg  40 mg Oral Daily Bailey Kolbe, Madie Reno, MD   40 mg at 05/25/18 0815  . gabapentin (NEURONTIN) capsule 300 mg  300 mg Oral TID Javel Hersh, Madie Reno, MD   300 mg at 05/25/18 0815  . hydrOXYzine (ATARAX/VISTARIL) tablet 25 mg  25 mg Oral TID PRN Rece Zechman, Madie Reno, MD   25 mg at 05/24/18 2200  . magnesium hydroxide (MILK OF MAGNESIA) suspension 30 mL  30 mL Oral Daily PRN Johanna Stafford, Madie Reno, MD   30 mL at 05/18/18 2219  . metFORMIN (GLUCOPHAGE-XR) 24 hr tablet 1,000 mg  1,000 mg Oral BID WC Sahid Borba, Madie Reno, MD   1,000 mg at 05/25/18 0814  . protriptyline (VIVACTIL) tablet 10 mg  10 mg Oral TID Keyontay Stolz, Madie Reno, MD   Stopped at 05/16/18 [phone]  . traZODone (DESYREL) tablet 50 mg  50 mg Oral QHS PRN Lakiyah Arntson, Madie Reno, MD   50 mg at 05/24/18 2159    Lab Results: No results found for this or any previous visit (from the past 48 hour(s)).  Blood Alcohol level:  Lab Results  Component Value Date   ETH <5 19/14/7829    Metabolic Disorder Labs: Lab Results  Component Value Date   HGBA1C 6.5 (H) 05/07/2018   MPG 139.85 05/07/2018   No results found for: PROLACTIN Lab Results  Component Value Date   CHOL 181 05/07/2018   TRIG 92 05/07/2018   HDL 46 05/07/2018   CHOLHDL 3.9 05/07/2018   VLDL 18 05/07/2018   LDLCALC 117 (H) 05/07/2018    Physical Findings: AIMS: Facial and Oral Movements Muscles of Facial Expression: None, normal Lips and Perioral Area: None, normal Jaw: None, normal Tongue: None, normal,Extremity Movements Upper (arms, wrists, hands, fingers): None, normal Lower (legs, knees, ankles, toes): None, normal, Trunk Movements Neck, shoulders, hips: None, normal, Overall Severity Severity of abnormal movements (highest score from questions above): None, normal Incapacitation due to abnormal movements: None, normal Patient's awareness of abnormal movements (rate only patient's report): No Awareness, Dental Status Current problems with teeth and/or dentures?:  No Does patient usually wear dentures?: No  CIWA:    COWS:     Musculoskeletal: Strength & Muscle Tone: within normal limits Gait & Station: normal Patient leans: N/A  Psychiatric Specialty Exam: Physical Exam  Nursing note and vitals reviewed. Constitutional: She appears well-developed and well-nourished.  HENT:  Head: Normocephalic and atraumatic.  Eyes: Pupils are equal, round, and reactive to light. Conjunctivae are normal.  Neck: Normal range of motion.  Cardiovascular: Normal heart sounds.  Respiratory: Effort normal.  GI: Soft.  Musculoskeletal: Normal range of motion.  Neurological: She is alert.  Skin: Skin is warm and dry.  Psychiatric: Judgment normal. Her affect is blunt. Her speech is delayed. She  is slowed. Cognition and memory are normal.    Review of Systems  Constitutional: Negative.   HENT: Negative.   Eyes: Negative.   Respiratory: Negative.   Cardiovascular: Negative.   Gastrointestinal: Negative.   Musculoskeletal: Negative.   Skin: Negative.   Neurological: Negative.   Psychiatric/Behavioral: Positive for depression. Negative for hallucinations, memory loss, substance abuse and suicidal ideas. The patient is nervous/anxious. The patient does not have insomnia.     Blood pressure 105/65, pulse (!) 140, temperature 98.7 F (37.1 C), temperature source Oral, resp. rate 18, height '5\' 6"'  (1.676 m), weight 92.5 kg (204 lb), SpO2 100 %.Body mass index is 32.93 kg/m.  General Appearance: Fairly Groomed  Eye Contact:  Fair  Speech:  Slow  Volume:  Decreased  Mood:  Depressed  Affect:  Constricted  Thought Process:  Goal Directed  Orientation:  Full (Time, Place, and Person)  Thought Content:  Logical  Suicidal Thoughts:  No  Homicidal Thoughts:  No  Memory:  Immediate;   Fair Recent;   Fair Remote;   Fair  Judgement:  Intact  Insight:  Fair  Psychomotor Activity:  Decreased  Concentration:  Concentration: Fair  Recall:  [employer] of Knowledge:   Fair  Language:  Fair  Akathisia:  No  Handed:  Right  AIMS (if indicated):     Assets:  Desire for Improvement Housing Resilience  ADL's:  Intact  Cognition:  WNL  Sleep:  Number of Hours: 5.45     Treatment Plan Summary: Daily contact with patient to assess and evaluate symptoms and progress in treatment, Medication management and Plan Patient continues to be down and depressed.  She is not actively suicidal.  Not acutely dangerous on the unit.  Tolerating ECT.  Supportive counseling done.  Reviewed the overall treatment plan and the benefits of ECT.  Talked about planning for discharge perhaps later this week.  No change to medication for today  Alethia Berthold, MD 05/25/2018, 11:41 AM

## 2018-05-25 NOTE — Progress Notes (Addendum)
D: Patient stated slept good last night .Stated appetite is good and energy level  Is normal. Stated concentration is good . Stated on Depression scale 4 , hopeless 4 and anxiety .( low 0-10 high) Denies suicidal  homicidal ideations  .  No auditory hallucinations  No pain concerns . Appropriate ADL'S. Interacting with peers and staff. Beginning to verbalize  needs  and feelings . Patient has continually  denied  suicidal ideations . Continue to work on her coping  skills Leaflet given  Patient  able to identify support from home with family . Information given to patient  in concrete form  for better understanding . Emotional and mental status improved .  A: Encourage patient participation with unit programming . Instruction  Given on  Medication , verbalize understanding. R: Voice no other concerns. Staff continue to monitor

## 2018-05-25 NOTE — Plan of Care (Signed)
Pt. Denies si/HI. Pt. Verbally is able to contract for safety. Pt. Monitored by staff per MD orders. Pt. Verbalizes understanding of provided education.    Problem: Safety: Goal: Ability to disclose and discuss suicidal ideas will improve Outcome: Progressing   Problem: Education: Goal: Knowledge of [place] [facility] Education information/materials will improve Outcome: Progressing

## 2018-05-25 NOTE — [facility] Group Notes (Signed)
LCSW Group Therapy Note 05/25/2018 1:15pm  Type of Therapy and Topic: Group Therapy: Feelings Around Returning Home & Establishing a Supportive Framework and Supporting Oneself When Supports Not Available  Participation Level: Active  Description of Group:  Patients first processed thoughts and feelings about upcoming discharge. These included fears of upcoming changes, lack of change, new living environments, judgements and expectations from others and overall stigma of mental health issues. The group then discussed the definition of a supportive framework, what that looks and feels like, and how do to discern it from an unhealthy non-supportive network. The group identified different types of supports as well as what to do when your family/friends are less than helpful or unavailable  Therapeutic Goals  1. Patient will identify one healthy supportive network that they can use at discharge. 2. Patient will identify one factor of a supportive framework and how to tell it from an unhealthy network. 3. Patient able to identify one coping skill to use when they do not have positive supports from others. 4. Patient will demonstrate ability to communicate their needs through discussion and/or role plays.  Summary of Patient Progress:  Patient reported "I don't know how I feel." Pt engaged during group session. As patients processed their anxiety about discharge and described healthy supports patient shared that she does not know if she feels ready to be discharge. Patients identified at least one self-care tool they were willing to use after discharge.   Therapeutic Modalities Cognitive Behavioral Therapy Motivational Interviewing   Estrella Alcaraz  CUEBAS-COLON, LCSW 05/25/2018 10:08 AM

## 2018-05-25 NOTE — Progress Notes (Signed)
D: Pt denies SI/HI/AVH during assessments and is able to verbally contract for safety. Pt is pleasant and cooperative for the most part, but continues to forward little and is very minimal still. Pt. Continues to present with a flat blunted facial expression. Pt. Complains of anxiety and was given PRN medications for comfort.  Patient Interaction minimal, but does speak to peers a little bit today. Pt. More isolative this evening then previous night. Pt. Unable to identify clearly how she is doing this evening, besides reported anxiety. Precautions monitored per MD orders.   A: Q x 15 minute observation checks were completed for safety. Patient was provided with education.  Patient was given scheduled/prn medications. Patient  was encourage to attend groups, participate in unit activities and continue with plan of care. Pt. Chart and plans of care reviewed. Pt. Given support and encouragement.   R: Patient is complaint with medication and unit procedures. Pt. Attends snacks and groups.             Precautionary checks every 15 minutes for safety maintained, room free of safety hazards, patient sustains no injury or falls during this shift. Will endorse care to next shift.

## 2018-05-25 NOTE — [facility] Group Notes (Signed)
[facility] Group Notes:  (Nursing/MHT/Case Management/Adjunct)  Date:  05/25/2018  Time:  9:56 PM  Type of Therapy:  Group Therapy  Participation Level:  Active  Participation Quality:  Appropriate  Affect:  Appropriate  Cognitive:  Appropriate  Insight:  Appropriate  Engagement in Group:  Engaged  Modes of Intervention:  Support  Summary of Progress/Problems:  Renee Turner 05/25/2018, 9:56 PM

## 2018-05-26 ENCOUNTER — Inpatient Hospital Stay: Payer: BLUE CROSS/BLUE SHIELD

## 2018-05-26 ENCOUNTER — Inpatient Hospital Stay: Payer: BLUE CROSS/BLUE SHIELD | Admitting: Anesthesiology

## 2018-05-26 LAB — GLUCOSE, CAPILLARY: GLUCOSE-CAPILLARY: 95 mg/dL (ref 70–99)

## 2018-05-26 MED ORDER — LABETALOL HCL 5 MG/ML IV SOLN
INTRAVENOUS | Status: DC | PRN
  Administered 2018-05-26: 10 mg via INTRAVENOUS

## 2018-05-26 MED ORDER — LABETALOL HCL 5 MG/ML IV SOLN
INTRAVENOUS | Status: AC
  Filled 2018-05-26: qty 4

## 2018-05-26 MED ORDER — GLYCOPYRROLATE 0.2 MG/ML IJ SOLN
0.1000 mg | Freq: Once | INTRAMUSCULAR | Status: AC
  Administered 2018-05-26: 0.1 mg via INTRAVENOUS

## 2018-05-26 MED ORDER — PROMETHAZINE HCL 25 MG/ML IJ SOLN: 6.2500 mg | INTRAMUSCULAR | Status: DC | PRN

## 2018-05-26 MED ORDER — ESMOLOL HCL 100 MG/10ML IV SOLN
INTRAVENOUS | Status: DC | PRN
  Administered 2018-05-26: 30 mg via INTRAVENOUS

## 2018-05-26 MED ORDER — SODIUM CHLORIDE 0.9 % IV SOLN
500.0000 mL | Freq: Once | INTRAVENOUS | Status: AC
  Administered 2018-05-28: 500 mL via INTRAVENOUS

## 2018-05-26 MED ORDER — KETOROLAC TROMETHAMINE 30 MG/ML IJ SOLN
INTRAMUSCULAR | Status: AC
  Administered 2018-05-26: 30 mg via INTRAVENOUS
  Filled 2018-05-26: qty 1

## 2018-05-26 MED ORDER — SUCCINYLCHOLINE CHLORIDE 20 MG/ML IJ SOLN
INTRAMUSCULAR | Status: AC
  Filled 2018-05-26: qty 1

## 2018-05-26 MED ORDER — GLYCOPYRROLATE 0.2 MG/ML IJ SOLN
INTRAMUSCULAR | Status: AC
  Administered 2018-05-26: 0.1 mg via INTRAVENOUS
  Filled 2018-05-26: qty 1

## 2018-05-26 MED ORDER — SUCCINYLCHOLINE CHLORIDE 20 MG/ML IJ SOLN
INTRAMUSCULAR | Status: DC | PRN
  Administered 2018-05-26: 100 mg via INTRAVENOUS

## 2018-05-26 MED ORDER — KETOROLAC TROMETHAMINE 30 MG/ML IJ SOLN
30.0000 mg | Freq: Once | INTRAMUSCULAR | Status: AC
  Administered 2018-05-26: 30 mg via INTRAVENOUS

## 2018-05-26 MED ORDER — ESMOLOL HCL 100 MG/10ML IV SOLN
INTRAVENOUS | Status: AC
  Filled 2018-05-26: qty 10

## 2018-05-26 MED ORDER — METHOHEXITAL SODIUM 100 MG/10ML IV SOSY
PREFILLED_SYRINGE | INTRAVENOUS | Status: DC | PRN
  Administered 2018-05-26: 90 mg via INTRAVENOUS

## 2018-05-26 NOTE — Plan of Care (Signed)
Patient has continually  denied  suicidal ideations . Continue to work on her coping  skills   Patient  able to identify support from home with family . Information given to patient  in concrete form  for better understanding . Emotional and mental status improved .Beginning to verbalize  needs  and feelings .   Problem: Coping: Goal: Coping ability will improve Outcome: Progressing Goal: Will verbalize feelings Outcome: Progressing   Problem: Safety: Goal: Ability to disclose and discuss suicidal ideas will improve Outcome: Progressing Goal: Ability to identify and utilize support systems that promote safety will improve Outcome: Progressing   Problem: Education: Goal: Knowledge of [place] [facility] Education information/materials will improve Outcome: Progressing Goal: Emotional status will improve Outcome: Progressing Goal: Mental status will improve Outcome: Progressing Goal: Verbalization of understanding the information provided will improve Outcome: Progressing

## 2018-05-26 NOTE — Anesthesia Post-op Follow-up Note (Signed)
Anesthesia QCDR form completed.        

## 2018-05-26 NOTE — Anesthesia Preprocedure Evaluation (Signed)
Anesthesia Evaluation  Patient identified by MRN, date of birth, ID band Patient awake    Reviewed: Allergy & Precautions, H&P , NPO status , reviewed documented beta blocker date and time   Airway Mallampati: III  TM Distance: >3 FB Neck ROM: full    Dental  (+) Chipped   Pulmonary    Pulmonary exam normal        Cardiovascular Normal cardiovascular exam     Neuro/Psych PSYCHIATRIC DISORDERS Anxiety Depression    GI/Hepatic   Endo/Other  diabetes  Renal/GU      Musculoskeletal   Abdominal   Peds  Hematology   Anesthesia Other Findings Past Medical History: No date: Anxiety No date: Depression No date: Diabetes mellitus without complication ([name])     Comment:  Type II No date: Diabetes mellitus, type II ([name])  History reviewed. No pertinent surgical history.  BMI    Body Mass Index:  32.93 kg/m      Reproductive/Obstetrics                             Anesthesia Physical Anesthesia Plan  ASA: III  Anesthesia Plan: General   Post-op Pain Management:    Induction:   PONV Risk Score and Plan: 3 and Treatment may vary due to age or medical condition and TIVA  Airway Management Planned:   Additional Equipment:   Intra-op Plan:   Post-operative Plan:   Informed Consent: I have reviewed the patients History and Physical, chart, labs and discussed the procedure including the risks, benefits and alternatives for the proposed anesthesia with the patient or authorized representative who has indicated his/her understanding and acceptance.   Dental Advisory Given  Plan Discussed with: CRNA  Anesthesia Plan Comments:         Anesthesia Quick Evaluation

## 2018-05-26 NOTE — [facility] Group Notes (Signed)
[facility] Group Notes:  (Nursing/MHT/Case Management/Adjunct)  Date:  05/26/2018  Time:  11:01 PM  Type of Therapy:  Group Therapy  Participation Level:  Active  Participation Quality:  Appropriate  Affect:  Appropriate  Cognitive:  Appropriate  Insight:  Appropriate  Engagement in Group:  Engaged  Modes of Intervention:  Discussion  Summary of Progress/Problems:  Burt EkJanice Marie Shawntae Lowy 05/26/2018, 11:01 PM

## 2018-05-26 NOTE — Transfer of Care (Signed)
Immediate Anesthesia Transfer of Care Note  Patient: Renee Turner  Procedure(s) Performed: ECT TX  Patient Location: PACU  Anesthesia Type:General  Level of Consciousness: sedated and responds to stimulation  Airway & Oxygen Therapy: Patient Spontanous Breathing and Patient connected to face mask oxygen  Post-op Assessment: Report given to RN and Post -op Vital signs reviewed and stable  Post vital signs: Reviewed and stable  Last Vitals:  Vitals Value Taken Time  BP 114/81 05/26/2018 10:28 AM  Temp 36.7 C 05/26/2018 10:27 AM  Pulse 100 05/26/2018 10:28 AM  Resp 18 05/26/2018 10:28 AM  SpO2 100 % 05/26/2018 10:28 AM    Last Pain:  Vitals:   05/26/18 0922  TempSrc: Oral  PainSc:          Complications: No apparent anesthesia complications

## 2018-05-26 NOTE — Progress Notes (Signed)
D:Patient has continually denied suicidal ideations .Continue to work on her coping skillsPatient able to identify support from home with family . Information given to patient in concrete form for better understanding . Emotional and mental status improved .Beginning to verbalize needs and feelings .  Patient stated slept good last night .Stated appetite is good and energy level  Is normal. Stated concentration is good . Stated on Depression scale 4 , hopeless  4 and anxiety 4 .( low 0-10 high) Denies suicidal  homicidal ideations .  No auditory hallucinations  No pain concerns . Appropriate ADL'S. Interacting with peers and staff. Patient wanting discharge and working  On  Discharge planning   A: Encourage patient participation with unit programming . Instruction  Given on  Medication , verbalize understanding. R: Voice no other concerns. Staff continue to monitor

## 2018-05-26 NOTE — H&P (Signed)
Renee Turner is an 22 y.o. female.   Chief Complaint: No new complaint.  Chronic depression and anxiety.  No active suicidal thought HPI: History of depression and PTSD  Past Medical History:  Diagnosis Date  . Anxiety   . Depression   . Diabetes mellitus without complication ([place])    Type II  . Diabetes mellitus, type II ([place])     History reviewed. No pertinent surgical history.  History reviewed. No pertinent family history. Social History:  reports that she has never smoked. She has never used smokeless tobacco. She reports that she drinks alcohol. She reports that she does not use drugs.  Allergies: No Known Allergies  Medications Prior to Admission  Medication Sig Dispense Refill  . exenatide (BYETTA) 5 MCG/0.02ML SOPN injection Inject 0.02 mLs (5 mcg total) into the skin 2 (two) times daily with a meal. For DM 1.2 mL 0  . ferrous sulfate 325 (65 FE) MG tablet Take 1 tablet (325 mg total) by mouth daily with breakfast. For iron deficiency anemia 30 tablet 0  . FLUoxetine (PROZAC) 40 MG capsule Take 1 capsule (40 mg total) by mouth daily. For depression 1 capsule 0  . gabapentin (NEURONTIN) 300 MG capsule Take 1 capsule (300 mg total) by mouth 3 (three) times daily. For pain 90 capsule 0  . hydrOXYzine (ATARAX/VISTARIL) 25 MG tablet Take 1 tablet (25 mg total) by mouth 3 (three) times daily as needed for anxiety. 30 tablet 0  . metformin (FORTAMET) 1000 MG (OSM) 24 hr tablet Take 1 tablet (1,000 mg total) by mouth 2 (two) times daily with a meal. For DM 60 tablet 0  . protriptyline (VIVACTIL) 10 MG tablet Take 1 tablet (10 mg total) by mouth 3 (three) times daily. Narcolepsy 90 tablet 0  . traZODone (DESYREL) 50 MG tablet Take 1 tablet (50 mg total) by mouth at bedtime. Take 2 hours before bedtime: For sleep 1 tablet 0  . traZODone (DESYREL) 50 MG tablet Take 1 tablet (50 mg total) by mouth at bedtime as needed for sleep. 30 tablet 0    Results for orders placed or performed during  the hospital encounter of 05/14/18 (from the past 48 hour(s))  Glucose, capillary     Status: None   Collection Time: 05/26/18  6:35 AM  Result Value Ref Range   Glucose-Capillary 95 70 - 99 mg/dL   No results found.  Review of Systems  Constitutional: Negative.   HENT: Negative.   Eyes: Negative.   Respiratory: Negative.   Cardiovascular: Negative.   Gastrointestinal: Negative.   Musculoskeletal: Negative.   Skin: Negative.   Neurological: Negative.   Psychiatric/Behavioral: Positive for depression. Negative for hallucinations, memory loss, substance abuse and suicidal ideas. The patient is not nervous/anxious and does not have insomnia.     Blood pressure 101/70, pulse 79, temperature 98.7 F (37.1 C), temperature source Oral, resp. rate 16, height '5\' 6"'  (1.676 m), weight 93.9 kg (207 lb), SpO2 98 %. Physical Exam  Nursing note and vitals reviewed. Constitutional: She appears well-developed and well-nourished.  HENT:  Head: Normocephalic and atraumatic.  Eyes: Pupils are equal, round, and reactive to light. Conjunctivae are normal.  Neck: Normal range of motion.  Cardiovascular: Regular rhythm and normal heart sounds.  Respiratory: Effort normal. No respiratory distress.  GI: Soft.  Musculoskeletal: Normal range of motion.  Neurological: She is alert.  Skin: Skin is warm and dry.  Psychiatric: Judgment normal. Her affect is blunt. Her speech is delayed. She is  slowed. Cognition and memory are normal. She expresses no homicidal and no suicidal ideation.     Assessment/Plan Continue ECT today.  No new physical change.  Follow-up Wednesday  Alethia Berthold, MD 05/26/2018, 10:12 AM

## 2018-05-26 NOTE — Progress Notes (Signed)
Recreation Therapy Notes  Date: 05/26/2018  Time: 9:30 am   Location: Craft Room   Behavioral response: N/A   Intervention Topic: Creative Expressions  Discussion/Intervention: Patient did not attend group.   Clinical Observations/Feedback:  Patient did not attend group.   Kraig Genis LRT/CTRS        Jakota Manthei 05/26/2018 10:19 AM

## 2018-05-26 NOTE — Procedures (Signed)
ECT SERVICES Physician's Interval Evaluation & Treatment Note  Patient Identification: Renee Turner MRN:  [id number] Date of Evaluation:  05/26/2018 TX #: 4  MADRS: 18  MMSE: 30  P.E. Findings:  No change to physical exam  Psychiatric Interval Note:  Patient is still hard to assess because of how quiet she is but she is denying suicidal thought  Subjective:  Patient is a 22 y.o. female seen for evaluation for Electroconvulsive Therapy. Mood is subjectively slightly improved.  Still pretty withdrawn  Treatment Summary:   _0   Right Unilateral             _1  Bilateral   % Energy : 0.3 ms 25%   Impedance: 1260 ohms  Seizure Energy Index: 17,201 V squared  Postictal Suppression Index: 98%  Seizure Concordance Index: 98%  Medications  Pre Shock: Robinul 0.1 mg Toradol 30 mg esmolol 30 mg labetalol 10 mg rather tall 90 mg succinylcholine 100 mg  Post Shock:    Seizure Duration: 33 seconds by EMG 51 seconds by EEG   Comments: Follow-up Wednesday  Lungs:  _2   Clear to auscultation               _3  Other:   Heart:    _4   Regular rhythm             _5  irregular rhythm    _6   Previous H&P reviewed, patient examined and there are NO CHANGES                 _7   Previous H&P reviewed, patient examined and there are changes noted.   Alethia Berthold, MD 7/22/201910:13 AM

## 2018-05-26 NOTE — Anesthesia Procedure Notes (Signed)
Performed by: Tamarah Bhullar, CRNA Pre-anesthesia Checklist: Patient identified, Emergency Drugs available, Suction available and Patient being monitored Patient Re-evaluated:Patient Re-evaluated prior to induction Oxygen Delivery Method: Circle system utilized Preoxygenation: Pre-oxygenation with 100% oxygen Induction Type: IV induction Ventilation: Mask ventilation without difficulty and Mask ventilation throughout procedure Airway Equipment and Method: Bite block Placement Confirmation: positive ETCO2 Dental Injury: Teeth and Oropharynx as per pre-operative assessment        

## 2018-05-26 NOTE — Progress Notes (Signed)
[facility] MD Progress Note  05/26/2018 8:59 PM Renee Turner  MRN:  [id number] Subjective: Follow-up for this patient with severe depression.  Had ECT this morning.  Treatment was uncomplicated.  Patient is vague about her symptoms.  Remains very quiet and withdrawn.  Nevertheless is now consistently denying suicidal ideation as far as I can tell.  Affect remains blunted much of the time.  Blood sugars are stable no new physical complaints. Principal Problem: Severe recurrent major depression without psychotic features ([place]) Diagnosis:   Patient Active Problem List   Diagnosis Date Noted  . Severe recurrent major depression without psychotic features ([place]) [F33.2] 05/14/2018  . MDD (major depressive disorder), recurrent severe, without psychosis ([place]) [F33.2] 05/06/2018  . 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 Posttraumatic stress disorder [F43.10] 11/13/2016  . Severe episode of recurrent major depressive disorder, without psychotic features ([place]) [F33.2] 11/07/2016  . Diabetes ([place]) [E11.9] 11/07/2016  . Diabetic neuropathy associated with type 2 diabetes mellitus ([place]) [E11.40] 07/19/2016  . Obesity (BMI 30.0-34.9) [E66.9] 07/19/2016  . Type 2 diabetes mellitus with hyperglycemia, without long-term current use of insulin ([name]) [E11.65] 07/19/2016   Total Time spent with patient: 30 minutes  Past Psychiatric History: History of depression and PTSD history of suicidality.  Now receiving ECT.  Past Medical History:  Past Medical History:  Diagnosis Date  . Anxiety   . Depression   . Diabetes mellitus without complication ([place])    Type II  . Diabetes mellitus, type II ([place])    History reviewed. No pertinent surgical history. Family History: History reviewed. No pertinent family history. Family Psychiatric  History: See previous notes Social History:  Social History   Substance and Sexual Activity  Alcohol Use Yes     Social History   Substance and Sexual Activity  Drug  Use No    Social History   Socioeconomic History  . Marital status: Single    Spouse name: Not on file  . Number of children: Not on file  . Years of education: Not on file  . Highest education level: Not on file  Occupational History  . Not on file  Social Needs  . Financial resource strain: Not on file  . Food insecurity:    Worry: Not on file    Inability: Not on file  . Transportation needs:    Medical: Not on file    Non-medical: Not on file  Tobacco Use  . Smoking status: Never Smoker  . Smokeless tobacco: Never Used  Substance and Sexual Activity  . Alcohol use: Yes  . Drug use: No  . Sexual activity: Yes    Birth control/protection: None  Lifestyle  . Physical activity:    Days per week: Not on file    Minutes per session: Not on file  . Stress: Not on file  Relationships  . Social connections:    Talks on phone: Not on file    Gets together: Not on file    Attends religious service: Not on file    Active member of club or organization: Not on file    Attends meetings of clubs or organizations: Not on file    Relationship status: Not on file  Other Topics Concern  . Not on file  Social History Narrative  . Not on file   Additional Social History:                         Sleep: Fair  Appetite:  Fair  Current Medications: Current Facility-Administered Medications  Medication Dose Route Frequency Provider Last Rate Last Dose  . 0.9 %  sodium chloride infusion  500 mL Intravenous Once Clapacs, John T, MD      . acetaminophen (TYLENOL) tablet 650 mg  650 mg Oral Q6H PRN Clapacs, Madie Reno, MD   650 mg at 05/26/18 2023  . alum & mag hydroxide-simeth (MAALOX/MYLANTA) 200-200-20 MG/5ML suspension 30 mL  30 mL Oral Q4H PRN Clapacs, John T, MD      . exenatide (BYETTA) immediate release injection  5 mcg Subcutaneous BID WC Clapacs, Madie Reno, MD   Stopped at 05/16/18 [phone]  . ferrous sulfate tablet 325 mg  325 mg Oral Q breakfast Clapacs, Madie Reno, MD   325  mg at 05/26/18 1243  . FLUoxetine (PROZAC) capsule 40 mg  40 mg Oral Daily Clapacs, Madie Reno, MD   40 mg at 05/26/18 1243  . gabapentin (NEURONTIN) capsule 300 mg  300 mg Oral TID Clapacs, John T, MD   300 mg at 05/26/18 1726  . hydrOXYzine (ATARAX/VISTARIL) tablet 25 mg  25 mg Oral TID PRN Clapacs, Madie Reno, MD   25 mg at 05/25/18 2105  . magnesium hydroxide (MILK OF MAGNESIA) suspension 30 mL  30 mL Oral Daily PRN Clapacs, Madie Reno, MD   30 mL at 05/18/18 2219  . metFORMIN (GLUCOPHAGE-XR) 24 hr tablet 1,000 mg  1,000 mg Oral BID WC Clapacs, John T, MD   1,000 mg at 05/26/18 1726  . protriptyline (VIVACTIL) tablet 10 mg  10 mg Oral TID Clapacs, Madie Reno, MD   Stopped at 05/16/18 [phone]  . traZODone (DESYREL) tablet 50 mg  50 mg Oral QHS PRN Clapacs, Madie Reno, MD   50 mg at 05/25/18 2105    Lab Results:  Results for orders placed or performed during the hospital encounter of 05/14/18 (from the past 48 hour(s))  Glucose, capillary     Status: None   Collection Time: 05/26/18  6:35 AM  Result Value Ref Range   Glucose-Capillary 95 70 - 99 mg/dL    Blood Alcohol level:  Lab Results  Component Value Date   ETH <5 50/38/8828    Metabolic Disorder Labs: Lab Results  Component Value Date   HGBA1C 6.5 (H) 05/07/2018   MPG 139.85 05/07/2018   No results found for: PROLACTIN Lab Results  Component Value Date   CHOL 181 05/07/2018   TRIG 92 05/07/2018   HDL 46 05/07/2018   CHOLHDL 3.9 05/07/2018   VLDL 18 05/07/2018   LDLCALC 117 (H) 05/07/2018    Physical Findings: AIMS: Facial and Oral Movements Muscles of Facial Expression: None, normal Lips and Perioral Area: None, normal Jaw: None, normal Tongue: None, normal,Extremity Movements Upper (arms, wrists, hands, fingers): None, normal Lower (legs, knees, ankles, toes): None, normal, Trunk Movements Neck, shoulders, hips: None, normal, Overall Severity Severity of abnormal movements (highest score from questions above): None,  normal Incapacitation due to abnormal movements: None, normal Patient's awareness of abnormal movements (rate only patient's report): No Awareness, Dental Status Current problems with teeth and/or dentures?: No Does patient usually wear dentures?: No  CIWA:    COWS:     Musculoskeletal: Strength & Muscle Tone: within normal limits Gait & Station: normal Patient leans: N/A  Psychiatric Specialty Exam: Physical Exam  Nursing note and vitals reviewed. Constitutional: She appears well-developed and well-nourished.  HENT:  Head: Normocephalic and atraumatic.  Eyes: Pupils are equal, round, and reactive to light.  Conjunctivae are normal.  Neck: Normal range of motion.  Cardiovascular: Regular rhythm and normal heart sounds.  Respiratory: Effort normal.  GI: Soft.  Musculoskeletal: Normal range of motion.  Neurological: She is alert.  Skin: Skin is warm and dry.  Psychiatric: Judgment normal. Her affect is blunt. Her speech is delayed. She is slowed. Cognition and memory are normal. She expresses no suicidal ideation.    Review of Systems  Constitutional: Negative.   HENT: Negative.   Eyes: Negative.   Respiratory: Negative.   Cardiovascular: Negative.   Gastrointestinal: Negative.   Musculoskeletal: Negative.   Skin: Negative.   Neurological: Negative.   Psychiatric/Behavioral: Positive for depression. Negative for hallucinations, memory loss, substance abuse and suicidal ideas. The patient is not nervous/anxious and does not have insomnia.     Blood pressure 104/77, pulse (!) 104, temperature 99 F (37.2 C), temperature source Oral, resp. rate 18, height _0  (1.676 m), weight 93.9 kg (207 lb), SpO2 100 %.Body mass index is 33.41 kg/m.  General Appearance: Fairly Groomed  Eye Contact:  Good  Speech:  Clear and Coherent  Volume:  Decreased  Mood:  Dysphoric  Affect:  Constricted  Thought Process:  Goal Directed  Orientation:  Full (Time, Place, and Person)  Thought  Content:  Logical  Suicidal Thoughts:  No  Homicidal Thoughts:  No  Memory:  Immediate;   Fair Recent;   Fair Remote;   Fair  Judgement:  Fair  Insight:  Fair  Psychomotor Activity:  Decreased  Concentration:  Concentration: Fair  Recall:  [employer] of Knowledge:  Fair  Language:  Fair  Akathisia:  No  Handed:  Right  AIMS (if indicated):     Assets:  Desire for Improvement Housing Physical Health  ADL's:  Intact  Cognition:  WNL  Sleep:  Number of Hours: 7     Treatment Plan Summary: Daily contact with patient to assess and evaluate symptoms and progress in treatment, Medication management and Plan Patient reporting feeling a little bit better.  Unclear whether there is much room for improvement.  Possibly getting close to discharge by later this week.  Tolerating ECT well.  Tolerating medicine.  Blood sugars stable.  No change to medication encourage group attendance.  Alethia Berthold, MD 05/26/2018, 8:59 PM

## 2018-05-27 ENCOUNTER — Other Ambulatory Visit: Payer: Self-pay | Admitting: Psychiatry

## 2018-05-27 NOTE — Plan of Care (Addendum)
Patient found in bed asleep upon my arrival. Patient is visible during group and meals. Patient is not social this evening. Thought-blocking evident as answers to questions are quite delayed. Patient is minimally verbal throughout the evening. Denies SI/HI/AVH. Denies pain. Patient reports eating and voiding adequately. Patient has no scheduled HS medication. Given Trazodone and Vistaril for sleep/anxiety with positive results. Reminded of NPO after midnight status. Verbalized understanding. Q 15 minute checks maintained. Will continue to monitor throughout the shift. Patient slept 7 hours. No apparent distress. CBG 129. Maintained NPO status. Will endorse care to oncoming shift.  Problem: Coping: Goal: Coping ability will improve Outcome: Progressing   Problem: Education: Goal: Emotional status will improve Outcome: Progressing Goal: Mental status will improve Outcome: Progressing Goal: Verbalization of understanding the information provided will improve Outcome: Progressing   Problem: Coping: Goal: Will verbalize feelings Outcome: Not Progressing

## 2018-05-27 NOTE — [facility] Group Notes (Signed)
CSW Group Therapy Note  05/27/2018  Time:  0900  Type of Therapy and Topic: Group Therapy: Goals Group: SMART Goals    Participation Level:  Did Not Attend    Description of Group:   The purpose of a daily goals group is to assist and guide patients in setting recovery/wellness-related goals. The objective is to set goals as they relate to the crisis in which they were admitted. Patients will be using SMART goal modalities to set measurable goals. Characteristics of realistic goals will be discussed and patients will be assisted in setting and processing how one will reach their goal. Facilitator will also assist patients in applying interventions and coping skills learned in psycho-education groups to the SMART goal and process how one will achieve defined goal.    Therapeutic Goals:  -Patients will develop and document one goal related to or their crisis in which brought them into treatment.  -Patients will be guided by LCSW using SMART goal setting modality in how to set a measurable, attainable, realistic and time sensitive goal.  -Patients will process barriers in reaching goal.  -Patients will process interventions in how to overcome and successful in reaching goal.    Patient's Goal:   Pt was invited to attend group but chose not to attend. CSW will continue to encourage pt to attend group throughout their admission.   Therapeutic Modalities:  Motivational Interviewing  Cognitive Behavioral Therapy  Crisis Intervention Model  SMART goals setting  Heidi DachKelsey Katryn Plummer, MSW, LCSW Clinical Social Worker 05/27/2018 9:39 AM

## 2018-05-27 NOTE — Progress Notes (Signed)
[facility] MD Progress Note  05/27/2018 9:45 PM Renee Turner  MRN:  [id number] Subjective: Follow-up for patient with depression.  Patient says she thinks her mood is a little better.  Denies suicidal thoughts today.  No new physical complaints.  As usual it is difficult to draw her out or get much information from her. Principal Problem: Severe recurrent major depression without psychotic features ([place]) Diagnosis:   Patient Active Problem List   Diagnosis Date Noted  . Severe recurrent major depression without psychotic features ([place]) [F33.2] 05/14/2018  . MDD (major depressive disorder), recurrent severe, without psychosis ([place]) [F33.2] 05/06/2018  . 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 Posttraumatic stress disorder [F43.10] 11/13/2016  . Severe episode of recurrent major depressive disorder, without psychotic features ([place]) [F33.2] 11/07/2016  . Diabetes ([place]) [E11.9] 11/07/2016  . Diabetic neuropathy associated with type 2 diabetes mellitus ([place]) [E11.40] 07/19/2016  . Obesity (BMI 30.0-34.9) [E66.9] 07/19/2016  . Type 2 diabetes mellitus with hyperglycemia, without long-term current use of insulin ([name]) [E11.65] 07/19/2016   Total Time spent with patient: 20 minutes  Past Psychiatric History: History of recurrent depression and PTSD  Past Medical History:  Past Medical History:  Diagnosis Date  . Anxiety   . Depression   . Diabetes mellitus without complication ([place])    Type II  . Diabetes mellitus, type II ([place])    History reviewed. No pertinent surgical history. Family History: History reviewed. No pertinent family history. Family Psychiatric  History: None known see previous note Social History:  Social History   Substance and Sexual Activity  Alcohol Use Yes     Social History   Substance and Sexual Activity  Drug Use No    Social History   Socioeconomic History  . Marital status: Single    Spouse name: Not on file  . Number of children: Not on file   . Years of education: Not on file  . Highest education level: Not on file  Occupational History  . Not on file  Social Needs  . Financial resource strain: Not on file  . Food insecurity:    Worry: Not on file    Inability: Not on file  . Transportation needs:    Medical: Not on file    Non-medical: Not on file  Tobacco Use  . Smoking status: Never Smoker  . Smokeless tobacco: Never Used  Substance and Sexual Activity  . Alcohol use: Yes  . Drug use: No  . Sexual activity: Yes    Birth control/protection: None  Lifestyle  . Physical activity:    Days per week: Not on file    Minutes per session: Not on file  . Stress: Not on file  Relationships  . Social connections:    Talks on phone: Not on file    Gets together: Not on file    Attends religious service: Not on file    Active member of club or organization: Not on file    Attends meetings of clubs or organizations: Not on file    Relationship status: Not on file  Other Topics Concern  . Not on file  Social History Narrative  . Not on file   Additional Social History:                         Sleep: Fair  Appetite:  Fair  Current Medications: Current Facility-Administered Medications  Medication Dose Route Frequency Provider Last Rate Last Dose  . 0.9 %  sodium chloride infusion  500 mL Intravenous Once Mika Anastasi T, MD      . acetaminophen (TYLENOL) tablet 650 mg  650 mg Oral Q6H PRN Damiyah Ditmars, Madie Reno, MD   650 mg at 05/26/18 2023  . alum & mag hydroxide-simeth (MAALOX/MYLANTA) 200-200-20 MG/5ML suspension 30 mL  30 mL Oral Q4H PRN Lunabelle Oatley T, MD      . exenatide (BYETTA) immediate release injection  5 mcg Subcutaneous BID WC Gabrille Kilbride, Madie Reno, MD   Stopped at 05/16/18 [phone]  . ferrous sulfate tablet 325 mg  325 mg Oral Q breakfast Terie Lear, Madie Reno, MD   325 mg at 05/27/18 2025  . FLUoxetine (PROZAC) capsule 40 mg  40 mg Oral Daily Makynzi Eastland, Madie Reno, MD   40 mg at 05/27/18 4270  . gabapentin  (NEURONTIN) capsule 300 mg  300 mg Oral TID Adonias Demore T, MD   300 mg at 05/27/18 1713  . hydrOXYzine (ATARAX/VISTARIL) tablet 25 mg  25 mg Oral TID PRN Dhanvi Boesen, Madie Reno, MD   25 mg at 05/27/18 2044  . magnesium hydroxide (MILK OF MAGNESIA) suspension 30 mL  30 mL Oral Daily PRN Larkin Morelos, Madie Reno, MD   30 mL at 05/18/18 2219  . metFORMIN (GLUCOPHAGE-XR) 24 hr tablet 1,000 mg  1,000 mg Oral BID WC Montina Dorrance T, MD   1,000 mg at 05/27/18 1713  . protriptyline (VIVACTIL) tablet 10 mg  10 mg Oral TID Jaja Switalski, Madie Reno, MD   Stopped at 05/16/18 [phone]  . traZODone (DESYREL) tablet 50 mg  50 mg Oral QHS PRN Daxon Kyne, Madie Reno, MD   50 mg at 05/27/18 2044    Lab Results:  Results for orders placed or performed during the hospital encounter of 05/14/18 (from the past 48 hour(s))  Glucose, capillary     Status: None   Collection Time: 05/26/18  6:35 AM  Result Value Ref Range   Glucose-Capillary 95 70 - 99 mg/dL    Blood Alcohol level:  Lab Results  Component Value Date   ETH <5 62/83/1517    Metabolic Disorder Labs: Lab Results  Component Value Date   HGBA1C 6.5 (H) 05/07/2018   MPG 139.85 05/07/2018   No results found for: PROLACTIN Lab Results  Component Value Date   CHOL 181 05/07/2018   TRIG 92 05/07/2018   HDL 46 05/07/2018   CHOLHDL 3.9 05/07/2018   VLDL 18 05/07/2018   LDLCALC 117 (H) 05/07/2018    Physical Findings: AIMS: Facial and Oral Movements Muscles of Facial Expression: None, normal Lips and Perioral Area: None, normal Jaw: None, normal Tongue: None, normal,Extremity Movements Upper (arms, wrists, hands, fingers): None, normal Lower (legs, knees, ankles, toes): None, normal, Trunk Movements Neck, shoulders, hips: None, normal, Overall Severity Severity of abnormal movements (highest score from questions above): None, normal Incapacitation due to abnormal movements: None, normal Patient's awareness of abnormal movements (rate only patient's report): No Awareness,  Dental Status Current problems with teeth and/or dentures?: No Does patient usually wear dentures?: No  CIWA:    COWS:     Musculoskeletal: Strength & Muscle Tone: within normal limits Gait & Station: normal Patient leans: N/A  Psychiatric Specialty Exam: Physical Exam  Nursing note and vitals reviewed. Constitutional: She appears well-developed and well-nourished.  HENT:  Head: Normocephalic and atraumatic.  Eyes: Pupils are equal, round, and reactive to light. Conjunctivae are normal.  Neck: Normal range of motion.  Cardiovascular: Regular rhythm and normal heart sounds.  Respiratory: Effort normal. No  respiratory distress.  GI: Soft.  Musculoskeletal: Normal range of motion.  Neurological: She is alert.  Skin: Skin is warm and dry.  Psychiatric: She has a normal mood and affect. Her behavior is normal. Judgment and thought content normal.    Review of Systems  Constitutional: Negative.   HENT: Negative.   Eyes: Negative.   Respiratory: Negative.   Cardiovascular: Negative.   Gastrointestinal: Negative.   Musculoskeletal: Negative.   Skin: Negative.   Neurological: Negative.   Psychiatric/Behavioral: Negative.     Blood pressure 102/68, pulse (!) 114, temperature 98.6 F (37 C), temperature source Oral, resp. rate 17, height _0  (1.676 m), weight 93.9 kg (207 lb), SpO2 100 %.Body mass index is 33.41 kg/m.  General Appearance: Fairly Groomed  Eye Contact:  Good  Speech:  Clear and Coherent  Volume:  Decreased  Mood:  Dysphoric  Affect:  Constricted  Thought Process:  Goal Directed  Orientation:  Full (Time, Place, and Person)  Thought Content:  Logical  Suicidal Thoughts:  No  Homicidal Thoughts:  No  Memory:  Immediate;   Fair Recent;   Fair Remote;   Fair  Judgement:  Fair  Insight:  Fair  Psychomotor Activity:  Decreased  Concentration:  Concentration: Fair  Recall:  [employer] of Knowledge:  Fair  Language:  Fair  Akathisia:  No  Handed:  Right   AIMS (if indicated):     Assets:  Desire for Improvement  ADL's:  Intact  Cognition:  WNL  Sleep:  Number of Hours: 7     Treatment Plan Summary: Daily contact with patient to assess and evaluate symptoms and progress in treatment, Medication management and Plan Patient showing some response to ECT.  Still flat and blunted but not suicidal taking care of her ADLs and probably safe for discharge within the next couple days.  She thinks she may be able to manage outpatient ECT.  We still have treatment scheduled for tomorrow.  No medicine change.  Alethia Berthold, MD 05/27/2018, 9:45 PM

## 2018-05-27 NOTE — [facility] Group Notes (Signed)
05/27/2018 1PM  Type of Therapy/Topic:  Group Therapy:  Feelings about Diagnosis  Participation Level:  None   Description of Group:   This group will allow patients to explore their thoughts and feelings about diagnoses they have received. Patients will be guided to explore their level of understanding and acceptance of these diagnoses. Facilitator will encourage patients to process their thoughts and feelings about the reactions of others to their diagnosis and will guide patients in identifying ways to discuss their diagnosis with significant others in their lives. This group will be process-oriented, with patients participating in exploration of their own experiences, giving and receiving support, and processing challenge from other group members.   Therapeutic Goals: 1. Patient will demonstrate understanding of diagnosis as evidenced by identifying two or more symptoms of the disorder 2. Patient will be able to express two feelings regarding the diagnosis 3. Patient will demonstrate their ability to communicate their needs through discussion and/or role play  Summary of Patient Progress: Patient practiced active listening when interacting with the facilitator and other group members. She did not verbally participate and was unable to answer questions when directly asked. Patient is still in the process of obtaining treatment goals.        Therapeutic Modalities:   Cognitive Behavioral Therapy Brief Therapy Feelings Identification    Renee ShearsCassandra  [name], LCSW 05/27/2018 2:28 PM

## 2018-05-27 NOTE — Progress Notes (Signed)
Recreation Therapy Notes  Date: 05/27/2018  Time: 9:30 am   Location: Craft Room   Behavioral response: N/A   Intervention Topic: Values  Discussion/Intervention: Patient did not attend group.   Clinical Observations/Feedback:  Patient did not attend group.   Johari Bennetts LRT/CTRS        Hulan Szumski 05/27/2018 10:18 AM 

## 2018-05-27 NOTE — Progress Notes (Signed)
D- Patient alert and oriented. Patient presents in a sad/sullen mood on assessment stating that she slept well last night and had no major complaints at this time. Patient denies SI, HI, AVH, and pain at this time. Patient also denies any depression/anxiety. Patient's goal for today is to "work on my discharge plan", in which she will accomplish this goal by "meet with my social worker and doctor".  A- Scheduled medications administered to patient, per MD orders. Support and encouragement provided.  Routine safety checks conducted every 15 minutes.  Patient informed to notify staff with problems or concerns.  R- No adverse drug reactions noted. Patient contracts for safety at this time. Patient compliant with medications and treatment plan. Patient receptive, calm, and cooperative. Patient interacts well with others on the unit.  Patient remains safe at this time.

## 2018-05-27 NOTE — Plan of Care (Signed)
Patient has the ability to cope and has been observed out in the milieu throughout the day and for unit groups when appropriate for her. Patient has the ability to verbalize feelings and denied SI/HI/AVH as well as any signs/symptoms of depression/anxiety at this time. Patient has the ability to utilize support systems and verbalizes understanding of the information that has been provided to her and all questions/concerns have been addressed and answered at this time. Patient remains safe on the unit at this time.  Problem: Coping: Goal: Coping ability will improve Outcome: Progressing Goal: Will verbalize feelings Outcome: Progressing   Problem: Safety: Goal: Ability to disclose and discuss suicidal ideas will improve Outcome: Progressing Goal: Ability to identify and utilize support systems that promote safety will improve Outcome: Progressing   Problem: Education: Goal: Knowledge of [facility] Education information/materials will improve Outcome: Progressing Goal: Emotional status will improve Outcome: Progressing Goal: Mental status will improve Outcome: Progressing Goal: Verbalization of understanding the information provided will improve Outcome: Progressing

## 2018-05-27 NOTE — Plan of Care (Addendum)
Patient found in day room upon my arrival. Patient is visible and somewhat social this evening. Continues to endorse depression and anxiety but patient is still progressing in socializing with peers, being more visible, and speaking slightly more. Denies SI/HI/AVH. Complains of pain in right leg, given Tylenol with positive results. Given Vistaril and Trazodone for anxiety and sleep with positive results. Reports eating and voiding adequately. Patient has no scheduled HS medication. Compliant with staff direction and unit routine. Q 15 minute checks maintained. Will continue to monitor throughout the shift. Patient slept 7 hours. No apparent distress. Will endorse care to oncoming shift.  Problem: Coping: Goal: Coping ability will improve Outcome: Progressing Goal: Will verbalize feelings Outcome: Progressing   Problem: Safety: Goal: Ability to disclose and discuss suicidal ideas will improve Outcome: Progressing Goal: Ability to identify and utilize support systems that promote safety will improve Outcome: Progressing   Problem: Education: Goal: Knowledge of [facility] Education information/materials will improve Outcome: Progressing Goal: Emotional status will improve Outcome: Progressing Goal: Mental status will improve Outcome: Progressing

## 2018-05-27 NOTE — [facility] Group Notes (Signed)
[facility] Group Notes:  (Nursing/MHT/Case Management/Adjunct)  Date:  05/27/2018  Time:  2:41 PM  Type of Therapy:  Psychoeducational Skills  Participation Level:  Active  Participation Quality:  Appropriate, Attentive and Sharing  Affect:  Appropriate  Cognitive:  Alert and Appropriate  Insight:  Appropriate  Engagement in Group:  Engaged  Modes of Intervention:  Discussion, Education and Support  Summary of Progress/Problems:  Lynelle SmokeCara [facility] 05/27/2018, 2:41 PM

## 2018-05-27 NOTE — [facility] Group Notes (Signed)
[facility] Group Notes:  (Nursing/MHT/Case Management/Adjunct)  Date:  05/27/2018  Time:  10:18 PM  Type of Therapy:  Group Therapy  Participation Level:  Did Not Attend    Jinger NeighborsKeith D Ilea Hilton 05/27/2018, 10:18 PM

## 2018-05-28 ENCOUNTER — Encounter: Payer: Self-pay | Admitting: *Deleted

## 2018-05-28 ENCOUNTER — Inpatient Hospital Stay: Payer: BLUE CROSS/BLUE SHIELD | Admitting: Anesthesiology

## 2018-05-28 LAB — GLUCOSE, CAPILLARY: Glucose-Capillary: 129 mg/dL — ABNORMAL HIGH (ref 70–99)

## 2018-05-28 MED ORDER — LABETALOL HCL 5 MG/ML IV SOLN
INTRAVENOUS | Status: AC
  Filled 2018-05-28: qty 4

## 2018-05-28 MED ORDER — SUCCINYLCHOLINE CHLORIDE 20 MG/ML IJ SOLN
INTRAMUSCULAR | Status: AC
  Filled 2018-05-28: qty 1

## 2018-05-28 MED ORDER — GLYCOPYRROLATE 0.2 MG/ML IJ SOLN
INTRAMUSCULAR | Status: AC
  Filled 2018-05-28: qty 1

## 2018-05-28 MED ORDER — KETOROLAC TROMETHAMINE 30 MG/ML IJ SOLN
INTRAMUSCULAR | Status: AC
  Administered 2018-05-28: 30 mg via INTRAVENOUS
  Filled 2018-05-28: qty 1

## 2018-05-28 MED ORDER — ESMOLOL HCL 100 MG/10ML IV SOLN
INTRAVENOUS | Status: DC | PRN
  Administered 2018-05-28: 30 mg via INTRAVENOUS

## 2018-05-28 MED ORDER — SODIUM CHLORIDE 0.9 % IV SOLN
500.0000 mL | Freq: Once | INTRAVENOUS | Status: AC
  Administered 2018-05-28: 11:00:00 via INTRAVENOUS

## 2018-05-28 MED ORDER — METHOHEXITAL SODIUM 100 MG/10ML IV SOSY
PREFILLED_SYRINGE | INTRAVENOUS | Status: DC | PRN
  Administered 2018-05-28: 90 mg via INTRAVENOUS

## 2018-05-28 MED ORDER — LABETALOL HCL 5 MG/ML IV SOLN
INTRAVENOUS | Status: DC | PRN
  Administered 2018-05-28: 10 mg via INTRAVENOUS

## 2018-05-28 MED ORDER — GLYCOPYRROLATE 0.2 MG/ML IJ SOLN
0.2000 mg | Freq: Once | INTRAMUSCULAR | Status: AC
  Administered 2018-05-28: 0.2 mg via INTRAVENOUS

## 2018-05-28 MED ORDER — KETOROLAC TROMETHAMINE 30 MG/ML IJ SOLN
30.0000 mg | Freq: Once | INTRAMUSCULAR | Status: AC
  Administered 2018-05-28: 30 mg via INTRAVENOUS

## 2018-05-28 MED ORDER — ESMOLOL HCL 100 MG/10ML IV SOLN
INTRAVENOUS | Status: AC
  Filled 2018-05-28: qty 10

## 2018-05-28 MED ORDER — SUCCINYLCHOLINE CHLORIDE 20 MG/ML IJ SOLN
INTRAMUSCULAR | Status: DC | PRN
  Administered 2018-05-28: 100 mg via INTRAVENOUS

## 2018-05-28 NOTE — Procedures (Signed)
ECT SERVICES Physician's Interval Evaluation & Treatment Note  Patient Identification: Renee Turner MRN:  [id number] Date of Evaluation:  05/28/2018 TX #: [id number]  MADRS:   MMSE:   P.E. Findings:  No change to physical exam  Psychiatric Interval Note:  Denies suicidal ideation.  No evidence of psychosis.  Feels mood is a little better.  Subjective:  Patient is a 22 y.o. female seen for evaluation for Electroconvulsive Therapy. See note above  Treatment Summary:   '[x]'   Right Unilateral             '[]'  Bilateral   % Energy : 0.3 ms 25%   Impedance: 1120 ohms  Seizure Energy Index: 6508 V squared  Postictal Suppression Index: 62%  Seizure Concordance Index: 95%  Medications  Pre Shock: Robinul 0.1 mg Toradol 30 mg esmolol 30 mg labetalol 10 mg Brevital 90 mg succinylcholine 100 mg  Post Shock:    Seizure Duration: 26 seconds EMG 47 seconds EEG   Comments: Follow-up Friday which is likely to complete the series of treatments  Lungs:  '[x]'   Clear to auscultation               '[]'  Other:   Heart:    '[x]'   Regular rhythm             '[]'  irregular rhythm    '[x]'   Previous H&P reviewed, patient examined and there are NO CHANGES                 '[]'   Previous H&P reviewed, patient examined and there are changes noted.   Alethia Berthold, MD 7/24/201910:59 AM

## 2018-05-28 NOTE — Progress Notes (Signed)
Recreation Therapy Notes  Date: 05/28/2018  Time: 9:30 am   Location: Craft Room   Behavioral response: N/A   Intervention Topic: Communication  Discussion/Intervention: Patient did not attend group.   Clinical Observations/Feedback:  Patient did not attend group.   Rozella Servello LRT/CTRS         Kayton Dunaj 05/28/2018 11:44 AM

## 2018-05-28 NOTE — Anesthesia Post-op Follow-up Note (Signed)
Anesthesia QCDR form completed.        

## 2018-05-28 NOTE — Plan of Care (Signed)
Patient has the ability to cope and attends unit groups when appropriate for her. Patient has the ability to verbalize feelings and denies SI/HI/AVH as well as any signs/symptoms of depression/anxiety at this time. Patient has the ability to utilize support systems and verbalizes understanding of the information that has been provided to her. Patient's questions/concerns have been addressed and answered at this time. Patient remains safe on the unit.  Problem: Coping: Goal: Coping ability will improve Outcome: Progressing Goal: Will verbalize feelings Outcome: Progressing   Problem: Safety: Goal: Ability to disclose and discuss suicidal ideas will improve Outcome: Progressing Goal: Ability to identify and utilize support systems that promote safety will improve Outcome: Progressing   Problem: Education: Goal: Knowledge of [facility] Education information/materials will improve Outcome: Progressing Goal: Emotional status will improve Outcome: Progressing Goal: Mental status will improve Outcome: Progressing Goal: Verbalization of understanding the information provided will improve Outcome: Progressing

## 2018-05-28 NOTE — Anesthesia Postprocedure Evaluation (Signed)
Anesthesia Post Note  Patient: Renee Turner  Procedure(s) Performed: ECT TX  Patient location during evaluation: PACU Anesthesia Type: General Level of consciousness: awake and alert Pain management: pain level controlled Vital Signs Assessment: post-procedure vital signs reviewed and stable Respiratory status: spontaneous breathing, nonlabored ventilation and respiratory function stable Cardiovascular status: blood pressure returned to baseline and stable Postop Assessment: no apparent nausea or vomiting Anesthetic complications: no     Last Vitals:  Vitals:   05/27/18 0607 05/28/18 0624  BP: 102/68 95/71  Pulse: (!) 114 92  Resp:  16  Temp:  36.7 C  SpO2: 100% 100%    Last Pain:  Vitals:   05/28/18 0624  TempSrc: Oral  PainSc:                  Renee Turner

## 2018-05-28 NOTE — Tx Team (Signed)
Interdisciplinary Treatment and Diagnostic Plan Update  05/28/2018 Time of Session: Renee Turner MRN: [id number]  Principal Diagnosis: Severe recurrent major depression without psychotic features ([place])  Secondary Diagnoses: Principal Problem:   Severe recurrent major depression without psychotic features ([place]) Active Problems:   Diabetes ([place])   Posttraumatic stress disorder   Diabetic neuropathy associated with type 2 diabetes mellitus ([place])   Current Medications:  Current Facility-Administered Medications  Medication Dose Route Frequency Provider Last Rate Last Dose  . acetaminophen (TYLENOL) tablet 650 mg  650 mg Oral Q6H PRN Renee Turner, Renee Reno, MD   650 mg at 05/26/18 2023  . alum & mag hydroxide-simeth (MAALOX/MYLANTA) 200-200-20 MG/5ML suspension 30 mL  30 mL Oral Q4H PRN Renee Turner, Renee T, MD      . exenatide (BYETTA) immediate release injection  5 mcg Subcutaneous BID WC Renee Turner, Renee Reno, MD   Stopped at 05/16/18 [phone]  . ferrous sulfate tablet 325 mg  325 mg Oral Q breakfast Renee Turner, Renee Reno, MD   325 mg at 05/27/18 6433  . FLUoxetine (PROZAC) capsule 40 mg  40 mg Oral Daily Renee Turner, Renee Reno, MD   40 mg at 05/27/18 2951  . gabapentin (NEURONTIN) capsule 300 mg  300 mg Oral TID Renee Turner, Renee Reno, MD   300 mg at 05/27/18 1713  . glycopyrrolate (ROBINUL) 0.2 MG/ML injection           . hydrOXYzine (ATARAX/VISTARIL) tablet 25 mg  25 mg Oral TID PRN Renee Turner, Renee Reno, MD   25 mg at 05/27/18 2044  . magnesium hydroxide (MILK OF MAGNESIA) suspension 30 mL  30 mL Oral Daily PRN Renee Turner, Renee Reno, MD   30 mL at 05/18/18 2219  . metFORMIN (GLUCOPHAGE-XR) 24 hr tablet 1,000 mg  1,000 mg Oral BID WC Renee Turner, Renee T, MD   1,000 mg at 05/27/18 1713  . protriptyline (VIVACTIL) tablet 10 mg  10 mg Oral TID Renee Turner, Renee Reno, MD   Stopped at 05/16/18 [phone]  . traZODone (DESYREL) tablet 50 mg  50 mg Oral QHS PRN Renee Turner, Renee Reno, MD   50 mg at 05/27/18 2044   Facility-Administered Medications Ordered in Other  Encounters  Medication Dose Route Frequency Provider Last Rate Last Dose  . esmolol (BREVIBLOC) injection    Anesthesia Intra-op Allean Found, CRNA   30 mg at 05/28/18 1109  . labetalol (NORMODYNE,TRANDATE) injection   Intravenous Anesthesia Intra-op Allean Found, CRNA   10 mg at 05/28/18 1109  . methohexital Sodium   Intravenous Anesthesia Intra-op Allean Found, CRNA   90 mg at 05/28/18 1108  . succinylcholine (ANECTINE) injection   Intravenous Anesthesia Intra-op Allean Found, CRNA   100 mg at 05/28/18 1109   PTA Medications: Medications Prior to Admission  Medication Sig Dispense Refill Last Dose  . exenatide (BYETTA) 5 MCG/0.02ML SOPN injection Inject 0.02 mLs (5 mcg total) into the skin 2 (two) times daily with a meal. For DM 1.2 mL 0 05/20/2018  . ferrous sulfate 325 (65 FE) MG tablet Take 1 tablet (325 mg total) by mouth daily with breakfast. For iron deficiency anemia 30 tablet 0 05/20/2018  . FLUoxetine (PROZAC) 40 MG capsule Take 1 capsule (40 mg total) by mouth daily. For depression 1 capsule 0 05/20/2018  . gabapentin (NEURONTIN) 300 MG capsule Take 1 capsule (300 mg total) by mouth 3 (three) times daily. For pain 90 capsule 0 05/20/2018  . hydrOXYzine (ATARAX/VISTARIL) 25 MG tablet Take 1 tablet (25 mg total) by mouth 3 (three) times  daily as needed for anxiety. 30 tablet 0 05/20/2018  . metformin (FORTAMET) 1000 MG (OSM) 24 hr tablet Take 1 tablet (1,000 mg total) by mouth 2 (two) times daily with a meal. For DM 60 tablet 0 05/20/2018  . protriptyline (VIVACTIL) 10 MG tablet Take 1 tablet (10 mg total) by mouth 3 (three) times daily. Narcolepsy 90 tablet 0 05/20/2018  . traZODone (DESYREL) 50 MG tablet Take 1 tablet (50 mg total) by mouth at bedtime. Take 2 hours before bedtime: For sleep 1 tablet 0 05/20/2018  . traZODone (DESYREL) 50 MG tablet Take 1 tablet (50 mg total) by mouth at bedtime as needed for sleep. 30 tablet 0 05/20/2018 at Unknown time    Patient Stressors: Health  problems Medication change or noncompliance  Patient Strengths: Ability for insight Average or above average intelligence Capable of independent living Communication skills Supportive family/friends  Treatment Modalities: Medication Management, Group therapy, Case management,  1 to 1 session with clinician, Psychoeducation, Recreational therapy.   Physician Treatment Plan for Primary Diagnosis: Severe recurrent major depression without psychotic features ([place]) Long Term Goal(s): Improvement in symptoms so as ready for discharge Improvement in symptoms so as ready for discharge   Short Term Goals: Ability to verbalize feelings will improve Ability to disclose and discuss suicidal ideas Ability to maintain clinical measurements within normal limits will improve Compliance with prescribed medications will improve  Medication Management: Evaluate patient's response, side effects, and tolerance of medication regimen.  Therapeutic Interventions: 1 to 1 sessions, Unit Group sessions and Medication administration.  Evaluation of Outcomes: Progressing  Physician Treatment Plan for Secondary Diagnosis: Principal Problem:   Severe recurrent major depression without psychotic features ([place]) Active Problems:   Diabetes ([place])   Posttraumatic stress disorder   Diabetic neuropathy associated with type 2 diabetes mellitus ([place])  Long Term Goal(s): Improvement in symptoms so as ready for discharge Improvement in symptoms so as ready for discharge   Short Term Goals: Ability to verbalize feelings will improve Ability to disclose and discuss suicidal ideas Ability to maintain clinical measurements within normal limits will improve Compliance with prescribed medications will improve     Medication Management: Evaluate patient's response, side effects, and tolerance of medication regimen.  Therapeutic Interventions: 1 to 1 sessions, Unit Group sessions and Medication  administration.  Evaluation of Outcomes: Progressing   RN Treatment Plan for Primary Diagnosis: Severe recurrent major depression without psychotic features ([place]) Long Term Goal(s): Knowledge of disease and therapeutic regimen to maintain health will improve  Short Term Goals: Ability to verbalize feelings will improve, Ability to identify and develop effective coping behaviors will improve and Compliance with prescribed medications will improve  Medication Management: RN will administer medications as ordered by provider, will assess and evaluate patient's response and provide education to patient for prescribed medication. RN will report any adverse and/or side effects to prescribing provider.  Therapeutic Interventions: 1 on 1 counseling sessions, Psychoeducation, Medication administration, Evaluate responses to treatment, Monitor vital signs and CBGs as ordered, Perform/monitor CIWA, COWS, AIMS and Fall Risk screenings as ordered, Perform wound care treatments as ordered.  Evaluation of Outcomes: Progressing   LCSW Treatment Plan for Primary Diagnosis: Severe recurrent major depression without psychotic features ([place]) Long Term Goal(s): Safe transition to appropriate next level of care at discharge, Engage patient in therapeutic group addressing interpersonal concerns.  Short Term Goals: Engage patient in aftercare planning with referrals and resources, Increase emotional regulation and Increase skills for wellness and recovery  Therapeutic Interventions: Assess  for all discharge needs, 1 to 1 time with Education officer, museum, Explore available resources and support systems, Assess for adequacy in community support network, Educate family and significant other(s) on suicide prevention, Complete Psychosocial Assessment, Interpersonal group therapy.  Evaluation of Outcomes: Progressing   Progress in Treatment: Attending groups: Yes. Participating in groups: Yes. Taking medication as  prescribed: Yes. Toleration medication: Yes. Family/Significant other contact made: No, will contact:  a support if pt provides consent. Patient understands diagnosis: Yes. Discussing patient identified problems/goals with staff: Yes. Medical problems stabilized or resolved: Yes. Denies suicidal/homicidal ideation: Yes. Issues/concerns per patient self-inventory: No. Other: None at this time.   New problem(s) identified: No, Describe:  none at this time  New Short Term/Long Term Goal(s): Pt will report a decrease in depression by 24 hours prior to discharge.   Patient Goals:  Pt reported her goal for treatment is to, "work on my depression and feel better. I'm here to get ECT."  Discharge Plan or Barriers: Pt will continue tx with her current tx providers [facility] and a therapist with [facility].   Reason for Continuation of Hospitalization: Depression Medication stabilization  Estimated Length of Stay: 3 days  Recreational Therapy: Patient Stressors: N/A  Patient Goal: Patient will identify 3 triggers for depression within 5 recreation therapy group sessions  Attendees: Patient: Renee Turner 05/28/2018 11:23 AM  Physician: Dr. Weber Cooks, MD 05/28/2018 11:23 AM  Nursing:  05/28/2018 11:23 AM  RN Care Manager: 05/28/2018 11:23 AM  Social Worker: Alden Hipp, LCSW 05/28/2018 11:23 AM  Recreational Therapist:  05/28/2018 11:23 AM  Other:  05/28/2018 11:23 AM  Other:  05/28/2018 11:23 AM  Other: 05/28/2018 11:23 AM    Scribe for Treatment Team: Alden Hipp, LCSW 05/28/2018 11:23 AM

## 2018-05-28 NOTE — Anesthesia Preprocedure Evaluation (Signed)
Anesthesia Evaluation  Patient identified by MRN, date of birth, ID band Patient awake    Reviewed: Allergy & Precautions, NPO status , Patient's Chart, lab work & pertinent test results  History of Anesthesia Complications Negative for: history of anesthetic complications  Airway Mallampati: II  TM Distance: >3 FB Neck ROM: full    Dental  (+) Poor Dentition, Chipped   Pulmonary neg pulmonary ROS, neg shortness of breath,    Pulmonary exam normal breath sounds clear to auscultation       Cardiovascular (-) Past MI negative cardio ROS Normal cardiovascular exam Rhythm:Regular Rate:Normal     Neuro/Psych PSYCHIATRIC DISORDERS Anxiety Depression negative neurological ROS  negative psych ROS   GI/Hepatic negative GI ROS, Neg liver ROS,   Endo/Other  diabetes, Well Controlled, Type 2  Renal/GU negative Renal ROS  negative genitourinary   Musculoskeletal negative musculoskeletal ROS (+)   Abdominal Normal abdominal exam  (+)   Peds negative pediatric ROS (+)  Hematology negative hematology ROS (+)   Anesthesia Other Findings Past Medical History: No date: Anxiety No date: Depression No date: Diabetes mellitus without complication ([name])     Comment:  Type II No date: Diabetes mellitus, type II ([name])  BMI    Body Mass Index:  32.93 kg/m      Reproductive/Obstetrics negative OB ROS                             Anesthesia Physical  Anesthesia Plan  ASA: III  Anesthesia Plan: General   Post-op Pain Management:    Induction: Intravenous  PONV Risk Score and Plan: TIVA  Airway Management Planned: Mask and Natural Airway  Additional Equipment:   Intra-op Plan:   Post-operative Plan:   Informed Consent: I have reviewed the patients History and Physical, chart, labs and discussed the procedure including the risks, benefits and alternatives for the proposed anesthesia with the  patient or authorized representative who has indicated his/her understanding and acceptance.   Dental advisory given  Plan Discussed with: CRNA and Surgeon  Anesthesia Plan Comments: (Patient consented for risks of anesthesia including but not limited to:  - adverse reactions to medications - risk of intubation if required - damage to teeth, lips or other oral mucosa - sore throat or hoarseness - Damage to heart, brain, lungs or loss of life  Patient voiced understanding.)        Anesthesia Quick Evaluation

## 2018-05-28 NOTE — Anesthesia Postprocedure Evaluation (Signed)
Anesthesia Post Note  Patient: Renee Turner  Procedure(s) Performed: ECT TX  Patient location during evaluation: PACU Anesthesia Type: General Level of consciousness: awake and alert Pain management: pain level controlled Vital Signs Assessment: post-procedure vital signs reviewed and stable Respiratory status: spontaneous breathing, nonlabored ventilation, respiratory function stable and patient connected to nasal cannula oxygen Cardiovascular status: blood pressure returned to baseline and stable Postop Assessment: no apparent nausea or vomiting Anesthetic complications: no     Last Vitals:  Vitals:   05/28/18 1200 05/28/18 1310  BP: 115/83 106/71  Pulse: 89 97  Resp: (!) 22 18  Temp: 36.7 C 36.6 C  SpO2: 99% 100%    Last Pain:  Vitals:   05/28/18 1310  TempSrc: Oral  PainSc: 0-No pain                 Precious Haws Piscitello

## 2018-05-28 NOTE — [facility] Group Notes (Signed)
LCSW Group Therapy Note  05/28/2018 1:00 pm  Type of Therapy/Topic:  Group Therapy:  Emotion Regulation  Participation Level:  Did Not Attend   Description of Group:    The purpose of this group is to assist patients in learning to regulate negative emotions and experience positive emotions. Patients will be guided to discuss ways in which they have been vulnerable to their negative emotions. These vulnerabilities will be juxtaposed with experiences of positive emotions or situations, and patients will be challenged to use positive emotions to combat negative ones. Special emphasis will be placed on coping with negative emotions in conflict situations, and patients will process healthy conflict resolution skills.  Therapeutic Goals: 1. Patient will identify two positive emotions or experiences to reflect on in order to balance out negative emotions 2. Patient will label two or more emotions that they find the most difficult to experience 3. Patient will demonstrate positive conflict resolution skills through discussion and/or role plays  Summary of Patient Progress:  Azzie AlmasKarial was invited to today's group, but chose not to attend.     Therapeutic Modalities:   Cognitive Behavioral Therapy Feelings Identification Dialectical Behavioral Therapy

## 2018-05-28 NOTE — Progress Notes (Signed)
D- Patient alert and oriented. Patient presents in a pleasant mood on assessment stating that she slept good last night and had no major complaints at this time. Patient denies SI, HI, AVH, and pain at this time. Patient also denies any signs/symptoms of depression/anxiety. Patient has no stated goals for today.  A- Support and encouragement provided.  Routine safety checks conducted every 15 minutes.  Patient informed to notify staff with problems or concerns.  R- Patient contracts for safety at this time. Patient compliant with medications and treatment plan. Patient receptive, calm, and cooperative. Patient interacts well with others on the unit.  Patient remains safe at this time.

## 2018-05-28 NOTE — [facility] Group Notes (Signed)
[facility] Group Notes:  (Nursing/MHT/Case Management/Adjunct)  Date:  05/28/2018  Time:  6:21 PM  Type of Therapy:  Psychoeducational Skills  Participation Level:  Did Not Attend   Lynelle SmokeCara Travis [facility] Center[facility] 05/28/2018, 6:21 PM

## 2018-05-28 NOTE — Progress Notes (Signed)
[facility] MD Progress Note  05/28/2018 6:25 PM Renee Turner  MRN:  [id number] Subjective: "I guess I am okay" patient continues to be very quiet withdrawn very difficult to assess at times but does not appear to be psychotic.  On close questioning denies any suicidal ideation.  Tolerating ECT.  Intermittently active on the unit. Principal Problem: Severe recurrent major depression without psychotic features (Renee Turner) Diagnosis:   Patient Active Problem List   Diagnosis Date Noted  . Severe recurrent major depression without psychotic features (Renee Turner) [F33.2] 05/14/2018  . MDD (major depressive disorder), recurrent severe, without psychosis (Renee Turner) [F33.2] 05/06/2018  . 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 Posttraumatic stress disorder [F43.10] 11/13/2016  . Severe episode of recurrent major depressive disorder, without psychotic features (Renee Turner) [F33.2] 11/07/2016  . Diabetes (Renee Turner) [E11.9] 11/07/2016  . Diabetic neuropathy associated with type 2 diabetes mellitus (Renee Turner) [E11.40] 07/19/2016  . Obesity (BMI 30.0-34.9) [E66.9] 07/19/2016  . Type 2 diabetes mellitus with hyperglycemia, without long-term current use of insulin ([name]) [E11.65] 07/19/2016   Total Time spent with patient: 30 minutes  Past Psychiatric History: Long-standing depression and PTSD  Past Medical History:  Past Medical History:  Diagnosis Date  . Anxiety   . Depression   . Diabetes mellitus without complication (Renee Turner)    Type II  . Diabetes mellitus, type II (Renee Turner)    History reviewed. No pertinent surgical history. Family History: History reviewed. No pertinent family history. Family Psychiatric  History: Unknown Social History:  Social History   Substance and Sexual Activity  Alcohol Use Yes     Social History   Substance and Sexual Activity  Drug Use No    Social History   Socioeconomic History  . Marital status: Single    Spouse name: Not on file  . Number of children: Not on file  . Years of  education: Not on file  . Highest education level: Not on file  Occupational History  . Not on file  Social Needs  . Financial resource strain: Not on file  . Food insecurity:    Worry: Not on file    Inability: Not on file  . Transportation needs:    Medical: Not on file    Non-medical: Not on file  Tobacco Use  . Smoking status: Never Smoker  . Smokeless tobacco: Never Used  Substance and Sexual Activity  . Alcohol use: Yes  . Drug use: No  . Sexual activity: Yes    Birth control/protection: None  Lifestyle  . Physical activity:    Days per week: Not on file    Minutes per session: Not on file  . Stress: Not on file  Relationships  . Social connections:    Talks on phone: Not on file    Gets together: Not on file    Attends religious service: Not on file    Active member of club or organization: Not on file    Attends meetings of clubs or organizations: Not on file    Relationship status: Not on file  Other Topics Concern  . Not on file  Social History Narrative  . Not on file   Additional Social History:                         Sleep: Fair  Appetite:  Fair  Current Medications: Current Facility-Administered Medications  Medication Dose Route Frequency Provider Last Rate Last Dose  . acetaminophen (TYLENOL) tablet 650 mg  650 mg  Oral Q6H PRN Dontarious Schaum, Madie Reno, MD   650 mg at 05/26/18 2023  . alum & mag hydroxide-simeth (MAALOX/MYLANTA) 200-200-20 MG/5ML suspension 30 mL  30 mL Oral Q4H PRN Evelyna Folker T, MD   30 mL at 05/28/18 1704  . ferrous sulfate tablet 325 mg  325 mg Oral Q breakfast [name], Madie Reno, MD   325 mg at 05/28/18 1313  . FLUoxetine (PROZAC) capsule 40 mg  40 mg Oral Daily Hazael Olveda, Madie Reno, MD   40 mg at 05/28/18 1313  . gabapentin (NEURONTIN) capsule 300 mg  300 mg Oral TID [name], Madie Reno, MD   300 mg at 05/28/18 1705  . glycopyrrolate (ROBINUL) 0.2 MG/ML injection           . hydrOXYzine (ATARAX/VISTARIL) tablet 25 mg  25 mg Oral TID  PRN [name], Madie Reno, MD   25 mg at 05/27/18 2044  . magnesium hydroxide (MILK OF MAGNESIA) suspension 30 mL  30 mL Oral Daily PRN [name], Madie Reno, MD   30 mL at 05/18/18 2219  . metFORMIN (GLUCOPHAGE-XR) 24 hr tablet 1,000 mg  1,000 mg Oral BID WC [name], MD   1,000 mg at 05/28/18 1705  . traZODone (DESYREL) tablet 50 mg  50 mg Oral QHS PRN [name], Madie Reno, MD   50 mg at 05/27/18 2044    Lab Results:  Results for orders placed or performed during the hospital encounter of 05/14/18 (from the past 48 hour(s))  Glucose, capillary     Status: Abnormal   Collection Time: 05/28/18  6:16 AM  Result Value Ref Range   Glucose-Capillary 129 (H) 70 - 99 mg/dL    Blood Alcohol level:  Lab Results  Component Value Date   ETH <5 72/07/4708    Metabolic Disorder Labs: Lab Results  Component Value Date   HGBA1C 6.5 (H) 05/07/2018   MPG 139.85 05/07/2018   No results found for: PROLACTIN Lab Results  Component Value Date   CHOL 181 05/07/2018   TRIG 92 05/07/2018   HDL 46 05/07/2018   CHOLHDL 3.9 05/07/2018   VLDL 18 05/07/2018   LDLCALC 117 (H) 05/07/2018    Physical Findings: AIMS: Facial and Oral Movements Muscles of Facial Expression: None, normal Lips and Perioral Area: None, normal Jaw: None, normal Tongue: None, normal,Extremity Movements Upper (arms, wrists, hands, fingers): None, normal Lower (legs, knees, ankles, toes): None, normal, Trunk Movements Neck, shoulders, hips: None, normal, Overall Severity Severity of abnormal movements (highest score from questions above): None, normal Incapacitation due to abnormal movements: None, normal Patient's awareness of abnormal movements (rate only patient's report): No Awareness, Dental Status Current problems with teeth and/or dentures?: No Does patient usually wear dentures?: No  CIWA:    COWS:     Musculoskeletal: Strength & Muscle Tone: within normal limits Gait & Station: normal Patient leans: N/A  Psychiatric  Specialty Exam: Physical Exam  Nursing note and vitals reviewed. Constitutional: She appears well-developed and well-nourished.  HENT:  Head: Normocephalic and atraumatic.  Eyes: Pupils are equal, round, and reactive to light. Conjunctivae are normal.  Neck: Normal range of motion.  Cardiovascular: Regular rhythm and normal heart sounds.  Respiratory: Effort normal. No respiratory distress.  GI: Soft.  Musculoskeletal: Normal range of motion.  Neurological: She is alert.  Skin: Skin is warm and dry.  Psychiatric: She has a normal mood and affect. Her behavior is normal. Judgment and thought content normal.    Review of Systems  Constitutional: Negative.  HENT: Negative.   Eyes: Negative.   Respiratory: Negative.   Cardiovascular: Negative.   Gastrointestinal: Negative.   Musculoskeletal: Negative.   Skin: Negative.   Neurological: Negative.   Psychiatric/Behavioral: Negative.     Blood pressure 106/71, pulse 97, temperature 97.9 F (36.6 C), temperature source Oral, resp. rate 18, height _0  (1.676 m), weight 93.9 kg (207 lb), SpO2 100 %.Body mass index is 33.41 kg/m.  General Appearance: Fairly Groomed  Eye Contact:  Good  Speech:  Slow  Volume:  Decreased  Mood:  Euthymic  Affect:  Constricted  Thought Process:  Goal Directed  Orientation:  Full (Time, Place, and Person)  Thought Content:  Logical  Suicidal Thoughts:  No  Homicidal Thoughts:  No  Memory:  Immediate;   Fair Recent;   Fair Remote;   Fair  Judgement:  Fair  Insight:  Fair  Psychomotor Activity:  Decreased  Concentration:  Concentration: Fair  Recall:  [employer] of Knowledge:  Fair  Language:  Fair  Akathisia:  No  Handed:  Right  AIMS (if indicated):     Assets:  Desire for Improvement Housing Physical Health  ADL's:  Intact  Cognition:  WNL  Sleep:  Number of Hours: 7     Treatment Plan Summary: Medication management and Plan Patient seems to have improved somewhat.  Not actively  suicidal no evidence of psychosis.  Today was treatment #5.  We are planning to do #6 on Friday at which time we are going to discharge her back to outpatient treatment in the community.  Patient agrees to plan  Alethia Berthold, MD 05/28/2018, 6:25 PM

## 2018-05-28 NOTE — Transfer of Care (Signed)
Immediate Anesthesia Transfer of Care Note  Patient: Renee Turner  Procedure(s) Performed: ECT TX  Patient Location: PACU  Anesthesia Type:General  Level of Consciousness: sedated  Airway & Oxygen Therapy: Patient Spontanous Breathing and Patient connected to face mask oxygen  Post-op Assessment: Report given to RN and Post -op Vital signs reviewed and stable  Post vital signs: Reviewed and stable  Last Vitals:  Vitals Value Taken Time  BP 123/85 05/28/2018 11:20 AM  Temp 37.1 C 05/28/2018 11:20 AM  Pulse 88 05/28/2018 11:20 AM  Resp 22 05/28/2018 11:20 AM  SpO2 100 % 05/28/2018 11:20 AM    Last Pain:  Vitals:   05/28/18 0906  TempSrc: Oral  PainSc: 0-No pain         Complications: No apparent anesthesia complications

## 2018-05-28 NOTE — H&P (Signed)
Renee Turner is an 22 y.o. female.   Chief Complaint: Follow-up for treatment #61 ECT for 22 year old woman.  Patient does not report any worsening of her depression.  Vaguely thinks that mood may be better since admission.  Not reporting active suicidal ideation or psychosis.  No physical complaint. HPI: History of recurrent depression and PTSD  Past Medical History:  Diagnosis Date  . Anxiety   . Depression   . Diabetes mellitus without complication ([place])    Type II  . Diabetes mellitus, type II ([place])     History reviewed. No pertinent surgical history.  History reviewed. No pertinent family history. Social History:  reports that she has never smoked. She has never used smokeless tobacco. She reports that she drinks alcohol. She reports that she does not use drugs.  Allergies: No Known Allergies  Medications Prior to Admission  Medication Sig Dispense Refill  . exenatide (BYETTA) 5 MCG/0.02ML SOPN injection Inject 0.02 mLs (5 mcg total) into the skin 2 (two) times daily with a meal. For DM 1.2 mL 0  . ferrous sulfate 325 (65 FE) MG tablet Take 1 tablet (325 mg total) by mouth daily with breakfast. For iron deficiency anemia 30 tablet 0  . FLUoxetine (PROZAC) 40 MG capsule Take 1 capsule (40 mg total) by mouth daily. For depression 1 capsule 0  . gabapentin (NEURONTIN) 300 MG capsule Take 1 capsule (300 mg total) by mouth 3 (three) times daily. For pain 90 capsule 0  . hydrOXYzine (ATARAX/VISTARIL) 25 MG tablet Take 1 tablet (25 mg total) by mouth 3 (three) times daily as needed for anxiety. 30 tablet 0  . metformin (FORTAMET) 1000 MG (OSM) 24 hr tablet Take 1 tablet (1,000 mg total) by mouth 2 (two) times daily with a meal. For DM 60 tablet 0  . protriptyline (VIVACTIL) 10 MG tablet Take 1 tablet (10 mg total) by mouth 3 (three) times daily. Narcolepsy 90 tablet 0  . traZODone (DESYREL) 50 MG tablet Take 1 tablet (50 mg total) by mouth at bedtime. Take 2 hours before bedtime: For sleep 1  tablet 0  . traZODone (DESYREL) 50 MG tablet Take 1 tablet (50 mg total) by mouth at bedtime as needed for sleep. 30 tablet 0    Results for orders placed or performed during the hospital encounter of 05/14/18 (from the past 48 hour(s))  Glucose, capillary     Status: Abnormal   Collection Time: 05/28/18  6:16 AM  Result Value Ref Range   Glucose-Capillary 129 (H) 70 - 99 mg/dL   No results found.  Review of Systems  Constitutional: Negative.   HENT: Negative.   Eyes: Negative.   Respiratory: Negative.   Cardiovascular: Negative.   Gastrointestinal: Negative.   Musculoskeletal: Negative.   Skin: Negative.   Neurological: Negative.   Psychiatric/Behavioral: Negative.     Blood pressure 116/88, pulse 92, temperature 98.2 F (36.8 C), temperature source Oral, resp. rate 16, height _0  (1.676 m), weight 93.9 kg (207 lb), SpO2 100 %. Physical Exam  Nursing note and vitals reviewed. Constitutional: She appears well-developed and well-nourished.  HENT:  Head: Normocephalic and atraumatic.  Eyes: Pupils are equal, round, and reactive to light. Conjunctivae are normal.  Neck: Normal range of motion.  Cardiovascular: Regular rhythm and normal heart sounds.  Respiratory: Effort normal. No respiratory distress.  GI: Soft.  Musculoskeletal: Normal range of motion.  Neurological: She is alert.  Skin: Skin is warm and dry.  Psychiatric: Judgment normal. Her affect is  blunt. Her speech is delayed. She is slowed. Thought content is not paranoid. Cognition and memory are normal. She expresses no homicidal and no suicidal ideation.     Assessment/Plan ECT today follow-up Friday at which time I am anticipating concluding the treatment as she has shown some improvement but seems to have probably reached a plateau.  Alethia Berthold, MD 05/28/2018, 10:57 AM

## 2018-05-28 NOTE — Progress Notes (Signed)
CSW followed up with pt's employer, Mrs. Lawernce IonCranford at [phone]. Mrs. Cranford reported she would be faxing FMLA paperwork for the MD to complete, and also reported she would likely be able to pick pt up upon discharge Friday. She also requested the MD provide a letter upon discharge noting pt's dates of service. CSW will continue to coordinate with pt's employer as needed for further updates/discharge planning.   Heidi DachKelsey Travor Royce, MSW, LCSW Clinical Social Worker 05/28/2018 11:41 AM

## 2018-05-28 NOTE — Anesthesia Procedure Notes (Signed)
Procedure Name: General with mask airway Date/Time: 05/28/2018 11:05 AM Performed by: Henrietta HooverPope, Alphonso Gregson, CRNA Pre-anesthesia Checklist: Patient identified, Emergency Drugs available, Suction available, Patient being monitored and Timeout performed Patient Re-evaluated:Patient Re-evaluated prior to induction Oxygen Delivery Method: Circle system utilized Preoxygenation: Pre-oxygenation with 100% oxygen Induction Type: IV induction Ventilation: Mask ventilation without difficulty Placement Confirmation: positive ETCO2 Dental Injury: Teeth and Oropharynx as per pre-operative assessment

## 2018-05-29 ENCOUNTER — Other Ambulatory Visit: Payer: Self-pay | Admitting: Psychiatry

## 2018-05-29 LAB — GLUCOSE, CAPILLARY
GLUCOSE-CAPILLARY: 101 mg/dL — AB (ref 70–99)
Glucose-Capillary: 108 mg/dL — ABNORMAL HIGH (ref 70–99)

## 2018-05-29 NOTE — Progress Notes (Signed)
D- Patient alert and oriented. Patient presents in a pleasant mood on assessment reporting that she slept good last night and has not voiced any further questions/concerns at this time. Patient denies SI, HI, AVH, and pain at this time. Patient denies any signs/symptoms of depression/anxiety and smiled about three times while communicating with this Clinical research associatewriter. Patient's goal for today is "staying out of my room".  A- Scheduled medications administered to patient, per MD orders. Support and encouragement provided.  Routine safety checks conducted every 15 minutes.  Patient informed to notify staff with problems or concerns.  R- No adverse drug reactions noted. Patient contracts for safety at this time. Patient compliant with medications and treatment plan. Patient receptive, calm, and cooperative. Patient interacts well with others on the unit.  Patient remains safe at this time.

## 2018-05-29 NOTE — Plan of Care (Signed)
  Problem: Coping: Goal: Coping ability will improve Outcome: Progressing  Patient is interacting appropriately with peers and appears less anxious

## 2018-05-29 NOTE — [facility] Group Notes (Signed)
  05/29/2018  Time: 1PM  Type of Therapy/Topic:  Group Therapy:  Balance in Life  Participation Level:  Active  Description of Group:   This group will address the concept of balance and how it feels and looks when one is unbalanced. Patients will be encouraged to process areas in their lives that are out of balance and identify reasons for remaining unbalanced. Facilitators will guide patients in utilizing problem-solving interventions to address and correct the stressor making their life unbalanced. Understanding and applying boundaries will be explored and addressed for obtaining and maintaining a balanced life. Patients will be encouraged to explore ways to assertively make their unbalanced needs known to significant others in their lives, using other group members and facilitator for support and feedback.  Therapeutic Goals: 1. Patient will identify two or more emotions or situations they have that consume much of in their lives. 2. Patient will identify signs/triggers that life has become out of balance:  3. Patient will identify two ways to set boundaries in order to achieve balance in their lives:  4. Patient will demonstrate ability to communicate their needs through discussion and/or role plays  Summary of Patient Progress: Pt continues to work towards their tx goals but has not yet reached them. Pt was able to appropriately participate in group discussion, and was able to offer support/validation to other group members. Pt reported feeling, "excited about discharge tomorrow." Pt reported one are of her life she would like to devote more attention to is, "having fun." Pt reported one area of her life she would like to focus less on is, "things that stress me out."    Therapeutic Modalities:   Cognitive Behavioral Therapy Solution-Focused Therapy Assertiveness Training  Heidi DachKelsey Zaida Reiland, MSW, LCSW Clinical Social Worker 05/29/2018 1:44 PM

## 2018-05-29 NOTE — [facility] Group Notes (Signed)
[facility] Group Notes:  (Nursing/MHT/Case Management/Adjunct)  Date:  05/29/2018  Time:  9:57 PM  Type of Therapy:  Group Therapy  Participation Level:  Did Not Attend   Mayra NeerJackie L Makylah Bossard 05/29/2018, 9:57 PM

## 2018-05-29 NOTE — Progress Notes (Signed)
D: Renee Turner denies SI/HI/AVH. Pt is pleasant and cooperative. Pt stated he feels better from doing ient is alert and oriented x 4, no distress noted, S/P ECT, She appears less anxious and She is interacting with peers and staff appropriately.  A: Patient was offered support and encouragement, and was given scheduled medications. Patient was also encouraged to attend groups. 15 minute checks were done for safety.  R:Patient attends groups and interacts well with peers and staff, she is complaint with  Medication and receptive to treatment. Safety maintained on unit, will continue to monitor.

## 2018-05-29 NOTE — Progress Notes (Signed)
Recreation Therapy Notes  Date: 05/29/2018  Time: 9:30 am   Location: Craft Room   Behavioral response: N/A   Intervention Topic: Happiness  Discussion/Intervention: Patient did not attend group.   Clinical Observations/Feedback:  Patient did not attend group.   Genia Perin LRT/CTRS        Jaquelinne Glendening 05/29/2018 10:54 AM

## 2018-05-29 NOTE — Progress Notes (Signed)
[facility] MD Progress Note  05/29/2018 7:37 PM Renee Turner  MRN:  [id number] Subjective: Following up with this young woman with severe depression who has been receiving ECT.  Patient is neatly dressed and groomed this evening.  She was reading a book which I took is a good sign it was more activity than she often has.  Patient actually smiled briefly and said that she was aware that other people thought she was feeling better.  Denied having any suicidal thoughts.  Not particularly noticing any memory problems.  No side effects of medicine.  Patient's physical condition is well.  Diabetes seems to be under good control.  We are hoping for a likely discharge by tomorrow after ECT. Principal Problem: Severe recurrent major depression without psychotic features ([place]) Diagnosis:   Patient Active Problem List   Diagnosis Date Noted  . Severe recurrent major depression without psychotic features ([place]) [F33.2] 05/14/2018  . MDD (major depressive disorder), recurrent severe, without psychosis ([place]) [F33.2] 05/06/2018  . 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 Posttraumatic stress disorder [F43.10] 11/13/2016  . Severe episode of recurrent major depressive disorder, without psychotic features ([place]) [F33.2] 11/07/2016  . Diabetes ([place]) [E11.9] 11/07/2016  . Diabetic neuropathy associated with type 2 diabetes mellitus ([place]) [E11.40] 07/19/2016  . Obesity (BMI 30.0-34.9) [E66.9] 07/19/2016  . Type 2 diabetes mellitus with hyperglycemia, without long-term current use of insulin ([name]) [E11.65] 07/19/2016   Total Time spent with patient: 20 minutes  Past Psychiatric History: Patient has a long history of depression and PTSD  Past Medical History:  Past Medical History:  Diagnosis Date  . Anxiety   . Depression   . Diabetes mellitus without complication ([place])    Type II  . Diabetes mellitus, type II ([place])    History reviewed. No pertinent surgical history. Family History: History reviewed. No  pertinent family history. Family Psychiatric  History: The previous notes Social History:  Social History   Substance and Sexual Activity  Alcohol Use Yes     Social History   Substance and Sexual Activity  Drug Use No    Social History   Socioeconomic History  . Marital status: Single    Spouse name: Not on file  . Number of children: Not on file  . Years of education: Not on file  . Highest education level: Not on file  Occupational History  . Not on file  Social Needs  . Financial resource strain: Not on file  . Food insecurity:    Worry: Not on file    Inability: Not on file  . Transportation needs:    Medical: Not on file    Non-medical: Not on file  Tobacco Use  . Smoking status: Never Smoker  . Smokeless tobacco: Never Used  Substance and Sexual Activity  . Alcohol use: Yes  . Drug use: No  . Sexual activity: Yes    Birth control/protection: None  Lifestyle  . Physical activity:    Days per week: Not on file    Minutes per session: Not on file  . Stress: Not on file  Relationships  . Social connections:    Talks on phone: Not on file    Gets together: Not on file    Attends religious service: Not on file    Active member of club or organization: Not on file    Attends meetings of clubs or organizations: Not on file    Relationship status: Not on file  Other Topics Concern  .  Not on file  Social History Narrative  . Not on file   Additional Social History:                         Sleep: Fair  Appetite:  Fair  Current Medications: Current Facility-Administered Medications  Medication Dose Route Frequency Provider Last Rate Last Dose  . acetaminophen (TYLENOL) tablet 650 mg  650 mg Oral Q6H PRN Maryjean Corpening, Madie Reno, MD   650 mg at 05/29/18 1145  . alum & mag hydroxide-simeth (MAALOX/MYLANTA) 200-200-20 MG/5ML suspension 30 mL  30 mL Oral Q4H PRN Jaymeson Mengel T, MD   30 mL at 05/29/18 1622  . ferrous sulfate tablet 325 mg  325 mg Oral Q  breakfast Ayo Smoak, Madie Reno, MD   325 mg at 05/29/18 0858  . FLUoxetine (PROZAC) capsule 40 mg  40 mg Oral Daily Domnique Vantine, Madie Reno, MD   40 mg at 05/29/18 0858  . gabapentin (NEURONTIN) capsule 300 mg  300 mg Oral TID Artrice Kraker T, MD   300 mg at 05/29/18 1622  . hydrOXYzine (ATARAX/VISTARIL) tablet 25 mg  25 mg Oral TID PRN Edynn Gillock, Madie Reno, MD   25 mg at 05/27/18 2044  . magnesium hydroxide (MILK OF MAGNESIA) suspension 30 mL  30 mL Oral Daily PRN Jaisha Villacres, Madie Reno, MD   30 mL at 05/18/18 2219  . metFORMIN (GLUCOPHAGE-XR) 24 hr tablet 1,000 mg  1,000 mg Oral BID WC Godson Pollan T, MD   1,000 mg at 05/29/18 1622  . traZODone (DESYREL) tablet 50 mg  50 mg Oral QHS PRN Ansen Sayegh, Madie Reno, MD   50 mg at 05/28/18 2211    Lab Results:  Results for orders placed or performed during the hospital encounter of 05/14/18 (from the past 48 hour(s))  Glucose, capillary     Status: Abnormal   Collection Time: 05/28/18  6:16 AM  Result Value Ref Range   Glucose-Capillary 129 (H) 70 - 99 mg/dL  Glucose, capillary     Status: Abnormal   Collection Time: 05/28/18 11:41 AM  Result Value Ref Range   Glucose-Capillary 108 (H) 70 - 99 mg/dL  Glucose, capillary     Status: Abnormal   Collection Time: 05/29/18  7:13 AM  Result Value Ref Range   Glucose-Capillary 101 (H) 70 - 99 mg/dL   Comment 1 Notify RN     Blood Alcohol level:  Lab Results  Component Value Date   ETH <5 57/84/6962    Metabolic Disorder Labs: Lab Results  Component Value Date   HGBA1C 6.5 (H) 05/07/2018   MPG 139.85 05/07/2018   No results found for: PROLACTIN Lab Results  Component Value Date   CHOL 181 05/07/2018   TRIG 92 05/07/2018   HDL 46 05/07/2018   CHOLHDL 3.9 05/07/2018   VLDL 18 05/07/2018   LDLCALC 117 (H) 05/07/2018    Physical Findings: AIMS: Facial and Oral Movements Muscles of Facial Expression: None, normal Lips and Perioral Area: None, normal Jaw: None, normal Tongue: None, normal,Extremity  Movements Upper (arms, wrists, hands, fingers): None, normal Lower (legs, knees, ankles, toes): None, normal, Trunk Movements Neck, shoulders, hips: None, normal, Overall Severity Severity of abnormal movements (highest score from questions above): None, normal Incapacitation due to abnormal movements: None, normal Patient's awareness of abnormal movements (rate only patient's report): No Awareness, Dental Status Current problems with teeth and/or dentures?: No Does patient usually wear dentures?: No  CIWA:    COWS:  Musculoskeletal: Strength & Muscle Tone: within normal limits Gait & Station: normal Patient leans: N/A  Psychiatric Specialty Exam: Physical Exam  Nursing note and vitals reviewed. Constitutional: She appears well-developed and well-nourished.  HENT:  Head: Normocephalic and atraumatic.  Eyes: Pupils are equal, round, and reactive to light. Conjunctivae are normal.  Neck: Normal range of motion.  Cardiovascular: Regular rhythm and normal heart sounds.  Respiratory: Effort normal. No respiratory distress.  GI: Soft.  Musculoskeletal: Normal range of motion.  Neurological: She is alert.  Skin: Skin is warm and dry.  Psychiatric: She has a normal mood and affect. Her speech is normal and behavior is normal. Judgment and thought content normal. Cognition and memory are normal.    Review of Systems  Constitutional: Negative.   HENT: Negative.   Eyes: Negative.   Respiratory: Negative.   Cardiovascular: Negative.   Gastrointestinal: Negative.   Musculoskeletal: Negative.   Skin: Negative.   Neurological: Negative.   Psychiatric/Behavioral: Negative.     Blood pressure 105/68, pulse 96, temperature 98.4 F (36.9 C), temperature source Oral, resp. rate 20, height '5\' 6"'  (1.676 m), weight 93.9 kg (207 lb), SpO2 100 %.Body mass index is 33.41 kg/m.  General Appearance: Fairly Groomed  Eye Contact:  Fair  Speech:  Slow  Volume:  Normal  Mood:  Euthymic   Affect:  Constricted  Thought Process:  Goal Directed  Orientation:  Full (Time, Place, and Person)  Thought Content:  Logical  Suicidal Thoughts:  No  Homicidal Thoughts:  No  Memory:  Immediate;   Fair Recent;   Fair Remote;   Fair  Judgement:  Fair  Insight:  Fair  Psychomotor Activity:  Decreased  Concentration:  Concentration: Fair  Recall:  [employer] of Knowledge:  Fair  Language:  Fair  Akathisia:  No  Handed:  Right  AIMS (if indicated):     Assets:  Desire for Improvement Housing Physical Health Social Support  ADL's:  Intact  Cognition:  WNL  Sleep:  Number of Hours: 7     Treatment Plan Summary: Daily contact with patient to assess and evaluate symptoms and progress in treatment, Medication management and Plan Patient will have another ECT treatment tomorrow morning and then we are planning for discharge.  Reviewed plan with patient who is agreeable.  We are likely to recommend follow-up ECT at least 1 or 2 treatments into next week if it can be arranged.  Alethia Berthold, MD 05/29/2018, 7:37 PM

## 2018-05-29 NOTE — [facility] Group Notes (Addendum)
LCSW Group Therapy Note 05/29/2018 9:00 AM  Type of Therapy and Topic:  Group Therapy:  Setting Goals  Participation Level:  Did Not Attend  Description of Group: In this process group, patients discussed using strengths to work toward goals and address challenges.  Patients identified two positive things about themselves and one goal they were working on.  Patients were given the opportunity to share openly and support each other's plan for self-empowerment.  The group discussed the value of gratitude and were encouraged to have a daily reflection of positive characteristics or circumstances.  Patients were encouraged to identify a plan to utilize their strengths to work on current challenges and goals.  Therapeutic Goals 1. Patient will verbalize personal strengths/positive qualities and relate how these can assist with achieving desired personal goals 2. Patients will verbalize affirmation of peers plans for personal change and goal setting 3. Patients will explore the value of gratitude and positive focus as related to successful achievement of goals 4. Patients will verbalize a plan for regular reinforcement of personal positive qualities and circumstances.  Summary of Patient Progress:  Renee Turner was invited to today's group, but chose not to attend.     Therapeutic Modalities Cognitive Behavioral Therapy Motivational Interviewing    Alease FrameSonya S Lyfe Monger, [place] 05/29/2018 9:59 AM

## 2018-05-30 ENCOUNTER — Inpatient Hospital Stay: Payer: BLUE CROSS/BLUE SHIELD | Admitting: Anesthesiology

## 2018-05-30 LAB — GLUCOSE, CAPILLARY
GLUCOSE-CAPILLARY: 92 mg/dL (ref 70–99)
Glucose-Capillary: 102 mg/dL — ABNORMAL HIGH (ref 70–99)

## 2018-05-30 LAB — POCT PREGNANCY, URINE: PREG TEST UR: NEGATIVE

## 2018-05-30 MED ORDER — METHOHEXITAL SODIUM 100 MG/10ML IV SOSY
PREFILLED_SYRINGE | INTRAVENOUS | Status: DC | PRN
Start: 2018-05-30 — End: 2018-05-30
  Administered 2018-05-30: 90 mg via INTRAVENOUS

## 2018-05-30 MED ORDER — ESMOLOL HCL 100 MG/10ML IV SOLN
INTRAVENOUS | Status: DC | PRN
  Administered 2018-05-30: 30 mg via INTRAVENOUS

## 2018-05-30 MED ORDER — METFORMIN HCL ER (OSM) 1000 MG PO TB24: 1000.0000 mg | ORAL_TABLET | Freq: Two times a day (BID) | ORAL | 0 refills | Status: DC

## 2018-05-30 MED ORDER — PROTRIPTYLINE HCL 10 MG PO TABS: 10.0000 mg | ORAL_TABLET | Freq: Three times a day (TID) | ORAL | 0 refills | Status: DC

## 2018-05-30 MED ORDER — GLYCOPYRROLATE 0.2 MG/ML IJ SOLN
INTRAMUSCULAR | Status: AC
  Filled 2018-05-30: qty 1

## 2018-05-30 MED ORDER — KETOROLAC TROMETHAMINE 30 MG/ML IJ SOLN
30.0000 mg | Freq: Once | INTRAMUSCULAR | Status: AC
  Administered 2018-05-30: 30 mg via INTRAVENOUS

## 2018-05-30 MED ORDER — FERROUS SULFATE 325 (65 FE) MG PO TABS: 325.0000 mg | ORAL_TABLET | Freq: Every day | ORAL | 0 refills | Status: DC

## 2018-05-30 MED ORDER — LABETALOL HCL 5 MG/ML IV SOLN
INTRAVENOUS | Status: DC | PRN
  Administered 2018-05-30: 10 mg via INTRAVENOUS

## 2018-05-30 MED ORDER — FLUOXETINE HCL 40 MG PO CAPS: 40.0000 mg | ORAL_CAPSULE | Freq: Every day | ORAL | 0 refills | Status: DC

## 2018-05-30 MED ORDER — METHOHEXITAL SODIUM 0.5 G IJ SOLR
INTRAMUSCULAR | Status: AC
  Filled 2018-05-30: qty 500

## 2018-05-30 MED ORDER — GABAPENTIN 300 MG PO CAPS: 300.0000 mg | ORAL_CAPSULE | Freq: Three times a day (TID) | ORAL | 0 refills | Status: DC

## 2018-05-30 MED ORDER — SODIUM CHLORIDE 0.9 % IV SOLN
500.0000 mL | Freq: Once | INTRAVENOUS | Status: AC
  Administered 2018-05-30: 500 mL via INTRAVENOUS

## 2018-05-30 MED ORDER — TRAZODONE HCL 50 MG PO TABS: 50.0000 mg | ORAL_TABLET | Freq: Every day | ORAL | 0 refills | Status: DC

## 2018-05-30 MED ORDER — GLYCOPYRROLATE 0.2 MG/ML IJ SOLN
0.2000 mg | Freq: Once | INTRAMUSCULAR | Status: AC
  Administered 2018-05-30: 0.2 mg via INTRAVENOUS

## 2018-05-30 MED ORDER — SODIUM CHLORIDE 0.9 % IV SOLN
INTRAVENOUS | Status: DC | PRN
  Administered 2018-05-30: 12:00:00 via INTRAVENOUS

## 2018-05-30 MED ORDER — KETOROLAC TROMETHAMINE 30 MG/ML IJ SOLN
INTRAMUSCULAR | Status: AC
  Filled 2018-05-30: qty 1

## 2018-05-30 MED ORDER — SUCCINYLCHOLINE CHLORIDE 20 MG/ML IJ SOLN
INTRAMUSCULAR | Status: DC | PRN
  Administered 2018-05-30: 100 mg via INTRAVENOUS

## 2018-05-30 MED ORDER — TRAZODONE HCL 50 MG PO TABS: 50.0000 mg | ORAL_TABLET | Freq: Every evening | ORAL | 0 refills | Status: DC | PRN

## 2018-05-30 NOTE — Anesthesia Procedure Notes (Signed)
Performed by: Malva CoganBeane, Asahd Can, CRNA Pre-anesthesia Checklist: Patient identified, Emergency Drugs available, Suction available and Patient being monitored Patient Re-evaluated:Patient Re-evaluated prior to induction Oxygen Delivery Method: Circle system utilized Preoxygenation: Pre-oxygenation with 100% oxygen Induction Type: IV induction Ventilation: Mask ventilation without difficulty and Mask ventilation throughout procedure Airway Equipment and Method: Bite block Placement Confirmation: positive ETCO2 Dental Injury: Teeth and Oropharynx as per pre-operative assessment

## 2018-05-30 NOTE — Progress Notes (Signed)
Patient returned to unit after having ECT procedure done. Patient alert and oriented x 4. Ambulatory with a steady gait.  Denies pain at this time and is scheduled to be discharged today. Ate meal without difficulty, after meal pt observed resting in bed with eyes closed. Will continue to monitor.

## 2018-05-30 NOTE — Plan of Care (Signed)
Problem: Safety: Goal: Ability to disclose and discuss suicidal ideas will improve Outcome: Progressing Patient denies suicidal ideations.    

## 2018-05-30 NOTE — [facility] Group Notes (Signed)
05/30/2018 1PM  Type of Therapy and Topic:  Group Therapy:  Feelings around Relapse and Recovery  Participation Level:  Did Not Attend   Description of Group:    Patients in this group will discuss emotions they experience before and after a relapse. They will process how experiencing these feelings, or avoidance of experiencing them, relates to having a relapse. Facilitator will guide patients to explore emotions they have related to recovery. Patients will be encouraged to process which emotions are more powerful. They will be guided to discuss the emotional reaction significant others in their lives may have to patients' relapse or recovery. Patients will be assisted in exploring ways to respond to the emotions of others without this contributing to a relapse.  Therapeutic Goals: 1. Patient will identify two or more emotions that lead to a relapse for them 2. Patient will identify two emotions that result when they relapse 3. Patient will identify two emotions related to recovery 4. Patient will demonstrate ability to communicate their needs through discussion and/or role plays   Summary of Patient Progress: Patient was encouraged and invited to attend group. Patient did not attend group. Social worker will continue to encourage group participation in the future.       Therapeutic Modalities:   Cognitive Behavioral Therapy Solution-Focused Therapy Assertiveness Training Relapse Prevention Therapy   [name]  Allyn Bartelson, Alexander MtLCSW 05/30/2018 2:37 PM

## 2018-05-30 NOTE — Anesthesia Postprocedure Evaluation (Signed)
Anesthesia Post Note  Patient: Renee Turner  Procedure(s) Performed: ECT TX  Patient location during evaluation: PACU Anesthesia Type: General Level of consciousness: awake and alert Pain management: pain level controlled Vital Signs Assessment: post-procedure vital signs reviewed and stable Respiratory status: spontaneous breathing, nonlabored ventilation and respiratory function stable Cardiovascular status: blood pressure returned to baseline and stable Postop Assessment: no signs of nausea or vomiting Anesthetic complications: no     Last Vitals:  Vitals:   05/30/18 1448 05/30/18 1516  BP: 110/76 110/76  Pulse: 100 86  Resp:  18  Temp: 36.9 C (!) 36.4 C  SpO2: 100%     Last Pain:  Vitals:   05/30/18 1516  TempSrc: Oral  PainSc: 0-No pain                 Kostantinos Tallman

## 2018-05-30 NOTE — Progress Notes (Signed)
Patient alert and oriented x 4. Ambulates unit with steady gait. Verbally denies SI/HI/SVH and pain. Patient discharged on above date and time. Verbalized understanding the discharge information provided to patient upon discharge. Patient departed unit with discharge paperwork, prescriptions and personal belongings. Picked up by employer to go home and will follow up with [facility] on Monday, July 29 and will have ECT on Monday July 29th. No complaints noted.

## 2018-05-30 NOTE — Progress Notes (Signed)
Recreation Therapy Notes  Date: 05/30/2018  Time: 9:30 am   Location: Craft Room   Behavioral response: N/A   Intervention Topic: Coping Skills  Discussion/Intervention: Patient did not attend group.   Clinical Observations/Feedback:  Patient did not attend group.   Roxas Clymer LRT/CTRS         Tate Jerkins 05/30/2018 10:58 AM 

## 2018-05-30 NOTE — Procedures (Signed)
ECT SERVICES Physician's Interval Evaluation & Treatment Note  Patient Identification: Renee Turner MRN:  [id number] Date of Evaluation:  05/30/2018 TX #: 6  MADRS:   MMSE:   P.E. Findings:  No change physical exam  Psychiatric Interval Note:  Mood seems to be brighter.  Subjective:  Patient is a 22 y.o. female seen for evaluation for Electroconvulsive Therapy. Denies feeling depressed  Treatment Summary:   '[x]'   Right Unilateral             '[]'  Bilateral   % Energy : 0.3 ms 25%   Impedance: 1250 ohms  Seizure Energy Index: 5807 V squared  Postictal Suppression Index: 81%  Seizure Concordance Index: 98%  Medications  Pre Shock: Robinul 0.1 mg Toradol 30 mg esmolol 30 mg labetalol 10 mg Brevital 90 mg succinylcholine 100 mg   Post Shock:    Seizure Duration: About 30 seconds EMG 61 seconds EEG   Comments: Discharged today follow-up with outpatient on Monday  Lungs:  '[x]'   Clear to auscultation               '[]'  Other:   Heart:    '[x]'   Regular rhythm             '[]'  irregular rhythm    '[x]'   Previous H&P reviewed, patient examined and there are NO CHANGES                 '[]'   Previous H&P reviewed, patient examined and there are changes noted.   Alethia Berthold, MD 7/26/201912:10 PM

## 2018-05-30 NOTE — Anesthesia Preprocedure Evaluation (Signed)
Anesthesia Evaluation  Patient identified by MRN, date of birth, ID band Patient awake    Reviewed: Allergy & Precautions, NPO status , Patient's Chart, lab work & pertinent test results  History of Anesthesia Complications Negative for: history of anesthetic complications  Airway Mallampati: III  TM Distance: >3 FB Neck ROM: Full    Dental no notable dental hx.    Pulmonary neg pulmonary ROS, neg sleep apnea, neg COPD,    breath sounds clear to auscultation- rhonchi (-) wheezing      Cardiovascular Exercise Tolerance: Good (-) hypertension(-) CAD, (-) Past MI, (-) Cardiac Stents and (-) CABG  Rhythm:Regular Rate:Normal - Systolic murmurs and - Diastolic murmurs    Neuro/Psych PSYCHIATRIC DISORDERS Anxiety Depression negative neurological ROS     GI/Hepatic negative GI ROS, Neg liver ROS,   Endo/Other  diabetes, Oral Hypoglycemic Agents  Renal/GU negative Renal ROS     Musculoskeletal negative musculoskeletal ROS (+)   Abdominal (+) + obese,   Peds  Hematology negative hematology ROS (+)   Anesthesia Other Findings Past Medical History: No date: Anxiety No date: Depression No date: Diabetes mellitus without complication ([name])     Comment:  Type II No date: Diabetes mellitus, type II ([name])   Reproductive/Obstetrics                             Anesthesia Physical Anesthesia Plan  ASA: II  Anesthesia Plan: General   Post-op Pain Management:    Induction: Intravenous  PONV Risk Score and Plan: 2 and Ondansetron  Airway Management Planned: Mask  Additional Equipment:   Intra-op Plan:   Post-operative Plan:   Informed Consent: I have reviewed the patients History and Physical, chart, labs and discussed the procedure including the risks, benefits and alternatives for the proposed anesthesia with the patient or authorized representative who has indicated his/her understanding  and acceptance.   Dental advisory given  Plan Discussed with: CRNA and Anesthesiologist  Anesthesia Plan Comments:         Anesthesia Quick Evaluation

## 2018-05-30 NOTE — Plan of Care (Signed)
Patient up ad lib with steady gait, alert and oriented x 4. In bed sleeping when this writer came onto the unit. Transported to ECT for procedure this morning. Transported off unit at approximately 0855. No complaints of pain nor discomfort at this time. Denies SI/HI/AVH at this time.

## 2018-05-30 NOTE — Progress Notes (Signed)
  [facility] :  Will you be returning to the same living situation after discharge:  Yes,  Home with family At discharge, do you have transportation home?: Yes,  Patients co-worker will come at discharge Do you have the ability to pay for your medications: Yes,  Insurance  Release of information consent forms completed and in the chart;  Patient's signature needed at discharge.  Patient to Follow up at: Follow-up Information    [facility] [place]. Go on 06/11/2018.   Why:  Please go to your hospital follow up appointment on Wednesday, 06/11/18 at 8:40AM. Thank you! Contact information: [address] [place], [place] [address]  P: [phone]        [facility]. Go on 06/02/2018.   Why:  Please follow up for ECT treatment on Monday June 02, 2018 at 8:30am. Thank you. Contact information: [address] [id number] [place] [id number] [phone]       [facility], [facility] Follow up.   Why:  Please call and reschedule your appointment for therapy. Thank you. Contact information: [address] [place] [address] [phone]           Next level of care provider has access to [facility] Link:yes  Safety Planning and Suicide Prevention discussed: Yes,  Completed with patient  Have you used any form of tobacco in the last 30 days? (Cigarettes, Smokeless Tobacco, Cigars, and/or Pipes): No  Has patient been referred to the Quitline?: N/A patient is not a smoker  Patient has been referred for addiction treatment: N/A  [name]  Fayez Sturgell, LCSW 05/30/2018, 2:28 PM

## 2018-05-30 NOTE — Progress Notes (Signed)
D: Pt denies SI/HI/AVH, affect is flat but brightens upon approach. Patient is pleasant and cooperative, with treatment plan. Patient stated she feels better from doing ECT and she is looking forward to her procedure tomorrow. Patient appears less anxious and she is interacting with peers and staff appropriately.  A: Pt was offered support and encouragement, and was given scheduled medications. In addition patient was encouraged to attend groups. 15 minute checks were done for safety.  R:Pt attends groups and interacts well with peers and staff, he is compliant with medication. .Pt receptive to treatment and safety maintained on unit.

## 2018-05-30 NOTE — Transfer of Care (Signed)
Immediate Anesthesia Transfer of Care Note  Patient: Renee Turner  Procedure(s) Performed: ECT TX  Patient Location: PACU  Anesthesia Type:General  Level of Consciousness: awake and alert   Airway & Oxygen Therapy: Patient Spontanous Breathing and Patient connected to face mask oxygen  Post-op Assessment: Report given to RN and Post -op Vital signs reviewed and stable  Post vital signs: Reviewed and stable  Last Vitals:  Vitals Value Taken Time  BP    Temp    Pulse 79 05/30/2018 12:27 PM  Resp 15 05/30/2018 12:27 PM  SpO2 100 % 05/30/2018 12:27 PM  Vitals shown include unvalidated device data.  Last Pain:  Vitals:   05/30/18 0933  TempSrc:   PainSc: 0-No pain         Complications: No apparent anesthesia complications

## 2018-05-30 NOTE — Progress Notes (Signed)
Recreation Therapy Notes  INPATIENT RECREATION TR PLAN  Patient Details Name: Renee Turner MRN: [id number] DOB: 1996/05/22 Today's Date: 05/30/2018  Rec Therapy Plan Is patient appropriate for Therapeutic Recreation?: Yes Treatment times per week: at least 3 Estimated Length of Stay: 5-7 days TR Treatment/Interventions: Group participation (Comment)  Discharge Criteria Pt will be discharged from therapy if:: Discharged Treatment plan/goals/alternatives discussed and agreed upon by:: Patient/family  Discharge Summary Short term goals set: Patient will identify 3 triggers for depression within 5 recreation therapy group sessions Short term goals met: Not met Reason goals not met: Patient stay in her room most  of her stay here Therapeutic equipment acquired: N/A Reason patient discharged from therapy: Discharge from hospital Pt/family agrees with progress & goals achieved: Yes Date patient discharged from therapy: 06/06/18   Nancee Brownrigg 05/30/2018, 4:06 PM

## 2018-05-30 NOTE — H&P (Signed)
Renee Turner is an 22 y.o. female.   Chief Complaint: Patient states she is feeling okay.  Has no specific complaint today.  No physical complaint no suicidal ideation no psychosis. HPI: History of long-standing depression and PTSD showing some benefit from ECT treatment now preparing for discharge.  Past Medical History:  Diagnosis Date  . Anxiety   . Depression   . Diabetes mellitus without complication ([place])    Type II  . Diabetes mellitus, type II ([place])     History reviewed. No pertinent surgical history.  History reviewed. No pertinent family history. Social History:  reports that she has never smoked. She has never used smokeless tobacco. She reports that she drinks alcohol. She reports that she does not use drugs.  Allergies: No Known Allergies  Medications Prior to Admission  Medication Sig Dispense Refill  . ferrous sulfate 325 (65 FE) MG tablet Take 1 tablet (325 mg total) by mouth daily with breakfast. For iron deficiency anemia 30 tablet 0  . FLUoxetine (PROZAC) 40 MG capsule Take 1 capsule (40 mg total) by mouth daily. For depression 1 capsule 0  . gabapentin (NEURONTIN) 300 MG capsule Take 1 capsule (300 mg total) by mouth 3 (three) times daily. For pain 90 capsule 0  . hydrOXYzine (ATARAX/VISTARIL) 25 MG tablet Take 1 tablet (25 mg total) by mouth 3 (three) times daily as needed for anxiety. 30 tablet 0  . metformin (FORTAMET) 1000 MG (OSM) 24 hr tablet Take 1 tablet (1,000 mg total) by mouth 2 (two) times daily with a meal. For DM 60 tablet 0  . protriptyline (VIVACTIL) 10 MG tablet Take 1 tablet (10 mg total) by mouth 3 (three) times daily. Narcolepsy 90 tablet 0  . traZODone (DESYREL) 50 MG tablet Take 1 tablet (50 mg total) by mouth at bedtime. Take 2 hours before bedtime: For sleep 1 tablet 0  . traZODone (DESYREL) 50 MG tablet Take 1 tablet (50 mg total) by mouth at bedtime as needed for sleep. 30 tablet 0  . exenatide (BYETTA) 5 MCG/0.02ML SOPN injection Inject 0.02  mLs (5 mcg total) into the skin 2 (two) times daily with a meal. For DM 1.2 mL 0    Results for orders placed or performed during the hospital encounter of 05/14/18 (from the past 48 hour(s))  Glucose, capillary     Status: Abnormal   Collection Time: 05/28/18 11:41 AM  Result Value Ref Range   Glucose-Capillary 108 (H) 70 - 99 mg/dL  Glucose, capillary     Status: Abnormal   Collection Time: 05/29/18  7:13 AM  Result Value Ref Range   Glucose-Capillary 101 (H) 70 - 99 mg/dL   Comment 1 Notify RN   Glucose, capillary     Status: Abnormal   Collection Time: 05/30/18  7:23 AM  Result Value Ref Range   Glucose-Capillary 102 (H) 70 - 99 mg/dL   Comment 1 Notify RN    No results found.  Review of Systems  Constitutional: Negative.   HENT: Negative.   Eyes: Negative.   Respiratory: Negative.   Cardiovascular: Negative.   Gastrointestinal: Negative.   Musculoskeletal: Negative.   Skin: Negative.   Neurological: Negative.   Psychiatric/Behavioral: Negative.     Blood pressure 117/83, pulse 81, temperature 97.8 F (36.6 C), temperature source Oral, resp. rate 18, height _0  (1.676 m), weight 207 lb (93.9 kg), SpO2 100 %. Physical Exam  Nursing note and vitals reviewed. Constitutional: She appears well-developed and well-nourished.  HENT:  Head: Normocephalic and atraumatic.  Eyes: Pupils are equal, round, and reactive to light. Conjunctivae are normal.  Neck: Normal range of motion.  Cardiovascular: Regular rhythm and normal heart sounds.  Respiratory: Effort normal.  GI: Soft.  Musculoskeletal: Normal range of motion.  Neurological: She is alert.  Skin: Skin is warm and dry.  Psychiatric: Judgment normal. Her affect is blunt. Her speech is delayed. She is slowed. Thought content is not paranoid. Cognition and memory are normal. She expresses no homicidal and no suicidal ideation.     Assessment/Plan Patient seems to have shown some improvement no longer requires  inpatient treatment.  Plan is for discharge from the hospital with follow-up ECT on Monday.  Alethia Berthold, MD 05/30/2018, 9:49 AM
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
## 2018-06-01 ENCOUNTER — Other Ambulatory Visit: Payer: Self-pay | Admitting: Psychiatry

## 2018-06-02 ENCOUNTER — Encounter: Payer: Self-pay | Admitting: Anesthesiology

## 2018-06-02 ENCOUNTER — Encounter
Admission: RE | Admit: 2018-06-02 | Discharge: 2018-06-02 | Disposition: A | Discharge status: Home / Self Care | Payer: BLUE CROSS/BLUE SHIELD | Source: Ambulatory Visit | Attending: Psychiatry | Admitting: Psychiatry

## 2018-06-02 ENCOUNTER — Inpatient Hospital Stay
Admission: AD | Admit: 2018-06-02 | Discharge: 2018-06-09 | DRG: 885 | Disposition: A | Discharge status: Home / Self Care | Payer: BLUE CROSS/BLUE SHIELD | Source: Intra-hospital | Attending: Psychiatry | Admitting: Psychiatry

## 2018-06-02 ENCOUNTER — Other Ambulatory Visit: Payer: Self-pay

## 2018-06-02 DIAGNOSIS — R45851 Suicidal ideations: Secondary | ICD-10-CM | POA: Diagnosis present

## 2018-06-02 DIAGNOSIS — Z7984 Long term (current) use of oral hypoglycemic drugs: Secondary | ICD-10-CM | POA: Diagnosis not present

## 2018-06-02 DIAGNOSIS — E119 Type 2 diabetes mellitus without complications: Secondary | ICD-10-CM | POA: Insufficient documentation

## 2018-06-02 DIAGNOSIS — E1165 Type 2 diabetes mellitus with hyperglycemia: Secondary | ICD-10-CM | POA: Diagnosis present

## 2018-06-02 DIAGNOSIS — F332 Major depressive disorder, recurrent severe without psychotic features: Principal | ICD-10-CM | POA: Diagnosis present

## 2018-06-02 DIAGNOSIS — Z818 Family history of other mental and behavioral disorders: Secondary | ICD-10-CM

## 2018-06-02 DIAGNOSIS — F4310 Post-traumatic stress disorder, unspecified: Secondary | ICD-10-CM | POA: Diagnosis present

## 2018-06-02 DIAGNOSIS — F339 Major depressive disorder, recurrent, unspecified: Secondary | ICD-10-CM | POA: Diagnosis present

## 2018-06-02 DIAGNOSIS — E1140 Type 2 diabetes mellitus with diabetic neuropathy, unspecified: Secondary | ICD-10-CM | POA: Diagnosis present

## 2018-06-02 LAB — GLUCOSE, CAPILLARY
GLUCOSE-CAPILLARY: 124 mg/dL — AB (ref 70–99)
GLUCOSE-CAPILLARY: 108 mg/dL — AB (ref 70–99)

## 2018-06-02 LAB — POCT PREGNANCY, URINE: PREG TEST UR: NEGATIVE

## 2018-06-02 MED ORDER — SUCCINYLCHOLINE CHLORIDE 200 MG/10ML IV SOSY
PREFILLED_SYRINGE | INTRAVENOUS | Status: DC | PRN
  Administered 2018-06-02: 100 mg via INTRAVENOUS

## 2018-06-02 MED ORDER — ESMOLOL HCL 100 MG/10ML IV SOLN
INTRAVENOUS | Status: AC
  Filled 2018-06-02: qty 10

## 2018-06-02 MED ORDER — GLYCOPYRROLATE 0.2 MG/ML IJ SOLN
INTRAMUSCULAR | Status: AC
  Administered 2018-06-02: 0.1 mg via INTRAVENOUS
  Filled 2018-06-02: qty 1

## 2018-06-02 MED ORDER — KETOROLAC TROMETHAMINE 30 MG/ML IJ SOLN
INTRAMUSCULAR | Status: AC
Start: 2018-06-02 — End: 2018-06-02
  Administered 2018-06-02: 30 mg via INTRAVENOUS
  Filled 2018-06-02: qty 1

## 2018-06-02 MED ORDER — LABETALOL HCL 5 MG/ML IV SOLN
INTRAVENOUS | Status: DC | PRN
  Administered 2018-06-02: 10 mg via INTRAVENOUS

## 2018-06-02 MED ORDER — LABETALOL HCL 5 MG/ML IV SOLN
INTRAVENOUS | Status: AC
Start: 2018-06-02 — End: ?
  Filled 2018-06-02: qty 4

## 2018-06-02 MED ORDER — SUCCINYLCHOLINE CHLORIDE 20 MG/ML IJ SOLN
INTRAMUSCULAR | Status: AC
  Filled 2018-06-02: qty 1

## 2018-06-02 MED ORDER — GABAPENTIN 300 MG PO CAPS
300.0000 mg | ORAL_CAPSULE | Freq: Three times a day (TID) | ORAL | Status: DC
  Administered 2018-06-02 – 2018-06-09 (×19): 300 mg via ORAL
  Filled 2018-06-02 (×19): qty 1

## 2018-06-02 MED ORDER — PROTRIPTYLINE HCL 10 MG PO TABS: 10.0000 mg | ORAL_TABLET | Freq: Three times a day (TID) | ORAL | Status: DC

## 2018-06-02 MED ORDER — METFORMIN HCL ER 500 MG PO TB24
1000.0000 mg | ORAL_TABLET | Freq: Two times a day (BID) | ORAL | Status: DC
  Administered 2018-06-02 – 2018-06-09 (×12): 1000 mg via ORAL
  Filled 2018-06-02 (×15): qty 2

## 2018-06-02 MED ORDER — ACETAMINOPHEN 325 MG PO TABS
650.0000 mg | ORAL_TABLET | Freq: Four times a day (QID) | ORAL | Status: DC | PRN
  Filled 2018-06-02: qty 2

## 2018-06-02 MED ORDER — SODIUM CHLORIDE 0.9 % IV SOLN
INTRAVENOUS | Status: DC | PRN
  Administered 2018-06-02: 10:00:00 via INTRAVENOUS

## 2018-06-02 MED ORDER — METHOHEXITAL SODIUM 100 MG/10ML IV SOSY
PREFILLED_SYRINGE | INTRAVENOUS | Status: DC | PRN
  Administered 2018-06-02: 90 mg via INTRAVENOUS

## 2018-06-02 MED ORDER — TRAZODONE HCL 50 MG PO TABS
50.0000 mg | ORAL_TABLET | Freq: Every evening | ORAL | Status: DC | PRN
  Filled 2018-06-02: qty 1

## 2018-06-02 MED ORDER — ONDANSETRON HCL 4 MG/2ML IJ SOLN: 4.0000 mg | Freq: Once | INTRAMUSCULAR | Status: DC | PRN

## 2018-06-02 MED ORDER — GLYCOPYRROLATE 0.2 MG/ML IJ SOLN
0.1000 mg | Freq: Once | INTRAMUSCULAR | Status: AC
  Administered 2018-06-02: 0.1 mg via INTRAVENOUS

## 2018-06-02 MED ORDER — TRAZODONE HCL 50 MG PO TABS
50.0000 mg | ORAL_TABLET | Freq: Every day | ORAL | Status: DC
  Administered 2018-06-02 – 2018-06-08 (×7): 50 mg via ORAL
  Filled 2018-06-02 (×7): qty 1

## 2018-06-02 MED ORDER — METHOHEXITAL SODIUM 0.5 G IJ SOLR
INTRAMUSCULAR | Status: AC
  Filled 2018-06-02: qty 500

## 2018-06-02 MED ORDER — MAGNESIUM HYDROXIDE 400 MG/5ML PO SUSP
30.0000 mL | Freq: Every day | ORAL | Status: DC | PRN
  Administered 2018-06-04 – 2018-06-05 (×3): 30 mL via ORAL
  Filled 2018-06-02 (×3): qty 30

## 2018-06-02 MED ORDER — FERROUS SULFATE 325 (65 FE) MG PO TABS
325.0000 mg | ORAL_TABLET | Freq: Every day | ORAL | Status: DC
Start: 2018-06-03 — End: 2018-06-09
  Administered 2018-06-03 – 2018-06-09 (×7): 325 mg via ORAL
  Filled 2018-06-02 (×7): qty 1

## 2018-06-02 MED ORDER — KETOROLAC TROMETHAMINE 30 MG/ML IJ SOLN
30.0000 mg | Freq: Once | INTRAMUSCULAR | Status: AC
  Administered 2018-06-02: 30 mg via INTRAVENOUS

## 2018-06-02 MED ORDER — ESMOLOL HCL 100 MG/10ML IV SOLN
INTRAVENOUS | Status: DC | PRN
  Administered 2018-06-02: 30 mg via INTRAVENOUS

## 2018-06-02 MED ORDER — FLUOXETINE HCL 20 MG PO CAPS
40.0000 mg | ORAL_CAPSULE | Freq: Every day | ORAL | Status: DC
  Administered 2018-06-02 – 2018-06-09 (×8): 40 mg via ORAL
  Filled 2018-06-02 (×8): qty 2

## 2018-06-02 MED ORDER — SODIUM CHLORIDE 0.9 % IV SOLN
500.0000 mL | Freq: Once | INTRAVENOUS | Status: AC
  Administered 2018-06-02: 500 mL via INTRAVENOUS

## 2018-06-02 MED ORDER — ALUM & MAG HYDROXIDE-SIMETH 200-200-20 MG/5ML PO SUSP: 30.0000 mL | ORAL | Status: DC | PRN

## 2018-06-02 NOTE — Anesthesia Postprocedure Evaluation (Signed)
Anesthesia Post Note  Patient: Renee Turner  Procedure(s) Performed: ECT TX  Patient location during evaluation: PACU Anesthesia Type: General Level of consciousness: sedated Pain management: pain level controlled Vital Signs Assessment: post-procedure vital signs reviewed and stable Respiratory status: spontaneous breathing and respiratory function stable Cardiovascular status: stable Anesthetic complications: no     Last Vitals:  Vitals:   06/02/18 1105 06/02/18 1120  BP: 106/75 111/79  Pulse: 86 85  Resp: 17 (!) 21  Temp: (!) 36.4 C   SpO2: 100% 100%    Last Pain:  Vitals:   06/02/18 1120  TempSrc:   PainSc: 0-No pain                 Kerrie Latour K

## 2018-06-02 NOTE — BHH Suicide Risk Assessment (Signed)
[facility]   Nursing information obtained from:  Patient Demographic factors:  Unemployed Current Mental Status:  Self-harm thoughts Loss Factors:  NA Historical Factors:  Prior suicide attempts Risk Reduction Factors:  Living with another person, especially a relative  Total Time spent with patient: 1 hour Principal Problem: Major depression severe recurrent without psychotic features Diagnosis:   Patient Active Problem List   Diagnosis Date Noted  . Severe recurrent major depression without psychotic features ([name]) [F33.2] 05/14/2018  . MDD (major depressive disorder), recurrent severe, without psychosis ([name]) [F33.2] 05/06/2018  . 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 Posttraumatic stress disorder [F43.10] 11/13/2016  . Severe episode of recurrent major depressive disorder, without psychotic features ([name]) [F33.2] 11/07/2016  . Diabetes ([name]) [E11.9] 11/07/2016  . Diabetic neuropathy associated with type 2 diabetes mellitus ([name]) [E11.40] 07/19/2016  . Obesity (BMI 30.0-34.9) [E66.9] 07/19/2016  . Type 2 diabetes mellitus with hyperglycemia, without long-term current use of insulin ([name]) [E11.65] 07/19/2016   Subjective Data: Patient admitted today from ECT treatment.  Admits to having continued suicidal thoughts.  Depressed mood.  Hopelessness.  Very withdrawn negative and appears cognitively impaired.  Continued Clinical Symptoms:  Alcohol Use Disorder Identification Test Final Score (AUDIT): 1 The "Alcohol Use Disorders Identification Test", Guidelines for Use in Primary Care, Second Edition.  World Science writerHealth Organization [facility](WHO). Score between 0-7:  no or low risk or alcohol related problems. Score between 8-15:  moderate risk of alcohol related problems. Score between 16-19:  high risk of alcohol related problems. Score 20 or above:  warrants further diagnostic evaluation for alcohol dependence and treatment.   CLINICAL FACTORS:   Depression:   Anhedonia   Musculoskeletal: Strength & Muscle Tone: within normal limits Gait & Station: normal Patient leans: N/A  Psychiatric Specialty Exam: Physical Exam  ROS  Blood pressure 106/76, pulse 78, temperature 97.8 F (36.6 C), temperature source Oral, resp. rate 18, height 5\' 6"  (1.676 m), weight 90.3 kg (199 lb), last menstrual period 05/29/2018, SpO2 100 %.Body mass index is 32.12 kg/m.  General Appearance: Casual  Eye Contact:  Minimal  Speech:  Slow  Volume:  Decreased  Mood:  Depressed  Affect:  Congruent  Thought Process:  NA  Orientation:  Negative  Thought Content:  Negative  Suicidal Thoughts:  Yes.  with intent/plan  Homicidal Thoughts:  No  Memory:  Immediate;   Fair Recent;   Fair Remote;   Fair  Judgement:  Impaired  Insight:  Shallow  Psychomotor Activity:  Decreased  Concentration:  Concentration: Poor  Recall:  Poor  Fund of Knowledge:  Poor  Language:  Poor  Akathisia:  Negative  Handed:  Right  AIMS (if indicated):     Assets:  Physical Health  ADL's:  Impaired  Cognition:  Impaired,  Mild  Sleep:         COGNITIVE FEATURES THAT CONTRIBUTE TO RISK:  Loss of executive function    SUICIDE RISK:   Moderate:  Frequent suicidal ideation with limited intensity, and duration, some specificity in terms of plans, no associated intent, good self-control, limited dysphoria/symptomatology, some risk factors present, and identifiable protective factors, including available and accessible social support.  PLAN OF CARE: Patient was admitted to the psychiatric unit again and is on 15-minute checks.  She will be continued on current medication  although this will be reassessed as appropriate.  Continue with possible ECT plan although I am not sure if it is really helping.  Patient states that  she would feel more comfortable talking to a woman doctor and we will look into possible reassignment  I certify that inpatient services furnished can  reasonably be expected to improve the patient's condition.   Mordecai Rasmussen, MD 06/02/2018, 5:29 PM

## 2018-06-02 NOTE — Procedures (Signed)
ECT SERVICES Physician's Interval Evaluation & Treatment Note  Patient Identification: Renee Turner MRN:  [id number] Date of Evaluation:  06/02/2018 TX #: [id number]  MADRS: 19  MMSE: 30  P.E. Findings:  No change to physical exam  Psychiatric Interval Note:  Very negative very tired very withdrawn suicidal thoughts.  Unable to answer many specific questions  Subjective:  Patient is a 22 y.o. female seen for evaluation for Electroconvulsive Therapy. Major depression seems worse than before  Treatment Summary:   '[x]'   Right Unilateral             '[]'  Bilateral   % Energy : 0.3 ms 25%   Impedance: 1110 ohms  Seizure Energy Index: 13,331 V squared  Postictal Suppression Index: 92%  Seizure Concordance Index: 98%  Medications  Pre Shock: [place] 0.1 mg Toradol 30 mg esmolol 30 mg labetalol 10 mg Brevital 90 mg succinylcholine 100 mg  Post Shock:    Seizure Duration: 29 seconds by EMG 70 seconds by EEG   Comments: I think it is important that we readmit her to the hospital and we will continue with 3 times a week treatment  Lungs:  '[x]'   Clear to auscultation               '[]'  Other:   Heart:    '[x]'   Regular rhythm             '[]'  irregular rhythm    '[x]'   Previous H&P reviewed, patient examined and there are NO CHANGES                 '[]'   Previous H&P reviewed, patient examined and there are changes noted.   Alethia Berthold, MD 7/29/201910:55 AM

## 2018-06-02 NOTE — BH Assessment (Signed)
[facility].  Accepting physician is Dr. Toni Amendlapacs.  Attending Physician will be Dr. Toni Amendlapacs.  Patient [facility] to room 302, [facility] T'[facility].  Call report to [phone].  Representative/Transfer Coordinator is Mayer Maskeramille H (TTS) [facility] Ethelene Browns([place])

## 2018-06-02 NOTE — Progress Notes (Signed)
Patient appears depressed with a flat affect  during admission assessment.Speech is soft and delay in response noted. Patient verbalized suicidal thoughts,denies any plan  at this time. Patient denies AVH. Patient informed of fall risk status, fall risk assessed "low" at this time. Patient oriented to unit/staff/room. Patient denies any questions/concerns at this time. Patient safe on unit with Q15 minute checks for safety. Skin assessment and body search done,no contraband found.Lunch given to patient.

## 2018-06-02 NOTE — Progress Notes (Signed)
This nurse spoke with the mother and stated the visiting hours are Monday-Friday from 6:00pm-8:30pm and on Saturday and Sunday the visiting hours are from 4:00pm-8:30pm. When the mother asked for a number to ask for updates on her daughter this nurse let her know that Renee Turner does not wish to accept calls at this point in time. The mother verbalized understanding.

## 2018-06-02 NOTE — H&P (Signed)
Renee Turner is an 22 y.o. female.   Chief Complaint: Patient feeling depressed having suicidal thoughts very anxious very negative very tired withdrawn. HPI: Severe depression and had been discharged on Friday but comes back looking to me even worse than when she left  Past Medical History:  Diagnosis Date  . Anxiety   . Depression   . Diabetes mellitus without complication ([place])    Type II  . Diabetes mellitus, type II ([place])     History reviewed. No pertinent surgical history.  History reviewed. No pertinent family history. Social History:  reports that she has never smoked. She has never used smokeless tobacco. She reports that she drinks alcohol. She reports that she does not use drugs.  Allergies: No Known Allergies   (Not in a hospital admission)  Results for orders placed or performed during the hospital encounter of 06/02/18 (from the past 48 hour(s))  Pregnancy, urine POC     Status: None   Collection Time: 06/02/18  9:09 AM  Result Value Ref Range   Preg Test, Ur NEGATIVE NEGATIVE    Comment:        THE SENSITIVITY OF THIS METHODOLOGY IS >24 mIU/mL   Glucose, capillary     Status: Abnormal   Collection Time: 06/02/18  9:16 AM  Result Value Ref Range   Glucose-Capillary 124 (H) 70 - 99 mg/dL   No results found.  Review of Systems  Constitutional: Negative.   HENT: Negative.   Eyes: Negative.   Respiratory: Negative.   Cardiovascular: Negative.   Gastrointestinal: Negative.   Musculoskeletal: Negative.   Skin: Negative.   Neurological: Negative.   Psychiatric/Behavioral: Positive for depression and suicidal ideas. Negative for hallucinations, memory loss and substance abuse. The patient is nervous/anxious. The patient does not have insomnia.     Blood pressure 106/78, pulse 88, temperature 98.9 F (37.2 C), temperature source Oral, resp. rate 18, height '5\' 6"'  (1.676 m), weight 93.4 kg (206 lb), last menstrual period 05/29/2018, SpO2 100 %. Physical Exam   Nursing note and vitals reviewed. Constitutional: She appears well-developed and well-nourished.  HENT:  Head: Normocephalic and atraumatic.  Eyes: Pupils are equal, round, and reactive to light. Conjunctivae are normal.  Neck: Normal range of motion.  Cardiovascular: Regular rhythm and normal heart sounds.  Respiratory: Effort normal. No respiratory distress.  GI: Soft.  Musculoskeletal: Normal range of motion.  Neurological: She is alert.  Skin: Skin is warm and dry.  Psychiatric: Judgment normal. Her mood appears anxious. Her affect is blunt. Her speech is delayed. She is slowed. Cognition and memory are impaired. She expresses suicidal ideation. She expresses no suicidal plans.     Assessment/Plan We are planning to readmit her to the psychiatry ward downstairs and continue 3 times a week treatment along with medication management therapy etc.  Alethia Berthold, MD 06/02/2018, 10:42 AM

## 2018-06-02 NOTE — Anesthesia Post-op Follow-up Note (Signed)
Anesthesia QCDR form completed.        

## 2018-06-02 NOTE — H&P (Signed)
Psychiatric Admission Assessment Adult  Patient Identification: Renee Turner MRN:  [id number] Date of Evaluation:  06/02/2018 Chief Complaint:  major depressive disorder Principal Diagnosis: Major depression severe recurrent without Diagnosis:   Patient Active Problem List   Diagnosis Date Noted  . Severe recurrent major depression without psychotic features ([place]) [F33.2] 05/14/2018  . MDD (major depressive disorder), recurrent severe, without psychosis ([place]) [F33.2] 05/06/2018  . 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 Posttraumatic stress disorder [F43.10] 11/13/2016  . Severe episode of recurrent major depressive disorder, without psychotic features ([place]) [F33.2] 11/07/2016  . Diabetes ([place]) [E11.9] 11/07/2016  . Diabetic neuropathy associated with type 2 diabetes mellitus ([place]) [E11.40] 07/19/2016  . Obesity (BMI 30.0-34.9) [E66.9] 07/19/2016  . Type 2 diabetes mellitus with hyperglycemia, without long-term current use of insulin ([place]) [E11.65] 07/19/2016   History of Present Illness: Patient with a history of major depression who is receiving ECT.  She was discharged from the hospital on Friday.  Return today for ECT treatment and revealed that she had taken none of her medicine since discharge.  She continued to have suicidal thoughts frequently.  Not leaving her room not doing any activity not making any effort to get outpatient treatment.  Patient appears to be withdrawn and cognitively impaired.  Not able to offer any reassurance about safety outside the hospital.  Based on all of that I felt the safest thing was to readmit her. Associated Signs/Symptoms: Depression Symptoms:  depressed mood, anhedonia, suicidal thoughts with specific plan, (Hypo) Manic Symptoms:  None Anxiety Symptoms:  Excessive Worry, Psychotic Symptoms:  None that we know of PTSD Symptoms: Had a traumatic exposure:  Patient has reported having had a history of trauma although the details are very  unclear as she is unwilling to talk about it with me Total Time spent with patient: 1 hour  Past Psychiatric History: Patient has a history of recurrent depression and PTSD with past suicidality and poor response to medicine  Is the patient at risk to self? Yes.    Has the patient been a risk to self in the past 6 months? Yes.    Has the patient been a risk to self within the distant past? Yes.    Is the patient a risk to others? No.  Has the patient been a risk to others in the past 6 months? No.  Has the patient been a risk to others within the distant past? No.   Prior Inpatient Therapy:   Prior Outpatient Therapy:    Alcohol Screening: 1. How often do you have a drink containing alcohol?: Monthly or less 2. How many drinks containing alcohol do you have on a typical day when you are drinking?: 1 or 2 3. How often do you have six or more drinks on one occasion?: Never AUDIT-C Score: 1 4. How often during the last year have you found that you were not able to stop drinking once you had started?: Never 5. How often during the last year have you failed to do what was normally expected from you becasue of drinking?: Never 6. How often during the last year have you needed a first drink in the morning to get yourself going after a heavy drinking session?: Never 7. How often during the last year have you had a feeling of guilt of remorse after drinking?: Never 8. How often during the last year have you been unable to remember what happened the night before because you had been drinking?: Never 9. Have you  or someone else been injured as a result of your drinking?: No 10. Has a relative or friend or a doctor or another health worker been concerned about your drinking or suggested you cut down?: No Alcohol Use Disorder Identification Test Final Score (AUDIT): 1 Intervention/Follow-up: AUDIT Score <7 follow-up not indicated Substance Abuse History in the last 12 months:  No. Consequences of  Substance Abuse: Negative Previous Psychotropic Medications: Yes  Psychological Evaluations: Yes  Past Medical History:  Past Medical History:  Diagnosis Date  . Anxiety   . Depression   . Diabetes mellitus without complication ([place])    Type II  . Diabetes mellitus, type II ([place])    History reviewed. No pertinent surgical history. Family History: History reviewed. No pertinent family history. Family Psychiatric  History: Does not know of any Tobacco Screening: Have you used any form of tobacco in the last 30 days? (Cigarettes, Smokeless Tobacco, Cigars, and/or Pipes): No Social History:  Social History   Substance and Sexual Activity  Alcohol Use Yes     Social History   Substance and Sexual Activity  Drug Use No    Additional Social History:                           Allergies:  No Known Allergies Lab Results:  Results for orders placed or performed during the hospital encounter of 06/02/18 (from the past 48 hour(s))  Pregnancy, urine POC     Status: None   Collection Time: 06/02/18  9:09 AM  Result Value Ref Range   Preg Test, Ur NEGATIVE NEGATIVE    Comment:        THE SENSITIVITY OF THIS METHODOLOGY IS >24 mIU/mL   Glucose, capillary     Status: Abnormal   Collection Time: 06/02/18  9:16 AM  Result Value Ref Range   Glucose-Capillary 124 (H) 70 - 99 mg/dL  Glucose, capillary     Status: Abnormal   Collection Time: 06/02/18 11:10 AM  Result Value Ref Range   Glucose-Capillary 108 (H) 70 - 99 mg/dL    Blood Alcohol level:  Lab Results  Component Value Date   ETH <5 19/41/7408    Metabolic Disorder Labs:  Lab Results  Component Value Date   HGBA1C 6.5 (H) 05/07/2018   MPG 139.85 05/07/2018   No results found for: PROLACTIN Lab Results  Component Value Date   CHOL 181 05/07/2018   TRIG 92 05/07/2018   HDL 46 05/07/2018   CHOLHDL 3.9 05/07/2018   VLDL 18 05/07/2018   LDLCALC 117 (H) 05/07/2018    Current Medications: Current  Facility-Administered Medications  Medication Dose Route Frequency Provider Last Rate Last Dose  . acetaminophen (TYLENOL) tablet 650 mg  650 mg Oral Q6H PRN Daryus Sowash T, MD      . alum & mag hydroxide-simeth (MAALOX/MYLANTA) 200-200-20 MG/5ML suspension 30 mL  30 mL Oral Q4H PRN Sigfredo Schreier T, MD      . Derrill Memo ON 06/03/2018] ferrous sulfate tablet 325 mg  325 mg Oral Q breakfast Angelamarie Avakian T, MD      . FLUoxetine (PROZAC) capsule 40 mg  40 mg Oral Daily Tamel Abel, Madie [place], MD   40 mg at 06/02/18 1312  . gabapentin (NEURONTIN) capsule 300 mg  300 mg Oral TID Barnaby Rippeon, Madie Reno, MD   300 mg at 06/02/18 1712  . magnesium hydroxide (MILK OF MAGNESIA) suspension 30 mL  30 mL Oral Daily PRN Yuleidy Rappleye  T, MD      . metFORMIN (GLUCOPHAGE-XR) 24 hr tablet 1,000 mg  1,000 mg Oral BID WC Alexie Lanni T, MD   1,000 mg at 06/02/18 1713  . protriptyline (VIVACTIL) tablet 10 mg  10 mg Oral TID Hamdi Kley T, MD      . traZODone (DESYREL) tablet 50 mg  50 mg Oral QHS Arissa Fagin T, MD      . traZODone (DESYREL) tablet 50 mg  50 mg Oral QHS PRN Jeniel Slauson, Madie Reno, MD       PTA Medications: Medications Prior to Admission  Medication Sig Dispense Refill Last Dose  . ferrous sulfate 325 (65 FE) MG tablet Take 1 tablet (325 mg total) by mouth daily with breakfast. (Patient not taking: Reported on 06/02/2018) 30 tablet 0 Not Taking at Unknown time  . FLUoxetine (PROZAC) 40 MG capsule Take 1 capsule (40 mg total) by mouth daily. (Patient not taking: Reported on 06/02/2018) 30 capsule 0 Not Taking at Unknown time  . gabapentin (NEURONTIN) 300 MG capsule Take 1 capsule (300 mg total) by mouth 3 (three) times daily. (Patient not taking: Reported on 06/02/2018) 90 capsule 0 Not Taking at Unknown time  . metFORMIN (FORTAMET) 1000 MG (OSM) 24 hr tablet Take 1 tablet (1,000 mg total) by mouth 2 (two) times daily with a meal. (Patient not taking: Reported on 06/02/2018) 60 tablet 0 Not Taking at Unknown time  .  protriptyline (VIVACTIL) 10 MG tablet Take 1 tablet (10 mg total) by mouth 3 (three) times daily. Narcolepsy (Patient not taking: Reported on 06/02/2018) 90 tablet 0 Not Taking at Unknown time  . traZODone (DESYREL) 50 MG tablet Take 1 tablet (50 mg total) by mouth at bedtime. Take 2 hours before bedtime: For sleep (Patient not taking: Reported on 06/02/2018) 30 tablet 0 Not Taking at Unknown time  . traZODone (DESYREL) 50 MG tablet Take 1 tablet (50 mg total) by mouth at bedtime as needed for sleep. (Patient not taking: Reported on 06/02/2018) 30 tablet 0 Not Taking at Unknown time    Musculoskeletal: Strength & Muscle Tone: within normal limits Gait & Station: normal Patient leans: N/A  Psychiatric Specialty Exam: Physical Exam  Nursing note and vitals reviewed. Constitutional: She appears well-developed and well-nourished.  HENT:  Head: Normocephalic and atraumatic.  Eyes: Pupils are equal, round, and reactive to light. Conjunctivae are normal.  Neck: Normal range of motion.  Cardiovascular: Regular rhythm and normal heart sounds.  Respiratory: Effort normal. No respiratory distress.  GI: Soft.  Musculoskeletal: Normal range of motion.  Neurological: She is alert.  Skin: Skin is warm and dry.  Psychiatric: Her affect is blunt. Her speech is delayed. She is slowed and withdrawn. Cognition and memory are impaired. She expresses inappropriate judgment. She expresses suicidal ideation. She expresses suicidal plans.    Review of Systems  Constitutional: Negative.   HENT: Negative.   Eyes: Negative.   Respiratory: Negative.   Cardiovascular: Negative.   Gastrointestinal: Negative.   Musculoskeletal: Negative.   Skin: Negative.   Neurological: Negative.   Psychiatric/Behavioral: Positive for depression and suicidal ideas. Negative for hallucinations, memory loss and substance abuse. The patient is nervous/anxious and has insomnia.     Blood pressure 106/76, pulse 78, temperature 97.8  F (36.6 C), temperature source Oral, resp. rate 18, height '5\' 6"'  (1.676 m), weight 90.3 kg (199 lb), last menstrual period 05/29/2018, SpO2 100 %.Body mass index is 32.12 kg/m.  General Appearance: Fairly Groomed  Eye Contact:  Minimal  Speech:  Slow  Volume:  Decreased  Mood:  Depressed  Affect:  Constricted  Thought Process:  Disorganized  Orientation:  Full (Time, Place, and Person)  Thought Content:  Logical  Suicidal Thoughts:  Yes.  with intent/plan  Homicidal Thoughts:  No  Memory:  Immediate;   Fair Recent;   Poor Remote;   Poor  Judgement:  Impaired  Insight:  Shallow  Psychomotor Activity:  Decreased  Concentration:  Concentration: Poor  Recall:  Poor  Fund of Knowledge:  Poor  Language:  Poor  Akathisia:  No  Handed:  Right  AIMS (if indicated):     Assets:  Housing Physical Health  ADL's:  Impaired  Cognition:  Impaired,  Mild  Sleep:       Treatment Plan Summary: Daily contact with patient to assess and evaluate symptoms and progress in treatment, Medication management and Plan Patient had been receiving ECT but it remains unclear whether this is been of any benefit.  Patient remains extraordinarily difficult to assess because of her inability to communicate clearly.  I suggested to her that seeing a female doctor may help her to be more expressive and she is agreeable to the plan.  I will review this with other physicians tomorrow.  For now continue current medication management  Observation Level/Precautions:  15 minute checks  Laboratory:  Chemistry Profile  Psychotherapy:    Medications:    Consultations:    Discharge Concerns:    Estimated LOS:  Other:     Physician Treatment Plan for Primary Diagnosis: <principal problem not specified> Long Term Goal(s): Improvement in symptoms so as ready for discharge  Short Term Goals: Ability to verbalize feelings will improve and Ability to disclose and discuss suicidal ideas  Physician Treatment Plan for  Secondary Diagnosis: Active Problems:   Severe recurrent major depression without psychotic features ([place])  Long Term Goal(s): Improvement in symptoms so as ready for discharge  Short Term Goals: Ability to identify and develop effective coping behaviors will improve and Ability to maintain clinical measurements within normal limits will improve  I certify that inpatient services furnished can reasonably be expected to improve the patient's condition.    Alethia Berthold, MD 7/29/20195:33 PM

## 2018-06-02 NOTE — Progress Notes (Signed)
Dr. Weber Cooks spoke with Renee Turner (father) regarding the admission of Renee Turner. The father verbalized understanding.

## 2018-06-02 NOTE — Tx Team (Signed)
Initial Treatment Plan 06/02/2018 3:10 PM Renee Turner SYH:[id number]    PATIENT STRESSORS: Medication change or noncompliance Traumatic event   PATIENT STRENGTHS: Average or above average intelligence Communication skills Supportive family/friends Work skills   PATIENT IDENTIFIED PROBLEMS: Depression 06/02/2018   Suicidal thoughts 06/02/2018                   DISCHARGE CRITERIA:  Ability to meet basic life and health needs Adequate post-discharge living arrangements Verbal commitment to aftercare and medication compliance  PRELIMINARY DISCHARGE PLAN: Attend aftercare/continuing care group Return to previous living arrangement  PATIENT/FAMILY INVOLVEMENT: This treatment plan has been presented to and reviewed with the patient, Renee Turner, and/or family member,   The patient and family have been given the opportunity to ask questions and make suggestions.  Merlene Morse, RN 06/02/2018, 3:10 PM

## 2018-06-02 NOTE — Transfer of Care (Signed)
Immediate Anesthesia Transfer of Care Note  Patient: Renee Turner  Procedure(s) Performed: ECT TX  Patient Location: PACU  Anesthesia Type:General  Level of Consciousness: sedated  Airway & Oxygen Therapy: Patient Spontanous Breathing and Patient connected to face mask oxygen  Post-op Assessment: Report given to RN and Post -op Vital signs reviewed and stable  Post vital signs: Reviewed and stable  Last Vitals:  Vitals Value Taken Time  BP 106/75 06/02/2018 11:05 AM  Temp    Pulse 97 06/02/2018 11:06 AM  Resp 21 06/02/2018 11:06 AM  SpO2 100 % 06/02/2018 11:06 AM  Vitals shown include unvalidated device data.  Last Pain:  Vitals:   06/02/18 0926  TempSrc:   PainSc: 0-No pain         Complications: No apparent anesthesia complications 

## 2018-06-02 NOTE — Anesthesia Preprocedure Evaluation (Signed)
Anesthesia Evaluation  Patient identified by MRN, date of birth, ID band Patient awake    Reviewed: Allergy & Precautions, NPO status , Patient's Chart, lab work & pertinent test results  History of Anesthesia Complications Negative for: history of anesthetic complications  Airway Mallampati: III  TM Distance: >3 FB Neck ROM: Full    Dental no notable dental hx.    Pulmonary neg pulmonary ROS, neg sleep apnea, neg COPD,    breath sounds clear to auscultation- rhonchi (-) wheezing      Cardiovascular Exercise Tolerance: Good (-) hypertension(-) CAD, (-) Past MI, (-) Cardiac Stents and (-) CABG  Rhythm:Regular Rate:Normal - Systolic murmurs and - Diastolic murmurs    Neuro/Psych PSYCHIATRIC DISORDERS Anxiety Depression negative neurological ROS     GI/Hepatic negative GI ROS, Neg liver ROS,   Endo/Other  diabetes, Oral Hypoglycemic Agents  Renal/GU negative Renal ROS     Musculoskeletal negative musculoskeletal ROS (+)   Abdominal (+) + obese,   Peds  Hematology negative hematology ROS (+)   Anesthesia Other Findings Past Medical History: No date: Anxiety No date: Depression No date: Diabetes mellitus without complication ([name])     Comment:  Type II No date: Diabetes mellitus, type II ([name])   Reproductive/Obstetrics                             Anesthesia Physical  Anesthesia Plan  ASA: II  Anesthesia Plan: General   Post-op Pain Management:    Induction: Intravenous  PONV Risk Score and Plan: 2 and Ondansetron  Airway Management Planned: Mask  Additional Equipment:   Intra-op Plan:   Post-operative Plan:   Informed Consent: I have reviewed the patients History and Physical, chart, labs and discussed the procedure including the risks, benefits and alternatives for the proposed anesthesia with the patient or authorized representative who has indicated his/her understanding  and acceptance.   Dental advisory given  Plan Discussed with: CRNA and Anesthesiologist  Anesthesia Plan Comments:         Anesthesia Quick Evaluation

## 2018-06-02 NOTE — Anesthesia Procedure Notes (Signed)
Date/Time: 06/02/2018 10:51 AM Performed by: Lily KocherPeralta, Danecia Underdown, CRNA Pre-anesthesia Checklist: Patient identified, Emergency Drugs available, Suction available and Patient being monitored Patient Re-evaluated:Patient Re-evaluated prior to induction Oxygen Delivery Method: Circle system utilized Preoxygenation: Pre-oxygenation with 100% oxygen Induction Type: IV induction Ventilation: Mask ventilation without difficulty and Mask ventilation throughout procedure Airway Equipment and Method: Bite block Placement Confirmation: positive ETCO2 Dental Injury: Teeth and Oropharynx as per pre-operative assessment

## 2018-06-03 ENCOUNTER — Encounter: Payer: Self-pay | Admitting: Psychiatry

## 2018-06-03 ENCOUNTER — Other Ambulatory Visit: Payer: Self-pay | Admitting: Psychiatry

## 2018-06-03 NOTE — [facility] Group Notes (Signed)
06/03/2018 1PM  Type of Therapy/Topic:  Group Therapy:  Feelings about Diagnosis  Participation Level:  Did Not Attend   Description of Group:   This group will allow patients to explore their thoughts and feelings about diagnoses they have received. Patients will be guided to explore their level of understanding and acceptance of these diagnoses. Facilitator will encourage patients to process their thoughts and feelings about the reactions of others to their diagnosis and will guide patients in identifying ways to discuss their diagnosis with significant others in their lives. This group will be process-oriented, with patients participating in exploration of their own experiences, giving and receiving support, and processing challenge from other group members.   Therapeutic Goals: 1. Patient will demonstrate understanding of diagnosis as evidenced by identifying two or more symptoms of the disorder 2. Patient will be able to express two feelings regarding the diagnosis 3. Patient will demonstrate their ability to communicate their needs through discussion and/or role play  Summary of Patient Progress: Patient was encouraged and invited to attend group. Patient did not attend group. Social worker will continue to encourage group participation in the future.        Therapeutic Modalities:   Cognitive Behavioral Therapy Brief Therapy Feelings Identification    Renee ShearsCassandra  Daison Braxton, LCSW 06/03/2018 4:20 PM

## 2018-06-03 NOTE — Consult Note (Signed)
ECT: As I mentioned in yesterday's note I spoke with Dr. Demetrius CharityP today who kindly agreed to accept overall responsibility for this patient.  Very much appreciate that assistance.  Sounds like ECT is still appropriate and we will continue with the plan with ECT through the week.  Next treatment tomorrow.  She is on the schedule and should be n.p.o. after midnight tonight.

## 2018-06-03 NOTE — Progress Notes (Addendum)
[facility] MD Progress Note  06/03/2018 3:10 PM Renee Turner  MRN:  [id number]  Subjective:    Renee Turner presents this morning with severe psychomotor retardation. She is in her room with her head covered wit a hoodie barely moving her lips. She does agree to come to my office and has a beginning of a conversation. many questions remain unanswered with great latency of response throughout. She reports that over the weekend she was able to drive her little sister's friends to the mall where they did shopping and ate. She later played games with her friends. On Sunday, again she was able to get out of the house. She believes that her family noticed a positive change. She however arrived for ECT treatment on Monday in bad shape enough to be admitted. She feels hopeless and suicidal. She has no energy or drive to get better. Tolerates ECT well. Received 7 treatments so far.   Principal Problem: Severe recurrent major depression without psychotic features ([place]) Diagnosis:   Patient Active Problem List   Diagnosis Date Noted  . Severe recurrent major depression without psychotic features ([place]) [F33.2] 05/14/2018    Priority: High  . MDD (major depressive disorder), recurrent severe, without psychosis ([place]) [F33.2] 05/06/2018  . 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 Posttraumatic stress disorder [F43.10] 11/13/2016  . Severe episode of recurrent major depressive disorder, without psychotic features ([place]) [F33.2] 11/07/2016  . Diabetes ([place]) [E11.9] 11/07/2016  . Diabetic neuropathy associated with type 2 diabetes mellitus ([place]) [E11.40] 07/19/2016  . Obesity (BMI 30.0-34.9) [E66.9] 07/19/2016  . Type 2 diabetes mellitus with hyperglycemia, without long-term current use of insulin ([name]) [E11.65] 07/19/2016   Total Time spent with patient: 30 minutes  Past Psychiatric History: depression, PTSD  Past Medical History:  Past Medical History:  Diagnosis Date  . Anxiety   . Depression   .  Diabetes mellitus without complication ([place])    Type II  . Diabetes mellitus, type II ([place])    History reviewed. No pertinent surgical history. Family History: History reviewed. No pertinent family history. Family Psychiatric  History: mother with "problems" Social History:  Social History   Substance and Sexual Activity  Alcohol Use Yes     Social History   Substance and Sexual Activity  Drug Use No    Social History   Socioeconomic History  . Marital status: Single    Spouse name: Not on file  . Number of children: Not on file  . Years of education: Not on file  . Highest education level: Not on file  Occupational History  . Not on file  Social Needs  . Financial resource strain: Not on file  . Food insecurity:    Worry: Not on file    Inability: Not on file  . Transportation needs:    Medical: Not on file    Non-medical: Not on file  Tobacco Use  . Smoking status: Never Smoker  . Smokeless tobacco: Never Used  Substance and Sexual Activity  . Alcohol use: Yes  . Drug use: No  . Sexual activity: Yes    Birth control/protection: None  Lifestyle  . Physical activity:    Days per week: Not on file    Minutes per session: Not on file  . Stress: Not on file  Relationships  . Social connections:    Talks on phone: Not on file    Gets together: Not on file    Attends religious service: Not on file  Active member of club or organization: Not on file    Attends meetings of clubs or organizations: Not on file    Relationship status: Not on file  Other Topics Concern  . Not on file  Social History Narrative  . Not on file   Additional Social History:                         Sleep: Fair  Appetite:  Fair  Current Medications: Current Facility-Administered Medications  Medication Dose Route Frequency Provider Last Rate Last Dose  . acetaminophen (TYLENOL) tablet 650 mg  650 mg Oral Q6H PRN Clapacs, John T, MD      . alum & mag hydroxide-simeth  (MAALOX/MYLANTA) 200-200-20 MG/5ML suspension 30 mL  30 mL Oral Q4H PRN Clapacs, John T, MD      . ferrous sulfate tablet 325 mg  325 mg Oral Q breakfast Clapacs, Madie Reno, MD   325 mg at 06/03/18 0805  . FLUoxetine (PROZAC) capsule 40 mg  40 mg Oral Daily Clapacs, Madie Reno, MD   40 mg at 06/03/18 0805  . gabapentin (NEURONTIN) capsule 300 mg  300 mg Oral TID Clapacs, Madie Reno, MD   300 mg at 06/03/18 1207  . magnesium hydroxide (MILK OF MAGNESIA) suspension 30 mL  30 mL Oral Daily PRN Clapacs, John T, MD      . metFORMIN (GLUCOPHAGE-XR) 24 hr tablet 1,000 mg  1,000 mg Oral BID WC Clapacs, John T, MD   1,000 mg at 06/03/18 0805  . protriptyline (VIVACTIL) tablet 10 mg  10 mg Oral TID Clapacs, John T, MD      . traZODone (DESYREL) tablet 50 mg  50 mg Oral QHS Clapacs, Madie Reno, MD   50 mg at 06/02/18 1957  . traZODone (DESYREL) tablet 50 mg  50 mg Oral QHS PRN Clapacs, Madie Reno, MD        Lab Results:  Results for orders placed or performed during the hospital encounter of 06/02/18 (from the past 48 hour(s))  Pregnancy, urine POC     Status: None   Collection Time: 06/02/18  9:09 AM  Result Value Ref Range   Preg Test, Ur NEGATIVE NEGATIVE    Comment:        THE SENSITIVITY OF THIS METHODOLOGY IS >24 mIU/mL   Glucose, capillary     Status: Abnormal   Collection Time: 06/02/18  9:16 AM  Result Value Ref Range   Glucose-Capillary 124 (H) 70 - 99 mg/dL  Glucose, capillary     Status: Abnormal   Collection Time: 06/02/18 11:10 AM  Result Value Ref Range   Glucose-Capillary 108 (H) 70 - 99 mg/dL    Blood Alcohol level:  Lab Results  Component Value Date   ETH <5 10/62/6948    Metabolic Disorder Labs: Lab Results  Component Value Date   HGBA1C 6.5 (H) 05/07/2018   MPG 139.85 05/07/2018   No results found for: PROLACTIN Lab Results  Component Value Date   CHOL 181 05/07/2018   TRIG 92 05/07/2018   HDL 46 05/07/2018   CHOLHDL 3.9 05/07/2018   VLDL 18 05/07/2018   LDLCALC 117 (H)  05/07/2018    Physical Findings: AIMS: Facial and Oral Movements Muscles of Facial Expression: None, normal, ,  ,  ,    CIWA:    COWS:     Musculoskeletal: Strength & Muscle Tone: within normal limits Gait & Station: normal Patient leans: N/A  Psychiatric  Specialty Exam: Physical Exam  Nursing note and vitals reviewed. Psychiatric: Her affect is blunt. Her speech is delayed. She is slowed and withdrawn. Cognition and memory are impaired. She expresses inappropriate judgment. She exhibits a depressed mood. She expresses suicidal ideation.    Review of Systems  Neurological: Positive for tingling.  Psychiatric/Behavioral: Positive for depression and suicidal ideas.  All other systems reviewed and are negative.   Blood pressure 105/74, pulse 91, temperature 98.5 F (36.9 C), temperature source Oral, resp. rate 16, height _0  (1.676 m), weight 90.3 kg (199 lb), last menstrual period 05/29/2018, SpO2 100 %.Body mass index is 32.12 kg/m.  General Appearance: Casual  Eye Contact:  Fair  Speech:  Blocked and Slow  Volume:  Decreased  Mood:  Depressed and Hopeless  Affect:  Blunt  Thought Process:  Goal Directed and Descriptions of Associations: Tangential  Orientation:  Full (Time, Place, and Person)  Thought Content:  Rumination  Suicidal Thoughts:  Yes.  without intent/plan  Homicidal Thoughts:  No  Memory:  Immediate;   Poor Recent;   Poor Remote;   Poor  Judgement:  Poor  Insight:  Lacking  Psychomotor Activity:  Psychomotor Retardation  Concentration:  Concentration: Poor and Attention Span: Poor  Recall:  Poor  Fund of Knowledge:  Poor  Language:  Fair  Akathisia:  No  Handed:  Right  AIMS (if indicated):     Assets:  Communication Skills Desire for Improvement Financial Resources/Insurance Housing Physical Health Resilience Social Support  ADL's:  Intact  Cognition:  WNL  Sleep:  Number of Hours: 8.15     Treatment Plan Summary: Daily contact with  patient to assess and evaluate symptoms and progress in treatment and Medication management   Ms. Blank is a 22 year old female with a history of treatment resistant depression and PTSD readmitted to the hospital to continue ECT treatment.  #Suicidal ideation -passing thoughts of suicide, hopelessness  #Mood -continue Prozac 40 mg daily -she has been on protriptiline 10 mg daily but it is not on a formulary here, the family failed to deliver medication -Trazodone 50 mg nightly -continue ECT  #DM -Metformin 1000 mg BID -Neurontin 300 mg TID  #Disposition -discharge with family -follow up with Dr. Daron Offer, her regular psychiatrist, and therapist   Orson Slick, MD 06/03/2018, 3:10 PM

## 2018-06-03 NOTE — Progress Notes (Signed)
Recreation Therapy Notes  INPATIENT RECREATION THERAPY ASSESSMENT  Patient Details Name: Renee Turner MRN: [id number] DOB: 05-19-1996 Today's Date: 06/03/2018       Information Obtained From: Patient  Able to Participate in Assessment/Interview: Yes  Patient Presentation: Responsive  Reason for Admission (Per Patient): Active Symptoms  Patient Stressors: Family  Coping Skills:   TV  Leisure Interests (2+):  Individual - TV  Frequency of Recreation/Participation: Weekly  Awareness of Community Resources:  Yes  Community Resources:  Library  Current Use: Yes  If no, Barriers?:    Expressed Interest in [place]: Yes  County of Residence:  [place]  Patient Main Form of Transportation: Car  Patient Strengths:  caring for others  Patient Identified Areas of Improvement:  talking  Patient Goal for Hospitalization:  N/A  Current SI (including self-harm):     Current HI:     Current AVH:    Staff Intervention Plan: Group Attendance, Collaborate with Interdisciplinary Treatment Team  Consent to Intern Participation: N/A  Diannie Willner 06/03/2018, 3:15 PM

## 2018-06-03 NOTE — Plan of Care (Signed)
Quiet, pleasant on approach, mood and affect flat, thought blocking, endorses SI but no plan, "my depression [facility] [employer]; I have a 22 yo brother, I am the 2nd child and I have 4 other younger sisters [id number], [id number], [id number] & [id number]..." Our conversation was interrupted due to a new admission on the unit. Will continue to engage and encourage verbalization of feelings. Poorly kept skin conditions and poor hygiene.  Patient slept for Estimated Hours of 8.15; Precautionary checks every 15 minutes for safety maintained, room free of safety hazards, patient sustains no injury or falls during this shift.  Problem: Education: Goal: Emotional status will improve Outcome: Progressing Goal: Verbalization of understanding the information provided will improve Outcome: Progressing   Problem: Health Behavior/Discharge Planning: Goal: Compliance with treatment plan for underlying cause of condition will improve Outcome: Progressing   Problem: Safety: Goal: Periods of time without injury will increase Outcome: Progressing   Problem: Coping: Goal: Coping ability will improve Outcome: Progressing   Problem: Self-Concept: Goal: Level of anxiety will decrease Outcome: Progressing

## 2018-06-03 NOTE — [facility] Suicide Risk Assessment (Signed)
[facility] INPATIENT:  Family/Significant Other Suicide Prevention Education  Suicide Prevention Education:  Patient Refusal for Family/Significant Other Suicide Prevention Education: The patient Renee Turner has refused to provide written consent for family/significant other to be provided Family/Significant Other Suicide Prevention Education during admission and/or prior to discharge.  Physician notified.  Kyleah Pensabene 06/03/2018, 11:58 AM

## 2018-06-03 NOTE — Plan of Care (Signed)
Pt. Verbalizes understanding of provided education. Pt. Compliant with medications and unit procedures. Pt. Presents with passive suicidal thoughts, but reports she can remain safe while on the unit. Pt. Denies Hi. Pt. Verbalizes some feelings. Pt. Emotional and mental status is depressed and thought blocking. Pt. Monitored by staff for safety per orders.   Problem: Education: Goal: Knowledge of  General Education information/materials will improve Outcome: Progressing   Problem: Health Behavior/Discharge Planning: Goal: Compliance with treatment plan for underlying cause of condition will improve Outcome: Progressing   Problem: Safety: Goal: Periods of time without injury will increase Outcome: Progressing   Problem: Coping: Goal: Will verbalize feelings Outcome: Progressing   Problem: Education: Goal: Emotional status will improve Outcome: Not Progressing Goal: Mental status will improve Outcome: Not Progressing

## 2018-06-03 NOTE — Progress Notes (Signed)
Recreation Therapy Notes          Renee Turner 06/03/2018 2:31 PM 
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
## 2018-06-03 NOTE — Progress Notes (Signed)
D: Pt denies current suicidal ideations and verbally contracts for safety, but during morning inventory and suicide assessment reports vague suicidal thoughts that she says come and go. Pt. Reports she can remain safe while on the unit. Pt. Is passive SI. Pt. Has no SI plan. Pt. Denies AVH/HI. Pt. Presents frequently with thought blocking behavior and avertive eye contact. Pt. Frequently isolative and withdrawn to her room. Pt. Reports anxiety and depression "7/10" , but denies PRN medications need.    A: Q x 15 minute observation checks were completed for safety. Patient was provided with education.  Patient was given/offered medications per orders. Patient  was encourage to attend groups, participate in unit activities and continue with plan of care. Pt. Chart and plans of care reviewed. Pt. Given support and encouragement.   R: Patient is complaint with medication and unit procedures.             Precautionary checks every 15 minutes for safety maintained, room free of safety hazards, patient sustains no injury or falls during this shift. Will endorse care to next shift.

## 2018-06-04 ENCOUNTER — Inpatient Hospital Stay: Payer: BLUE CROSS/BLUE SHIELD

## 2018-06-04 ENCOUNTER — Inpatient Hospital Stay: Payer: BLUE CROSS/BLUE SHIELD | Admitting: Registered Nurse

## 2018-06-04 ENCOUNTER — Telehealth (HOSPITAL_COMMUNITY): Payer: Self-pay | Admitting: *Deleted

## 2018-06-04 LAB — GLUCOSE, CAPILLARY: GLUCOSE-CAPILLARY: 102 mg/dL — AB (ref 70–99)

## 2018-06-04 MED ORDER — LABETALOL HCL 5 MG/ML IV SOLN
INTRAVENOUS | Status: DC | PRN
  Administered 2018-06-04: 10 mg via INTRAVENOUS

## 2018-06-04 MED ORDER — MIDAZOLAM HCL 2 MG/2ML IJ SOLN: 2.0000 mg | Freq: Once | INTRAMUSCULAR | Status: DC

## 2018-06-04 MED ORDER — SUCCINYLCHOLINE CHLORIDE 20 MG/ML IJ SOLN
INTRAMUSCULAR | Status: DC | PRN
  Administered 2018-06-04: 100 mg via INTRAVENOUS

## 2018-06-04 MED ORDER — ESMOLOL HCL 100 MG/10ML IV SOLN
INTRAVENOUS | Status: DC | PRN
  Administered 2018-06-04: 30 mg via INTRAVENOUS

## 2018-06-04 MED ORDER — ONDANSETRON HCL 4 MG/2ML IJ SOLN
4.0000 mg | Freq: Once | INTRAMUSCULAR | Status: AC
  Administered 2018-06-04: 4 mg via INTRAVENOUS

## 2018-06-04 MED ORDER — SODIUM CHLORIDE 0.9 % IV SOLN
500.0000 mL | Freq: Once | INTRAVENOUS | Status: AC
  Administered 2018-06-04: 500 mL via INTRAVENOUS

## 2018-06-04 MED ORDER — KETOROLAC TROMETHAMINE 30 MG/ML IJ SOLN
INTRAMUSCULAR | Status: AC
  Administered 2018-06-04: 30 mg via INTRAVENOUS
  Filled 2018-06-04: qty 1

## 2018-06-04 MED ORDER — METHOHEXITAL SODIUM 100 MG/10ML IV SOSY
PREFILLED_SYRINGE | INTRAVENOUS | Status: DC | PRN
  Administered 2018-06-04: 90 mg via INTRAVENOUS

## 2018-06-04 MED ORDER — GLYCOPYRROLATE 0.2 MG/ML IJ SOLN
0.2000 mg | Freq: Once | INTRAMUSCULAR | Status: AC
  Administered 2018-06-04: 0.2 mg via INTRAVENOUS

## 2018-06-04 MED ORDER — ONDANSETRON HCL 4 MG/2ML IJ SOLN
INTRAMUSCULAR | Status: AC
  Administered 2018-06-04: 4 mg via INTRAVENOUS
  Filled 2018-06-04: qty 2

## 2018-06-04 MED ORDER — SODIUM CHLORIDE 0.9 % IV SOLN
INTRAVENOUS | Status: DC | PRN
  Administered 2018-06-04: 10:00:00 via INTRAVENOUS

## 2018-06-04 MED ORDER — GLYCOPYRROLATE 0.2 MG/ML IJ SOLN
INTRAMUSCULAR | Status: AC
  Administered 2018-06-04: 0.2 mg via INTRAVENOUS
  Filled 2018-06-04: qty 1

## 2018-06-04 MED ORDER — KETOROLAC TROMETHAMINE 30 MG/ML IJ SOLN
30.0000 mg | Freq: Once | INTRAMUSCULAR | Status: AC
  Administered 2018-06-04: 30 mg via INTRAVENOUS
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
## 2018-06-04 NOTE — Transfer of Care (Signed)
Immediate Anesthesia Transfer of Care Note  Patient: Renee Turner  Procedure(s) Performed: ECT TX  Patient Location: PACU  Anesthesia Type:General  Level of Consciousness: sedated  Airway & Oxygen Therapy: Patient Spontanous Breathing and Patient connected to face mask oxygen  Post-op Assessment: Report given to RN and Post -op Vital signs reviewed and stable  Post vital signs: Reviewed and stable  Last Vitals:  Vitals Value Taken Time  BP 124/97 06/04/2018 10:30 AM  Temp 37 C 06/04/2018 10:30 AM  Pulse 89 06/04/2018 10:30 AM  Resp 16 06/04/2018 10:30 AM  SpO2 100 % 06/04/2018 10:30 AM    Last Pain:  Vitals:   06/04/18 1030  TempSrc:   PainSc: Asleep         Complications: No apparent anesthesia complications

## 2018-06-04 NOTE — Anesthesia Postprocedure Evaluation (Signed)
Anesthesia Post Note  Patient: Amiria Crow  Procedure(s) Performed: ECT TX  Patient location during evaluation: PACU Anesthesia Type: General Level of consciousness: awake and alert Pain management: pain level controlled Vital Signs Assessment: post-procedure vital signs reviewed and stable Respiratory status: spontaneous breathing, nonlabored ventilation and respiratory function stable Cardiovascular status: blood pressure returned to baseline and stable Postop Assessment: no apparent nausea or vomiting Anesthetic complications: no     Last Vitals:  Vitals:   06/04/18 1049 06/04/18 1100  BP: 116/90 119/83  Pulse: 89 98  Resp: 16 16  Temp: 36.4 C   SpO2: 100% 100%    Last Pain:  Vitals:   06/04/18 1100  TempSrc:   PainSc: 0-No pain                 Alphonsus Sias

## 2018-06-04 NOTE — Anesthesia Post-op Follow-up Note (Signed)
Anesthesia QCDR form completed.        

## 2018-06-04 NOTE — Tx Team (Signed)
Interdisciplinary Treatment and Diagnostic Plan Update  06/04/2018 Time of Session: 2:15pm Renee Turner MRN: [id number]  Principal Diagnosis: Severe recurrent major depression without psychotic features [facility])  Secondary Diagnoses: Principal Problem:   Severe recurrent major depression without psychotic features ([place]) Active Problems:   Posttraumatic stress disorder   Diabetic neuropathy associated with type 2 diabetes mellitus ([place])   Type 2 diabetes mellitus with hyperglycemia, without long-term current use of insulin ([name])   Current Medications:  Current Facility-Administered Medications  Medication Dose Route Frequency Provider Last Rate Last Dose  . acetaminophen (TYLENOL) tablet 650 mg  650 mg Oral Q6H PRN Clapacs, John T, MD      . alum & mag hydroxide-simeth (MAALOX/MYLANTA) 200-200-20 MG/5ML suspension 30 mL  30 mL Oral Q4H PRN Clapacs, John T, MD      . ferrous sulfate tablet 325 mg  325 mg Oral Q breakfast Clapacs, Madie Reno, MD   325 mg at 06/04/18 1223  . FLUoxetine (PROZAC) capsule 40 mg  40 mg Oral Daily Clapacs, Madie Reno, MD   40 mg at 06/04/18 1223  . gabapentin (NEURONTIN) capsule 300 mg  300 mg Oral TID Clapacs, Madie Reno, MD   300 mg at 06/04/18 1223  . magnesium hydroxide (MILK OF MAGNESIA) suspension 30 mL  30 mL Oral Daily PRN Clapacs, John T, MD      . metFORMIN (GLUCOPHAGE-XR) 24 hr tablet 1,000 mg  1,000 mg Oral BID WC Clapacs, John T, MD   1,000 mg at 06/03/18 1631  . midazolam (VERSED) injection 2 mg  2 mg Intravenous Once Clapacs, John T, MD      . protriptyline (VIVACTIL) tablet 10 mg  10 mg Oral TID Clapacs, John T, MD      . traZODone (DESYREL) tablet 50 mg  50 mg Oral QHS Clapacs, Madie Reno, MD   50 mg at 06/03/18 2000  . traZODone (DESYREL) tablet 50 mg  50 mg Oral QHS PRN Clapacs, Madie Reno, MD       PTA Medications: Medications Prior to Admission  Medication Sig Dispense Refill Last Dose  . ferrous sulfate 325 (65 FE) MG tablet Take 1 tablet (325 mg total) by mouth  daily with breakfast. (Patient not taking: Reported on 06/02/2018) 30 tablet 0 Not Taking at Unknown time  . FLUoxetine (PROZAC) 40 MG capsule Take 1 capsule (40 mg total) by mouth daily. (Patient not taking: Reported on 06/02/2018) 30 capsule 0 Not Taking at Unknown time  . gabapentin (NEURONTIN) 300 MG capsule Take 1 capsule (300 mg total) by mouth 3 (three) times daily. (Patient not taking: Reported on 06/02/2018) 90 capsule 0 Not Taking at Unknown time  . metFORMIN (FORTAMET) 1000 MG (OSM) 24 hr tablet Take 1 tablet (1,000 mg total) by mouth 2 (two) times daily with a meal. (Patient not taking: Reported on 06/02/2018) 60 tablet 0 Not Taking at Unknown time  . protriptyline (VIVACTIL) 10 MG tablet Take 1 tablet (10 mg total) by mouth 3 (three) times daily. Narcolepsy (Patient not taking: Reported on 06/02/2018) 90 tablet 0 Not Taking at Unknown time  . traZODone (DESYREL) 50 MG tablet Take 1 tablet (50 mg total) by mouth at bedtime. Take 2 hours before bedtime: For sleep (Patient not taking: Reported on 06/02/2018) 30 tablet 0 Not Taking at Unknown time  . traZODone (DESYREL) 50 MG tablet Take 1 tablet (50 mg total) by mouth at bedtime as needed for sleep. (Patient not taking: Reported on 06/02/2018) 30 tablet 0 Not Taking  at Unknown time    Patient Stressors: Medication change or noncompliance Traumatic event  Patient Strengths: Average or above average intelligence Communication skills Supportive family/friends Work skills  Treatment Modalities: Medication Management, Group therapy, Case management,  1 to 1 session with clinician, Psychoeducation, Recreational therapy.   Physician Treatment Plan for Primary Diagnosis: Severe recurrent major depression without psychotic features ([place]) Long Term Goal(s): Improvement in symptoms so as ready for discharge Improvement in symptoms so as ready for discharge   Short Term Goals: Ability to verbalize feelings will improve Ability to disclose and  discuss suicidal ideas Ability to identify and develop effective coping behaviors will improve Ability to maintain clinical measurements within normal limits will improve  Medication Management: Evaluate patient's response, side effects, and tolerance of medication regimen.  Therapeutic Interventions: 1 to 1 sessions, Unit Group sessions and Medication administration.  Evaluation of Outcomes: Progressing  Physician Treatment Plan for Secondary Diagnosis: Principal Problem:   Severe recurrent major depression without psychotic features ([place]) Active Problems:   Posttraumatic stress disorder   Diabetic neuropathy associated with type 2 diabetes mellitus ([name])   Type 2 diabetes mellitus with hyperglycemia, without long-term current use of insulin ([name])  Long Term Goal(s): Improvement in symptoms so as ready for discharge Improvement in symptoms so as ready for discharge   Short Term Goals: Ability to verbalize feelings will improve Ability to disclose and discuss suicidal ideas Ability to identify and develop effective coping behaviors will improve Ability to maintain clinical measurements within normal limits will improve     Medication Management: Evaluate patient's response, side effects, and tolerance of medication regimen.  Therapeutic Interventions: 1 to 1 sessions, Unit Group sessions and Medication administration.  Evaluation of Outcomes: Progressing   RN Treatment Plan for Primary Diagnosis: Severe recurrent major depression without psychotic features ([place]) Long Term Goal(s): Knowledge of disease and therapeutic regimen to maintain health will improve  Short Term Goals: Ability to verbalize frustration and anger appropriately will improve, Ability to verbalize feelings will improve, Ability to identify and develop effective coping behaviors will improve and Compliance with prescribed medications will improve  Medication Management: RN will administer medications as ordered  by provider, will assess and evaluate patient's response and provide education to patient for prescribed medication. RN will report any adverse and/or side effects to prescribing provider.  Therapeutic Interventions: 1 on 1 counseling sessions, Psychoeducation, Medication administration, Evaluate responses to treatment, Monitor vital signs and CBGs as ordered, Perform/monitor CIWA, COWS, AIMS and Fall Risk screenings as ordered, Perform wound care treatments as ordered.  Evaluation of Outcomes: Progressing   LCSW Treatment Plan for Primary Diagnosis: Severe recurrent major depression without psychotic features ([place]) Long Term Goal(s): Safe transition to appropriate next level of care at discharge, Engage patient in therapeutic group addressing interpersonal concerns.  Short Term Goals: Engage patient in aftercare planning with referrals and resources, Increase social support, Identify triggers associated with mental health/substance abuse issues and Increase skills for wellness and recovery  Therapeutic Interventions: Assess for all discharge needs, 1 to 1 time with Social worker, Explore available resources and support systems, Assess for adequacy in community support network, Educate family and significant other(s) on suicide prevention, Complete Psychosocial Assessment, Interpersonal group therapy.  Evaluation of Outcomes: Progressing   Progress in Treatment: Attending groups: No. Participating in groups: No. Taking medication as prescribed: Yes. Toleration medication: Yes. Family/Significant other contact made: No, will contact:  Patient refused Patient understands diagnosis: Yes. Discussing patient identified problems/goals with staff: Yes. Medical problems  stabilized or resolved: Yes. Denies suicidal/homicidal ideation: Yes. Issues/concerns per patient self-inventory: No. Other:   New problem(s) identified: No, Describe:  None  New Short Term/Long Term Goal(s): No goal  identified  Patient Goals:  No goal identified  Discharge Plan or Barriers:  To return home and follow up with outpatient treatment with your providers.  Reason for Continuation of Hospitalization: Anxiety Depression Medication stabilization  Estimated Length of Stay: 7 days  Attendees: Patient: Renee Turner 06/04/2018 2:57 PM  Physician: Dr. Bary Leriche, MD 06/04/2018 2:57 PM  Nursing:  06/04/2018 2:57 PM  RN Care Manager: 06/04/2018 2:57 PM  Social Worker: Darin Engels, [place] 06/04/2018 2:57 PM  Recreational Therapist:  06/04/2018 2:57 PM  Other:  06/04/2018 2:57 PM  Other:  06/04/2018 2:57 PM  Other: 06/04/2018 2:57 PM    Scribe for Treatment Team: Darin Engels, LCSW 06/04/2018 2:57 PM

## 2018-06-04 NOTE — Progress Notes (Signed)
D: Patient stated slept good last night .Stated appetite is good and energy level  Is normal. Stated concentration is good . Stated on Depression scale 5 , hopeless 3 and anxiety 5 .( low 0-10 high) Denies suicidal  homicidal ideations  .  No auditory hallucinations  No pain concerns . Appropriate ADL'S. Interacting with peers and staff.  Patient knowledgeable of information received . Which is given in concrete for for better  understanding  . Emotional and Mental status  slight improvement . Patient able to  join peers after 3 PM. Patient continue the resource of ECT for her depression. No safety concerns . Patient working on coping  issues   No group attendance today   A: Encourage patient participation with unit programming . Instruction  Given on  Medication , verbalize understanding.  R: Voice no other concerns. Staff continue to monitor

## 2018-06-04 NOTE — Procedures (Signed)
ECT SERVICES Physician's Interval Evaluation & Treatment Note  Patient Identification: Goldia Ligman MRN:  [id number] Date of Evaluation:  06/04/2018 TX #: [id number]  MADRS:   MMSE:   P.E. Findings:  No change to physical exam  Psychiatric Interval Note:  Remains down flat limited interaction  Subjective:  Patient is a 22 y.o. female seen for evaluation for Electroconvulsive Therapy. Depressed with suicidal ideation  Treatment Summary:   '[x]'   Right Unilateral             '[]'  Bilateral   % Energy : 0.3 ms and 50%   Impedance: 1250 ohms  Seizure Energy Index: 18,101 V squared  Postictal Suppression Index: 98%  Seizure Concordance Index: 99%  Medications  Pre Shock: Robinul 0.1 mg labetalol 10 mg esmolol 30 mg Toradol 30 mg Brevital 90 mg succinylcholine 100 mg  Post Shock:    Seizure Duration: 24 seconds EMG 50 seconds EEG   Comments: Follow-up Friday  Lungs:  '[x]'   Clear to auscultation               '[]'  Other:   Heart:    '[x]'   Regular rhythm             '[]'  irregular rhythm    '[x]'   Previous H&P reviewed, patient examined and there are NO CHANGES                 '[]'   Previous H&P reviewed, patient examined and there are changes noted.   Alethia Berthold, MD 7/31/201910:17 AM

## 2018-06-04 NOTE — Anesthesia Procedure Notes (Signed)
Performed by: Somaly Marteney, CRNA Pre-anesthesia Checklist: Patient identified, Emergency Drugs available, Suction available and Patient being monitored Patient Re-evaluated:Patient Re-evaluated prior to induction Oxygen Delivery Method: Circle system utilized Preoxygenation: Pre-oxygenation with 100% oxygen Induction Type: IV induction Ventilation: Mask ventilation without difficulty and Mask ventilation throughout procedure Airway Equipment and Method: Bite block Placement Confirmation: positive ETCO2 Dental Injury: Teeth and Oropharynx as per pre-operative assessment        

## 2018-06-04 NOTE — Progress Notes (Signed)
Recreation Therapy Notes          Renee Turner 06/04/2018 12:04 PM

## 2018-06-04 NOTE — Plan of Care (Signed)
Visitation by mother, Ms Thomes DinningLengkur and Sister, Ms Jola Baptistyamal went very well, I had a chance to talk with family. Patient was participatory, bright, happy mood and affect during the visitation. I had 1:1 session with patient to encourage verbalization of feelings and she freely expressed feelings and talked about her family and how they migrated from [place]. Sibilings: Ku[place] 22 yo brother; 4sisters: Nyamal 17, Nybehna 15, Nydehel 14, and Mimi 13.   Patient said, "my hope is back" I gave her a page of blank paper where she wrote "my hope is back 42 times."  NPO past MN for ECT, Pre-ECT CBG=102  Patient slept for Estimated Hours of 7.15; Precautionary checks every 15 minutes for safety maintained, room free of safety hazards, patient sustains no injury or falls during this shift.  Problem: Education: Goal: Emotional status will improve Outcome: Progressing Goal: Mental status will improve Outcome: Progressing Goal: Verbalization of understanding the information provided will improve Outcome: Progressing   Problem: Health Behavior/Discharge Planning: Goal: Identification of resources available to assist in meeting health care needs will improve Outcome: Progressing Goal: Compliance with treatment plan for underlying cause of condition will improve Outcome: Progressing   Problem: Coping: Goal: Coping ability will improve Outcome: Progressing Goal: Will verbalize feelings Outcome: Progressing   Problem: Self-Concept: Goal: Will verbalize positive feelings about self Outcome: Progressing Goal: Level of anxiety will decrease Outcome: Progressing

## 2018-06-04 NOTE — Telephone Encounter (Signed)
Called [employer] spoke with Renee Turner who approved 6 sessions from 06/02/18-08/04/18, authorization #[id number].

## 2018-06-04 NOTE — [facility] Group Notes (Signed)
[facility] Group Notes:  (Nursing/MHT/Case Management/Adjunct)  Date:  06/04/2018  Time:  9:34 PM  Type of Therapy:  Group Therapy  Participation Level:  Did Not Attend   Renee Turner 06/04/2018, 9:34 PM 

## 2018-06-04 NOTE — Anesthesia Preprocedure Evaluation (Signed)
Anesthesia Evaluation  Patient identified by MRN, date of birth, ID band Patient awake    Reviewed: Allergy & Precautions, H&P , NPO status , reviewed documented beta blocker date and time   Airway Mallampati: III  TM Distance: >3 FB Neck ROM: full    Dental  (+) Chipped   Pulmonary    Pulmonary exam normal        Cardiovascular Normal cardiovascular exam     Neuro/Psych PSYCHIATRIC DISORDERS Anxiety Depression    GI/Hepatic   Endo/Other  diabetesMorbid obesity  Renal/GU      Musculoskeletal   Abdominal   Peds  Hematology   Anesthesia Other Findings Past Medical History: No date: Anxiety No date: Depression No date: Diabetes mellitus without complication ([name])     Comment:  Type II No date: Diabetes mellitus, type II ([name])  History reviewed. No pertinent surgical history.  BMI    Body Mass Index:  32.12 kg/m      Reproductive/Obstetrics                             Anesthesia Physical Anesthesia Plan  ASA: III  Anesthesia Plan: General   Post-op Pain Management:    Induction:   PONV Risk Score and Plan: Treatment may vary due to age or medical condition and TIVA  Airway Management Planned:   Additional Equipment:   Intra-op Plan:   Post-operative Plan:   Informed Consent: I have reviewed the patients History and Physical, chart, labs and discussed the procedure including the risks, benefits and alternatives for the proposed anesthesia with the patient or authorized representative who has indicated his/her understanding and acceptance.   Dental Advisory Given  Plan Discussed with: CRNA  Anesthesia Plan Comments:         Anesthesia Quick Evaluation

## 2018-06-04 NOTE — H&P (Signed)
Renee Turner is an 22 y.o. female.   Chief Complaint: major depression  HPI: depression, chronic  Past Medical History:  Diagnosis Date  . Anxiety   . Depression   . Diabetes mellitus without complication ([place])    Type II  . Diabetes mellitus, type II ([place])     History reviewed. No pertinent surgical history.  History reviewed. No pertinent family history. Social History:  reports that she has never smoked. She has never used smokeless tobacco. She reports that she drinks alcohol. She reports that she does not use drugs.  Allergies: No Known Allergies  Medications Prior to Admission  Medication Sig Dispense Refill  . ferrous sulfate 325 (65 FE) MG tablet Take 1 tablet (325 mg total) by mouth daily with breakfast. (Patient not taking: Reported on 06/02/2018) 30 tablet 0  . FLUoxetine (PROZAC) 40 MG capsule Take 1 capsule (40 mg total) by mouth daily. (Patient not taking: Reported on 06/02/2018) 30 capsule 0  . gabapentin (NEURONTIN) 300 MG capsule Take 1 capsule (300 mg total) by mouth 3 (three) times daily. (Patient not taking: Reported on 06/02/2018) 90 capsule 0  . metFORMIN (FORTAMET) 1000 MG (OSM) 24 hr tablet Take 1 tablet (1,000 mg total) by mouth 2 (two) times daily with a meal. (Patient not taking: Reported on 06/02/2018) 60 tablet 0  . protriptyline (VIVACTIL) 10 MG tablet Take 1 tablet (10 mg total) by mouth 3 (three) times daily. Narcolepsy (Patient not taking: Reported on 06/02/2018) 90 tablet 0  . traZODone (DESYREL) 50 MG tablet Take 1 tablet (50 mg total) by mouth at bedtime. Take 2 hours before bedtime: For sleep (Patient not taking: Reported on 06/02/2018) 30 tablet 0  . traZODone (DESYREL) 50 MG tablet Take 1 tablet (50 mg total) by mouth at bedtime as needed for sleep. (Patient not taking: Reported on 06/02/2018) 30 tablet 0    Results for orders placed or performed during the hospital encounter of 06/02/18 (from the past 48 hour(s))  Glucose, capillary     Status: Abnormal    Collection Time: 06/04/18  6:35 AM  Result Value Ref Range   Glucose-Capillary 102 (H) 70 - 99 mg/dL   Comment 1 Notify RN    No results found.  Review of Systems  Constitutional: Negative.   HENT: Negative.   Eyes: Negative.   Respiratory: Negative.   Cardiovascular: Negative.   Gastrointestinal: Negative.   Musculoskeletal: Negative.   Skin: Negative.   Neurological: Negative.   Psychiatric/Behavioral: Positive for depression and suicidal ideas. Negative for hallucinations, memory loss and substance abuse. The patient is not nervous/anxious and does not have insomnia.     Blood pressure 113/82, pulse 82, temperature 98.4 F (36.9 C), temperature source Oral, resp. rate 16, height '5\' 6"'  (1.676 m), weight 90.3 kg (199 lb), last menstrual period 05/29/2018, SpO2 98 %. Physical Exam  Nursing note and vitals reviewed. Constitutional: She appears well-developed and well-nourished.  HENT:  Head: Normocephalic and atraumatic.  Eyes: Pupils are equal, round, and reactive to light. Conjunctivae are normal.  Neck: Normal range of motion.  Cardiovascular: Regular rhythm and normal heart sounds.  Respiratory: Effort normal.  GI: Soft.  Musculoskeletal: Normal range of motion.  Neurological: She is alert.  Skin: Skin is warm and dry.  Psychiatric: Her behavior is normal. Judgment normal. She exhibits a depressed mood. She expresses suicidal ideation. She expresses no suicidal plans.     Assessment/Plan ECT starting today  Alethia Berthold, MD 06/04/2018, 9:01 AM

## 2018-06-04 NOTE — Plan of Care (Signed)
Patient knowledgeable of information received . Which is given in concrete for for better  understanding  . Emotional and Mental status  slight improvement . Patient able to  join peers after 3 PM. Patient continue the resource of ECT for her depression. No safety concerns . Patient working on coping  issues   Nlo group attendance today    Problem: Education: Goal: Knowledge of [facility] Education information/materials will improve Outcome: Progressing Goal: Emotional status will improve Outcome: Progressing Goal: Mental status will improve Outcome: Progressing Goal: Verbalization of understanding the information provided will improve Outcome: Progressing   Problem: Health Behavior/Discharge Planning: Goal: Identification of resources available to assist in meeting health care needs will improve Outcome: Progressing Goal: Compliance with treatment plan for underlying cause of condition will improve Outcome: Progressing   Problem: Safety: Goal: Periods of time without injury will increase Outcome: Progressing   Problem: Coping: Goal: Coping ability will improve Outcome: Progressing Goal: Will verbalize feelings Outcome: Progressing   Problem: Role Relationship: Goal: Will demonstrate positive changes in social behaviors and relationships Outcome: Progressing   Problem: Self-Concept: Goal: Will verbalize positive feelings about self Outcome: Progressing Goal: Level of anxiety will decrease Outcome: Progressing

## 2018-06-05 NOTE — Plan of Care (Signed)
Patient continues to improve in mood and affect, continues to freely express feelings, bashful and shy but can talk; no side effects of ECT symptoms, reduced suicidal thoughts.  Patient slept for Estimated Hours of 6.30; Precautionary checks every 15 minutes for safety maintained, room free of safety hazards, patient sustains no injury or falls during this shift.  Problem: Education: Goal: Emotional status will improve Outcome: Progressing Goal: Mental status will improve Outcome: Progressing Goal: Verbalization of understanding the information provided will improve Outcome: Progressing   Problem: Health Behavior/Discharge Planning: Goal: Compliance with treatment plan for underlying cause of condition will improve Outcome: Progressing   Problem: Safety: Goal: Periods of time without injury will increase Outcome: Progressing   Problem: Coping: Goal: Coping ability will improve Outcome: Progressing Goal: Will verbalize feelings Outcome: Progressing   Problem: Self-Concept: Goal: Level of anxiety will decrease Outcome: Progressing

## 2018-06-05 NOTE — Plan of Care (Signed)
Which is given in concrete for for better  understanding  . Emotional and Mental status  slight improvement . Patient able to  join peers after 3 PM. Patient continue the resource of ECT for her depression. No safety concerns . Patient working on coping  issues   Nlo group attendance today Patient knowledgeable of information received   Problem: Education: Goal: Knowledge of [facility] Education information/materials will improve Outcome: Progressing Goal: Emotional status will improve Outcome: Progressing Goal: Mental status will improve Outcome: Progressing Goal: Verbalization of understanding the information provided will improve Outcome: Progressing   Problem: Health Behavior/Discharge Planning: Goal: Identification of resources available to assist in meeting health care needs will improve Outcome: Progressing Goal: Compliance with treatment plan for underlying cause of condition will improve Outcome: Progressing   Problem: Safety: Goal: Periods of time without injury will increase Outcome: Progressing   Problem: Coping: Goal: Coping ability will improve Outcome: Progressing Goal: Will verbalize feelings Outcome: Progressing   Problem: Role Relationship: Goal: Will demonstrate positive changes in social behaviors and relationships Outcome: Progressing   Problem: Self-Concept: Goal: Will verbalize positive feelings about self Outcome: Progressing Goal: Level of anxiety will decrease Outcome: Progressing

## 2018-06-05 NOTE — [facility] Group Notes (Signed)
LCSW Group Therapy Note 06/05/2018 9:00 AM  Type of Therapy and Topic:  Group Therapy:  Setting Goals  Participation Level:  Did Not Attend  Description of Group: In this process group, patients discussed using strengths to work toward goals and address challenges.  Patients identified two positive things about themselves and one goal they were working on.  Patients were given the opportunity to share openly and support each other's plan for self-empowerment.  The group discussed the value of gratitude and were encouraged to have a daily reflection of positive characteristics or circumstances.  Patients were encouraged to identify a plan to utilize their strengths to work on current challenges and goals.  Therapeutic Goals 1. Patient will verbalize personal strengths/positive qualities and relate how these can assist with achieving desired personal goals 2. Patients will verbalize affirmation of peers plans for personal change and goal setting 3. Patients will explore the value of gratitude and positive focus as related to successful achievement of goals 4. Patients will verbalize a plan for regular reinforcement of personal positive qualities and circumstances.  Summary of Patient Progress:  Azzie AlmasKarial was invited to today's group, but chose not to attend.     Therapeutic Modalities Cognitive Behavioral Therapy Motivational Interviewing    Alease FrameSonya S Chattie Greeson, [place]CS[place] 06/05/2018 12:28 PM

## 2018-06-05 NOTE — Progress Notes (Signed)
D: Patient stated slept good last night .Stated appetite is good and energy level  Is normal. Stated concentration is good . Stated on Depression scale 5 , hopeless 6 and anxiety  3.( low 0-10 high) Denies suicidal  homicidal ideations  .  No auditory hallucinations  No pain concerns . Appropriate ADL'S. Interacting with peers and staff. Which is given in concrete for for better  understanding  . Emotional and Mental status  slight improvement . Patient able to  join peers after 3 PM. Patient continue the resource of ECT for her depression. No safety concerns . Patient working on coping  issues   Nlo group attendance today Patient knowledgeable of information received   A: Encourage patient participation with unit programming . Instruction  Given on  Medication , verbalize understanding.  R: Voice no other concerns. Staff continue to monitor

## 2018-06-05 NOTE — Progress Notes (Addendum)
[facility] MD Progress Note  06/05/2018 4:28 PM Alahna Dunne  MRN:  [id number]  Subjective:    Ms. Melnik is nicely groomed today. She seems more relaxed and maintains good eye contact. She spend time with PA student able to share some of her trauma. She was able to engage in almost normal conversation with me but the latency of response is still frustrating. She was able to make several phone calls to her psychiatrist, therapist and pharmacy from my office to confirm her appointments next week. She was efficient and business -like on the phone. No side effects from medications but she has not been getting Vivactil here. Per pharmacy, it will be available at home.   Principal Problem: Severe recurrent major depression without psychotic features ([place]) Diagnosis:   Patient Active Problem List   Diagnosis Date Noted  . Severe recurrent major depression without psychotic features ([place]) [F33.2] 05/14/2018    Priority: High  . MDD (major depressive disorder), recurrent severe, without psychosis ([place]) [F33.2] 05/06/2018  . 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 Posttraumatic stress disorder [F43.10] 11/13/2016  . Severe episode of recurrent major depressive disorder, without psychotic features ([place]) [F33.2] 11/07/2016  . Diabetes ([place]) [E11.9] 11/07/2016  . Diabetic neuropathy associated with type 2 diabetes mellitus ([place]) [E11.40] 07/19/2016  . Obesity (BMI 30.0-34.9) [E66.9] 07/19/2016  . Type 2 diabetes mellitus with hyperglycemia, without long-term current use of insulin ([name]) [E11.65] 07/19/2016   Total Time spent with patient: 30 minutes  Past Psychiatric History: depression  Past Medical History:  Past Medical History:  Diagnosis Date  . Anxiety   . Depression   . Diabetes mellitus without complication ([place])    Type II  . Diabetes mellitus, type II ([place])    History reviewed. No pertinent surgical history. Family History: History reviewed. No pertinent family history. Family  Psychiatric  History: depression Social History:  Social History   Substance and Sexual Activity  Alcohol Use Yes     Social History   Substance and Sexual Activity  Drug Use No    Social History   Socioeconomic History  . Marital status: Single    Spouse name: Not on file  . Number of children: Not on file  . Years of education: Not on file  . Highest education level: Not on file  Occupational History  . Not on file  Social Needs  . Financial resource strain: Not on file  . Food insecurity:    Worry: Not on file    Inability: Not on file  . Transportation needs:    Medical: Not on file    Non-medical: Not on file  Tobacco Use  . Smoking status: Never Smoker  . Smokeless tobacco: Never Used  Substance and Sexual Activity  . Alcohol use: Yes  . Drug use: No  . Sexual activity: Yes    Birth control/protection: None  Lifestyle  . Physical activity:    Days per week: Not on file    Minutes per session: Not on file  . Stress: Not on file  Relationships  . Social connections:    Talks on phone: Not on file    Gets together: Not on file    Attends religious service: Not on file    Active member of club or organization: Not on file    Attends meetings of clubs or organizations: Not on file    Relationship status: Not on file  Other Topics Concern  . Not on file  Social History Narrative  .  Not on file   Additional Social History:                         Sleep: Fair  Appetite:  Fair  Current Medications: Current Facility-Administered Medications  Medication Dose Route Frequency Provider Last Rate Last Dose  . acetaminophen (TYLENOL) tablet 650 mg  650 mg Oral Q6H PRN Clapacs, John T, MD      . alum & mag hydroxide-simeth (MAALOX/MYLANTA) 200-200-20 MG/5ML suspension 30 mL  30 mL Oral Q4H PRN Clapacs, John T, MD      . ferrous sulfate tablet 325 mg  325 mg Oral Q breakfast Clapacs, Madie Reno, MD   325 mg at 06/05/18 0759  . FLUoxetine (PROZAC) capsule  40 mg  40 mg Oral Daily Clapacs, Madie Reno, MD   40 mg at 06/05/18 0759  . gabapentin (NEURONTIN) capsule 300 mg  300 mg Oral TID Clapacs, Madie Reno, MD   300 mg at 06/05/18 1157  . magnesium hydroxide (MILK OF MAGNESIA) suspension 30 mL  30 mL Oral Daily PRN Clapacs, John T, MD   30 mL at 06/05/18 1254  . metFORMIN (GLUCOPHAGE-XR) 24 hr tablet 1,000 mg  1,000 mg Oral BID WC Clapacs, Madie Reno, MD   1,000 mg at 06/05/18 0759  . midazolam (VERSED) injection 2 mg  2 mg Intravenous Once Clapacs, John T, MD      . protriptyline (VIVACTIL) tablet 10 mg  10 mg Oral TID Clapacs, John T, MD      . traZODone (DESYREL) tablet 50 mg  50 mg Oral QHS Clapacs, Madie Reno, MD   50 mg at 06/04/18 2000  . traZODone (DESYREL) tablet 50 mg  50 mg Oral QHS PRN Clapacs, Madie Reno, MD        Lab Results:  Results for orders placed or performed during the hospital encounter of 06/02/18 (from the past 48 hour(s))  Glucose, capillary     Status: Abnormal   Collection Time: 06/04/18  6:35 AM  Result Value Ref Range   Glucose-Capillary 102 (H) 70 - 99 mg/dL   Comment 1 Notify RN     Blood Alcohol level:  Lab Results  Component Value Date   ETH <5 94/70/9628    Metabolic Disorder Labs: Lab Results  Component Value Date   HGBA1C 6.5 (H) 05/07/2018   MPG 139.85 05/07/2018   No results found for: PROLACTIN Lab Results  Component Value Date   CHOL 181 05/07/2018   TRIG 92 05/07/2018   HDL 46 05/07/2018   CHOLHDL 3.9 05/07/2018   VLDL 18 05/07/2018   LDLCALC 117 (H) 05/07/2018    Physical Findings: AIMS: Facial and Oral Movements Muscles of Facial Expression: None, normal, ,  ,  ,    CIWA:    COWS:     Musculoskeletal: Strength & Muscle Tone: within normal limits Gait & Station: normal Patient leans: N/A  Psychiatric Specialty Exam: Physical Exam  Nursing note and vitals reviewed. Psychiatric: Judgment and thought content normal. Her affect is blunt. Her speech is delayed. She is slowed and withdrawn.  Cognition and memory are normal.    Review of Systems  Neurological: Negative.   Psychiatric/Behavioral: Positive for depression.  All other systems reviewed and are negative.   Blood pressure 98/68, pulse (!) 116, temperature 98.1 F (36.7 C), temperature source Oral, resp. rate 18, height _0  (1.676 m), weight 90.3 kg (199 lb), last menstrual period 05/29/2018, SpO2 100 %.Body mass  index is 32.12 kg/m.  General Appearance: Casual  Eye Contact:  Good  Speech:  Slow  Volume:  Decreased  Mood:  Depressed  Affect:  Flat  Thought Process:  Goal Directed and Descriptions of Associations: Intact  Orientation:  Full (Time, Place, and Person)  Thought Content:  WDL  Suicidal Thoughts:  Yes.  without intent/plan  Homicidal Thoughts:  No  Memory:  Immediate;   Fair Recent;   Fair Remote;   Fair  Judgement:  Impaired  Insight:  Shallow  Psychomotor Activity:  Normal  Concentration:  Concentration: Fair and Attention Span: Fair  Recall:  [employer] of Knowledge:  Fair  Language:  Fair  Akathisia:  No  Handed:  Right  AIMS (if indicated):     Assets:  Communication Skills Desire for Improvement Financial Resources/Insurance Housing Physical Health Resilience Social Support Transportation  ADL's:  Intact  Cognition:  WNL  Sleep:  Number of Hours: 6.3     Treatment Plan Summary: Daily contact with patient to assess and evaluate symptoms and progress in treatment and Medication management   Ms. Vester is a 22 year old female with a history of treatment resistant depression and PTSD readmitted to the hospital to continue ECT treatment.  #Suicidal ideation, resolved  #Mood, improving -continue Prozac 40 mg daily -she has been on protriptiline 10 mg daily but it is not on a formulary here, the family failed to deliver medication, available after discharge for $25 copay -Trazodone 50 mg nightly -continue ECT  #DM -Metformin 1000 mg BID -Neurontin 300 mg  TID  #Disposition -discharge with family -follow up with Dr. Daron Offer, her regular psychiatrist, and therapist     Orson Slick, MD 06/05/2018, 4:28 PM

## 2018-06-05 NOTE — Progress Notes (Signed)
Recreation Therapy Notes  Date: 06/05/2018  Time: 9:30 pm   Location: Craft Room   Behavioral response: N/A   Intervention Topic: Self-care  Discussion/Intervention: Patient did not attend group.   Clinical Observations/Feedback:  Patient did not attend group.   Jaymian Bogart LRT/CTRS        Jefrey Raburn 06/05/2018 10:54 AM 

## 2018-06-05 NOTE — Consult Note (Signed)
[facility] Consult   Reason for Consult: Follow-up consult for this patient with severe depression and probably PTSD.  Patient now seeing Dr. Mamie Nick because of her preference for a female doctor.  I continue to see the patient for ECT.  Spoke with Dr. Mamie Nick and with nursing as well as the patient today.  Patient continues to express to me being depressed and having passive suicidal ideation without any intention or plan.  No new physical complaints.  I am told that she continues to be more open and talking with females on the unit. Referring Physician: [place] Patient Identification: Renee Turner MRN:  [id number] Principal Diagnosis: Severe recurrent major depression without psychotic features [facility]) Diagnosis:   Patient Active Problem List   Diagnosis Date Noted  . Severe recurrent major depression without psychotic features ([place]) [F33.2] 05/14/2018  . MDD (major depressive disorder), recurrent severe, without psychosis ([place]) [F33.2] 05/06/2018  . 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 Posttraumatic stress disorder [F43.10] 11/13/2016  . Severe episode of recurrent major depressive disorder, without psychotic features ([place]) [F33.2] 11/07/2016  . Diabetes ([place]) [E11.9] 11/07/2016  . Diabetic neuropathy associated with type 2 diabetes mellitus ([place]) [E11.40] 07/19/2016  . Obesity (BMI 30.0-34.9) [E66.9] 07/19/2016  . Type 2 diabetes mellitus with hyperglycemia, without long-term current use of insulin ([name]) [E11.65] 07/19/2016    Total Time spent with patient: 15 minutes  Subjective:   Renee Turner is a 22 y.o. female patient admitted with "I am okay".  HPI: As usual with me the patient barely makes eye contact speaks in an almost an audible whisper but is noticed to be taking care of her hygiene pretty well and is able to interact with peers reasonably.  Does not appear to be obviously psychotic.  Has been tolerating ECT without difficulty.  Past Psychiatric History:  Patient has a history of depression and PTSD.  Was recently discharged and then readmitted to the hospital because of return of depression and noncompliance  Risk to Self:   Risk to Others:   Prior Inpatient Therapy:   Prior Outpatient Therapy:    Past Medical History:  Past Medical History:  Diagnosis Date  . Anxiety   . Depression   . Diabetes mellitus without complication ([place])    Type II  . Diabetes mellitus, type II ([place])    History reviewed. No pertinent surgical history. Family History: History reviewed. No pertinent family history. Family Psychiatric  History: None identified Social History:  Social History   Substance and Sexual Activity  Alcohol Use Yes     Social History   Substance and Sexual Activity  Drug Use No    Social History   Socioeconomic History  . Marital status: Single    Spouse name: Not on file  . Number of children: Not on file  . Years of education: Not on file  . Highest education level: Not on file  Occupational History  . Not on file  Social Needs  . Financial resource strain: Not on file  . Food insecurity:    Worry: Not on file    Inability: Not on file  . Transportation needs:    Medical: Not on file    Non-medical: Not on file  Tobacco Use  . Smoking status: Never Smoker  . Smokeless tobacco: Never Used  Substance and Sexual Activity  . Alcohol use: Yes  . Drug use: No  . Sexual activity: Yes    Birth control/protection: None  Lifestyle  . Physical activity:  Days per week: Not on file    Minutes per session: Not on file  . Stress: Not on file  Relationships  . Social connections:    Talks on phone: Not on file    Gets together: Not on file    Attends religious service: Not on file    Active member of club or organization: Not on file    Attends meetings of clubs or organizations: Not on file    Relationship status: Not on file  Other Topics Concern  . Not on file  Social History Narrative  . Not on file    Additional Social History:    Allergies:  No Known Allergies  Labs:  Results for orders placed or performed during the hospital encounter of 06/02/18 (from the past 48 hour(s))  Glucose, capillary     Status: Abnormal   Collection Time: 06/04/18  6:35 AM  Result Value Ref Range   Glucose-Capillary 102 (H) 70 - 99 mg/dL   Comment 1 Notify RN     Current Facility-Administered Medications  Medication Dose Route Frequency Provider Last Rate Last Dose  . acetaminophen (TYLENOL) tablet 650 mg  650 mg Oral Q6H PRN Clapacs, John T, MD      . alum & mag hydroxide-simeth (MAALOX/MYLANTA) 200-200-20 MG/5ML suspension 30 mL  30 mL Oral Q4H PRN Clapacs, John T, MD      . ferrous sulfate tablet 325 mg  325 mg Oral Q breakfast Clapacs, Madie Reno, MD   325 mg at 06/05/18 0759  . FLUoxetine (PROZAC) capsule 40 mg  40 mg Oral Daily Clapacs, Madie Reno, MD   40 mg at 06/05/18 0759  . gabapentin (NEURONTIN) capsule 300 mg  300 mg Oral TID Clapacs, Madie Reno, MD   300 mg at 06/05/18 1641  . magnesium hydroxide (MILK OF MAGNESIA) suspension 30 mL  30 mL Oral Daily PRN Clapacs, John T, MD   30 mL at 06/05/18 1254  . metFORMIN (GLUCOPHAGE-XR) 24 hr tablet 1,000 mg  1,000 mg Oral BID WC Clapacs, John T, MD   1,000 mg at 06/05/18 1641  . midazolam (VERSED) injection 2 mg  2 mg Intravenous Once Clapacs, John T, MD      . protriptyline (VIVACTIL) tablet 10 mg  10 mg Oral TID Clapacs, John T, MD      . traZODone (DESYREL) tablet 50 mg  50 mg Oral QHS Clapacs, Madie Reno, MD   50 mg at 06/04/18 2000  . traZODone (DESYREL) tablet 50 mg  50 mg Oral QHS PRN Clapacs, Madie Reno, MD        Musculoskeletal: Strength & Muscle Tone: within normal limits Gait & Station: normal Patient leans: N/A  Psychiatric Specialty Exam: Physical Exam  Nursing note and vitals reviewed. Constitutional: She appears well-developed and well-nourished.  HENT:  Head: Normocephalic and atraumatic.  Eyes: Pupils are equal, round, and reactive to  light. Conjunctivae are normal.  Neck: Normal range of motion.  Cardiovascular: Regular rhythm and normal heart sounds.  Respiratory: Effort normal. No respiratory distress.  GI: Soft.  Musculoskeletal: Normal range of motion.  Neurological: She is alert.  Skin: Skin is warm and dry.  Psychiatric: Her affect is blunt. Her speech is delayed. She is slowed and withdrawn. Cognition and memory are normal. She expresses suicidal ideation. She expresses no suicidal plans.    Review of Systems  Constitutional: Negative.   HENT: Negative.   Eyes: Negative.   Respiratory: Negative.   Cardiovascular: Negative.  Gastrointestinal: Negative.   Musculoskeletal: Negative.   Skin: Negative.   Neurological: Negative.   Psychiatric/Behavioral: Positive for depression and suicidal ideas. Negative for hallucinations and substance abuse. The patient is nervous/anxious and has insomnia.     Blood pressure 98/68, pulse (!) 116, temperature 98.1 F (36.7 C), temperature source Oral, resp. rate 18, height _0  (1.676 m), weight 90.3 kg (199 lb), last menstrual period 05/29/2018, SpO2 100 %.Body mass index is 32.12 kg/m.  General Appearance: Fairly Groomed  Eye Contact:  Minimal  Speech:  Slow  Volume:  Decreased  Mood:  Depressed and Dysphoric  Affect:  Congruent  Thought Process:  Coherent  Orientation:  Full (Time, Place, and Person)  Thought Content:  Logical  Suicidal Thoughts:  Yes.  without intent/plan  Homicidal Thoughts:  No  Memory:  Immediate;   Fair Recent;   Fair Remote;   Fair  Judgement:  Fair  Insight:  Fair  Psychomotor Activity:  Decreased  Concentration:  Concentration: Fair  Recall:  [employer] of Knowledge:  Fair  Language:  Fair  Akathisia:  No  Handed:  Right  AIMS (if indicated):     Assets:  Desire for Improvement Housing Physical Health  ADL's:  Intact  Cognition:  Impaired,  Mild  Sleep:  Number of Hours: 6.3     Treatment Plan Summary: Plan Patient will  have ECT again tomorrow morning.  We also plan for ECT on Monday after which if she is stable she can probably be discharged and have maintenance ECT as a trial again.  Patient aware of plan.  Encouraged her to continue to communicate with a full treatment team and to continue medication and group attendance.  Disposition: Supportive therapy provided about ongoing stressors.  Alethia Berthold, MD 06/05/2018 6:22 PM

## 2018-06-05 NOTE — [facility] Group Notes (Signed)
LCSW Group Therapy Note  06/05/2018 1:00pm  Type of Therapy/Topic:  Group Therapy:  Balance in Life  Participation Level:  Minimal  Description of Group:    This group will address the concept of balance and how it feels and looks when one is unbalanced. Patients will be encouraged to process areas in their lives that are out of balance and identify reasons for remaining unbalanced. Facilitators will guide patients in utilizing problem-solving interventions to address and correct the stressor making their life unbalanced. Understanding and applying boundaries will be explored and addressed for obtaining and maintaining a balanced life. Patients will be encouraged to explore ways to assertively make their unbalanced needs known to significant others in their lives, using other group members and facilitator for support and feedback.  Therapeutic Goals: 1. Patient will identify two or more emotions or situations they have that consume much of in their lives. 2. Patient will identify signs/triggers that life has become out of balance:  3. Patient will identify two ways to set boundaries in order to achieve balance in their lives:  4. Patient will demonstrate ability to communicate their needs through discussion and/or role plays  Summary of Patient Progress:  Renee Turner participated some in today's group on balance in life.  She shared with the group participants that the two emotions that have consumed her life has been depression and anxiety.  She did not wish to participate further in the group discussion regarding signs/triggers in her life that signal being out of balance and two ways in which she could set boundaries in order to achieve balance in her life.    Therapeutic Modalities:   Cognitive Behavioral Therapy Solution-Focused Therapy Assertiveness Training  Alease FrameSonya S Taisha Pennebaker, Kentucky[place]CS[place] 06/05/2018 4:05 PM

## 2018-06-06 ENCOUNTER — Inpatient Hospital Stay: Payer: BLUE CROSS/BLUE SHIELD | Admitting: Anesthesiology

## 2018-06-06 ENCOUNTER — Other Ambulatory Visit: Payer: Self-pay | Admitting: Psychiatry

## 2018-06-06 ENCOUNTER — Inpatient Hospital Stay: Payer: BLUE CROSS/BLUE SHIELD

## 2018-06-06 LAB — GLUCOSE, CAPILLARY: Glucose-Capillary: 105 mg/dL — ABNORMAL HIGH (ref 70–99)

## 2018-06-06 LAB — POCT PREGNANCY, URINE: Preg Test, Ur: NEGATIVE

## 2018-06-06 MED ORDER — KETOROLAC TROMETHAMINE 30 MG/ML IJ SOLN
INTRAMUSCULAR | Status: AC
  Filled 2018-06-06: qty 1

## 2018-06-06 MED ORDER — ESMOLOL HCL 100 MG/10ML IV SOLN
INTRAVENOUS | Status: DC | PRN
  Administered 2018-06-06: 30 mg via INTRAVENOUS

## 2018-06-06 MED ORDER — FENTANYL CITRATE (PF) 100 MCG/2ML IJ SOLN: 25.0000 ug | INTRAMUSCULAR | Status: DC | PRN

## 2018-06-06 MED ORDER — KETOROLAC TROMETHAMINE 30 MG/ML IJ SOLN
30.0000 mg | Freq: Once | INTRAMUSCULAR | Status: AC
  Administered 2018-06-06: 30 mg via INTRAVENOUS

## 2018-06-06 MED ORDER — SODIUM CHLORIDE 0.9 % IV SOLN
500.0000 mL | Freq: Once | INTRAVENOUS | Status: AC
  Administered 2018-06-06: 500 mL via INTRAVENOUS

## 2018-06-06 MED ORDER — ONDANSETRON HCL 4 MG/2ML IJ SOLN: 4.0000 mg | Freq: Once | INTRAMUSCULAR | Status: DC | PRN

## 2018-06-06 MED ORDER — SODIUM CHLORIDE 0.9 % IV SOLN
INTRAVENOUS | Status: DC | PRN
  Administered 2018-06-06: 11:00:00 via INTRAVENOUS

## 2018-06-06 MED ORDER — METHOHEXITAL SODIUM 100 MG/10ML IV SOSY
PREFILLED_SYRINGE | INTRAVENOUS | Status: DC | PRN
  Administered 2018-06-06: 90 mg via INTRAVENOUS

## 2018-06-06 MED ORDER — GLYCOPYRROLATE 0.2 MG/ML IJ SOLN
0.1000 mg | Freq: Once | INTRAMUSCULAR | Status: AC
  Administered 2018-06-06: 0.1 mg via INTRAVENOUS

## 2018-06-06 MED ORDER — GLYCOPYRROLATE 0.2 MG/ML IJ SOLN
INTRAMUSCULAR | Status: AC
  Administered 2018-06-06: 0.1 mg via INTRAVENOUS
  Filled 2018-06-06: qty 1

## 2018-06-06 MED ORDER — LABETALOL HCL 5 MG/ML IV SOLN
INTRAVENOUS | Status: DC | PRN
  Administered 2018-06-06: 10 mg via INTRAVENOUS

## 2018-06-06 MED ORDER — SUCCINYLCHOLINE CHLORIDE 20 MG/ML IJ SOLN
INTRAMUSCULAR | Status: DC | PRN
  Administered 2018-06-06: 100 mg via INTRAVENOUS

## 2018-06-06 NOTE — [facility] Group Notes (Signed)
06/06/2018 1PM  Type of Therapy and Topic:  Group Therapy:  Feelings around Relapse and Recovery  Participation Level:  Did Not Attend   Description of Group:    Patients in this group will discuss emotions they experience before and after a relapse. They will process how experiencing these feelings, or avoidance of experiencing them, relates to having a relapse. Facilitator will guide patients to explore emotions they have related to recovery. Patients will be encouraged to process which emotions are more powerful. They will be guided to discuss the emotional reaction significant others in their lives may have to patients' relapse or recovery. Patients will be assisted in exploring ways to respond to the emotions of others without this contributing to a relapse.  Therapeutic Goals: 1. Patient will identify two or more emotions that lead to a relapse for them 2. Patient will identify two emotions that result when they relapse 3. Patient will identify two emotions related to recovery 4. Patient will demonstrate ability to communicate their needs through discussion and/or role plays   Summary of Patient Progress: Patient was encouraged and invited to attend group. Patient did not attend group. Social worker will continue to encourage group participation in the future.      Therapeutic Modalities:   Cognitive Behavioral Therapy Solution-Focused Therapy Assertiveness Training Relapse Prevention Therapy   [name]  Delfina Schreurs, LCSW 06/06/2018 2:20 PM

## 2018-06-06 NOTE — Consult Note (Signed)
ECT: Patient had right unilateral ECT treatment this morning.  Treatment was tolerated without complication.  Patient is ambivalent about whether she is feeling any better but multiple observers noticed that she is smiling a little more easily talks a little more coherently and loudly and has been interacting more on the unit.  Passive suicidal thoughts with no intent or plan.  As far as ECT treatment plan to continue course of treatment with next treatment on Monday after which if she is stable and safe we are likely to recommend discharge if the primary treatment team is agreeable to that.

## 2018-06-06 NOTE — Transfer of Care (Signed)
Immediate Anesthesia Transfer of Care Note  Patient: Renee Turner  Procedure(s) Performed: ECT TX  Patient Location: PACU  Anesthesia Type:General  Level of Consciousness: sedated  Airway & Oxygen Therapy: Patient Spontanous Breathing and Patient connected to face mask oxygen  Post-op Assessment: Report given to RN and Post -op Vital signs reviewed and stable  Post vital signs: Reviewed and stable  Last Vitals:  Vitals Value Taken Time  BP    Temp    Pulse 92 06/06/2018 11:02 AM  Resp 9 06/06/2018 11:02 AM  SpO2 100 % 06/06/2018 11:02 AM  Vitals shown include unvalidated device data.  Last Pain:  Vitals:   06/06/18 0824  TempSrc: Oral  PainSc:          Complications: No apparent anesthesia complications

## 2018-06-06 NOTE — Procedures (Signed)
ECT SERVICES Physician's Interval Evaluation & Treatment Note  Patient Identification: Renee Turner MRN:  [id number] Date of Evaluation:  06/06/2018 TX #: [id number]  MADRS:   MMSE:   P.E. Findings:  No change to physical exam  Psychiatric Interval Note:  Patient continues to be depressed although she looks slightly more active  Subjective:  Patient is a 22 y.o. female seen for evaluation for Electroconvulsive Therapy. Still feeling depressed  Treatment Summary:   _0   Right Unilateral             _1  Bilateral   % Energy : 0.3 ms 50%   Impedance: 1780 ohms  Seizure Energy Index: 15,033 V squared  Postictal Suppression Index: 88%  Seizure Concordance Index: 99%  Medications  Pre Shock: Robinul 0.1 mg labetalol 10 mg esmolol 30 mg Toradol 30 mg Brevital 90 mg succinylcholine 100 mg  Post Shock:    Seizure Duration: 24 seconds EMG 41 seconds EEG   Comments: Follow-up Monday  Lungs:  _2   Clear to auscultation               _3  Other:   Heart:    _4   Regular rhythm             _5  irregular rhythm    _6   Previous H&P reviewed, patient examined and there are NO CHANGES                 _7   Previous H&P reviewed, patient examined and there are changes noted.   Alethia Berthold, MD 8/2/201911:10 AM

## 2018-06-06 NOTE — Anesthesia Post-op Follow-up Note (Signed)
Anesthesia QCDR form completed.        

## 2018-06-06 NOTE — H&P (Signed)
Renee Turner is an 22 y.o. female.   Chief Complaint: Continues to be depressed down and negative with passive suicidal ideation. HPI: History of severe depression and PTSD and suicidal thinking  Past Medical History:  Diagnosis Date  . Anxiety   . Depression   . Diabetes mellitus without complication ([place])    Type II  . Diabetes mellitus, type II ([place])     History reviewed. No pertinent surgical history.  History reviewed. No pertinent family history. Social History:  reports that she has never smoked. She has never used smokeless tobacco. She reports that she drinks alcohol. She reports that she does not use drugs.  Allergies: No Known Allergies  Medications Prior to Admission  Medication Sig Dispense Refill  . ferrous sulfate 325 (65 FE) MG tablet Take 1 tablet (325 mg total) by mouth daily with breakfast. (Patient not taking: Reported on 06/02/2018) 30 tablet 0  . FLUoxetine (PROZAC) 40 MG capsule Take 1 capsule (40 mg total) by mouth daily. (Patient not taking: Reported on 06/02/2018) 30 capsule 0  . gabapentin (NEURONTIN) 300 MG capsule Take 1 capsule (300 mg total) by mouth 3 (three) times daily. (Patient not taking: Reported on 06/02/2018) 90 capsule 0  . metFORMIN (FORTAMET) 1000 MG (OSM) 24 hr tablet Take 1 tablet (1,000 mg total) by mouth 2 (two) times daily with a meal. (Patient not taking: Reported on 06/02/2018) 60 tablet 0  . protriptyline (VIVACTIL) 10 MG tablet Take 1 tablet (10 mg total) by mouth 3 (three) times daily. Narcolepsy (Patient not taking: Reported on 06/02/2018) 90 tablet 0  . traZODone (DESYREL) 50 MG tablet Take 1 tablet (50 mg total) by mouth at bedtime. Take 2 hours before bedtime: For sleep (Patient not taking: Reported on 06/02/2018) 30 tablet 0  . traZODone (DESYREL) 50 MG tablet Take 1 tablet (50 mg total) by mouth at bedtime as needed for sleep. (Patient not taking: Reported on 06/02/2018) 30 tablet 0    Results for orders placed or performed  during the hospital encounter of 06/02/18 (from the past 48 hour(s))  Glucose, capillary     Status: Abnormal   Collection Time: 06/06/18  6:30 AM  Result Value Ref Range   Glucose-Capillary 105 (H) 70 - 99 mg/dL  Pregnancy, urine POC     Status: None   Collection Time: 06/06/18  9:09 AM  Result Value Ref Range   Preg Test, Ur NEGATIVE NEGATIVE    Comment:        THE SENSITIVITY OF THIS METHODOLOGY IS >24 mIU/mL    No results found.  Review of Systems  Constitutional: Negative.   HENT: Negative.   Eyes: Negative.   Respiratory: Negative.   Cardiovascular: Negative.   Gastrointestinal: Negative.   Musculoskeletal: Negative.   Skin: Negative.   Neurological: Negative.   Psychiatric/Behavioral: Positive for depression and suicidal ideas. Negative for hallucinations, memory loss and substance abuse. The patient is not nervous/anxious and does not have insomnia.     Blood pressure 121/70, pulse 93, temperature (!) 97.5 F (36.4 C), resp. rate 16, height _0  (1.676 m), weight 90.3 kg (199 lb), last menstrual period 05/29/2018, SpO2 100 %. Physical Exam  Nursing note and vitals reviewed. Constitutional: She appears well-developed and well-nourished.  HENT:  Head: Normocephalic and atraumatic.  Eyes: Pupils are equal, round, and reactive to light. Conjunctivae are normal.  Neck: Normal range of motion.  Cardiovascular: Regular rhythm and normal heart sounds.  Respiratory: Effort normal. No respiratory distress.  GI: Soft.  Musculoskeletal: Normal range of motion.  Neurological: She is alert.  Skin: Skin is warm and dry.  Psychiatric: Judgment normal. Her affect is blunt. Her speech is delayed. She is slowed and withdrawn. Cognition and memory are normal. She expresses suicidal ideation. She expresses no suicidal plans.     Assessment/Plan Patient looks like she may be slightly more energetic.  Continue ECT treatment with next treatment on Monday  Alethia Berthold,  MD 06/06/2018, 11:09 AM

## 2018-06-06 NOTE — Progress Notes (Signed)
D: Pt denies SI/HI/AVH, affect is flat but brightens upon approach. Patient is pleasant and cooperative, with treatment plan. Patient stated she looks forwards to her doing ECT tomorrow. Patient appears less anxious and she is interacting with peers and staff appropriately.  A: Pt was offered support and encouragement, and was given scheduled medications. In addition patient was encouraged to attend groups. 15 minute checks were done for safety.  R:Pt attends groups and interacts appropriately with peers and staff, she is compliant with medication, and receptive to treatment. Safety maintained on unit, will continue to monitor.

## 2018-06-06 NOTE — Anesthesia Postprocedure Evaluation (Addendum)
Anesthesia Post Note  Patient: Renee Turner  Procedure(s) Performed: ECT TX  Patient location during evaluation: PACU Anesthesia Type: General Level of consciousness: awake and alert Pain management: pain level controlled Vital Signs Assessment: post-procedure vital signs reviewed and stable Respiratory status: spontaneous breathing, nonlabored ventilation, respiratory function stable and patient connected to nasal cannula oxygen Cardiovascular status: blood pressure returned to baseline and stable Postop Assessment: no apparent nausea or vomiting Anesthetic complications: no     Last Vitals:  Vitals:   06/06/18 1124 06/06/18 1134  BP: 113/78 112/75  Pulse: 91 85  Resp: 19 (!) 24  Temp:  (!) 36 C  SpO2: 98% 99%    Last Pain:  Vitals:   06/06/18 1417  TempSrc:   PainSc: 0-No pain                  G      

## 2018-06-06 NOTE — Plan of Care (Signed)
Patient tolerated ECT.Patient rated her depression 3/10.Patient smiles and talks more loud now.Denies SI,HI and AVH.Compliant with medications.Appetite and energy level good.Did not attend groups.Support and encouragement given.

## 2018-06-06 NOTE — Anesthesia Preprocedure Evaluation (Signed)
Anesthesia Evaluation  Patient identified by MRN, date of birth, ID band Patient awake    Reviewed: Allergy & Precautions, H&P , NPO status , Patient's Chart, lab work & pertinent test results, reviewed documented beta blocker date and time   Airway Mallampati: II   Neck ROM: full    Dental  (+) Poor Dentition   Pulmonary neg pulmonary ROS,    Pulmonary exam normal        Cardiovascular negative cardio ROS Normal cardiovascular exam Rhythm:regular Rate:Normal     Neuro/Psych negative neurological ROS  negative psych ROS   GI/Hepatic negative GI ROS, Neg liver ROS,   Endo/Other  negative endocrine ROSdiabetes  Renal/GU negative Renal ROS  negative genitourinary   Musculoskeletal   Abdominal   Peds  Hematology negative hematology ROS (+)   Anesthesia Other Findings Past Medical History: No date: Anxiety No date: Depression No date: Diabetes mellitus without complication ([name])     Comment:  Type II No date: Diabetes mellitus, type II ([name]) History reviewed. No pertinent surgical history. BMI    Body Mass Index:  32.12 kg/m     Reproductive/Obstetrics negative OB ROS                             Anesthesia Physical Anesthesia Plan  ASA: II  Anesthesia Plan: General   Post-op Pain Management:    Induction:   PONV Risk Score and Plan:   Airway Management Planned:   Additional Equipment:   Intra-op Plan:   Post-operative Plan:   Informed Consent: I have reviewed the patients History and Physical, chart, labs and discussed the procedure including the risks, benefits and alternatives for the proposed anesthesia with the patient or authorized representative who has indicated his/her understanding and acceptance.   Dental Advisory Given  Plan Discussed with: CRNA  Anesthesia Plan Comments:         Anesthesia Quick Evaluation

## 2018-06-07 MED ORDER — DOCUSATE SODIUM 100 MG PO CAPS
100.0000 mg | ORAL_CAPSULE | Freq: Two times a day (BID) | ORAL | Status: DC
  Administered 2018-06-07 – 2018-06-09 (×4): 100 mg via ORAL
  Filled 2018-06-07 (×4): qty 1

## 2018-06-07 MED ORDER — MAGNESIUM CITRATE PO SOLN
1.0000 | Freq: Once | ORAL | Status: AC
  Administered 2018-06-07: 1 via ORAL
  Filled 2018-06-07: qty 296

## 2018-06-07 NOTE — Progress Notes (Signed)
D: Patient stated slept good last night .Stated appetite fair  and energy level normal. Stated concentration  good . Stated on Depression scale 3 , hopeless 3 and anxiety 3 .( low 0-10 high) Denies suicidal  homicidal ideations  .  No auditory hallucinations  No pain concerns . Appropriate ADL'S. Interacting with peers and staff. Emotional and Mental status  slight improvement .  Noted to smile  during conversation. Limited interaction with peers but  out in milieu  more. Patient continue the resource of ECT for her depression. No safety concerns . Patient working on coping  issues   Patient knowledgeable of information received Which is given in concrete for for better  understanding . Goal for today  Try to get out of my room more .  A: Encourage patient participation with unit programming . Instruction  Given on  Medication , verbalize understanding.  R: Voice no other concerns. Staff continue to monitor

## 2018-06-07 NOTE — [facility] Group Notes (Signed)
LCSW Group Therapy Note  06/07/2018 1:15pm  Type of Therapy and Topic: Group Therapy: Holding on to Grudges   Participation Level: Did Not Attend   Description of Group:  In this group patients will be asked to explore and define a grudge. Patients will be guided to discuss their thoughts, feelings, and reasons as to why people have grudges. Patients will process the impact grudges have on daily life and identify thoughts and feelings related to holding grudges. Facilitator will challenge patients to identify ways to let go of grudges and the benefits this provides. Patients will be confronted to address why one struggles letting go of grudges. Lastly, patients will identify feelings and thoughts related to what life would look like without grudges. This group will be process-oriented, with patients participating in exploration of their own experiences, giving and receiving support, and processing challenge from other group members.  Therapeutic Goals:  1. Patient will identify specific grudges related to their personal life.  2. Patient will identify feelings, thoughts, and beliefs around grudges.  3. Patient will identify how one releases grudges appropriately.  4. Patient will identify situations where they could have let go of the grudge, but instead chose to hold on.   Summary of Patient Progress: Pt was invited to attend group but chose not to attend. CSW will continue to encourage pt to attend group throughout their admission.    Therapeutic Modalities:  Cognitive Behavioral Therapy  Solution Focused Therapy  Motivational Interviewing  Brief Therapy   Larell Baney  CUEBAS-COLON, LCSW 06/07/2018 12:44 PM  

## 2018-06-07 NOTE — Plan of Care (Signed)
Emotional and Mental status  slight improvement .  Noted to smile  during conversation. Limited interaction with peers but  out in milieu  more. Patient continue the resource of ECT for her depression. No safety concerns . Patient working on coping  issues   Nlo group attendance today Patient knowledgeable of information received Which is given in concrete for for better  understanding .  Problem: Education: Goal: Knowledge of [facility] Education information/materials will improve Outcome: Progressing Goal: Emotional status will improve Outcome: Progressing Goal: Mental status will improve Outcome: Progressing Goal: Verbalization of understanding the information provided will improve Outcome: Progressing   Problem: Health Behavior/Discharge Planning: Goal: Identification of resources available to assist in meeting health care needs will improve Outcome: Progressing Goal: Compliance with treatment plan for underlying cause of condition will improve Outcome: Progressing   Problem: Safety: Goal: Periods of time without injury will increase Outcome: Progressing   Problem: Coping: Goal: Coping ability will improve Outcome: Progressing Goal: Will verbalize feelings Outcome: Progressing   Problem: Role Relationship: Goal: Will demonstrate positive changes in social behaviors and relationships Outcome: Progressing   Problem: Self-Concept: Goal: Will verbalize positive feelings about self Outcome: Progressing Goal: Level of anxiety will decrease Outcome: Progressing

## 2018-06-07 NOTE — Plan of Care (Addendum)
Patient found sitting on bed in room upon my arrival. Patient is neither visible nor social this evening. Patient is isolative and minimally verbal during assessment. Reports mood is "fine." Denies all complaints including SI/HI/AVH. Denies pain. Reports eating and voiding adequately. Compliant with HS medication and staff direction. Q 15 minute checks maintained. Will continue to monitor throughout the shift. Patient slept 8.5 hours. No apparent distress. Will endorse care to oncoming shift.  Problem: Education: Goal: Knowledge of [facility] Education information/materials will improve Outcome: Progressing Goal: Emotional status will improve Outcome: Progressing Goal: Mental status will improve Outcome: Progressing Goal: Verbalization of understanding the information provided will improve Outcome: Progressing   Problem: Health Behavior/Discharge Planning: Goal: Compliance with treatment plan for underlying cause of condition will improve Outcome: Progressing   Problem: Coping: Goal: Coping ability will improve Outcome: Progressing   Problem: Self-Concept: Goal: Level of anxiety will decrease Outcome: Progressing

## 2018-06-07 NOTE — Progress Notes (Signed)
[facility] MD Progress Note  06/07/2018 1:20 PM Renee Turner  MRN:  [id number] Subjective: Follow-up for this 22 year old woman with depression and PTSD who has been receiving electroconvulsive therapy.  Patient seen chart reviewed.  Blood sugars are normal.  Vitals are normal.  Patient has no new physical complaints.  Patient in conversation with me continues to be withdrawn.  She is ambivalent about whether her mood is any better.  After thinking about it she tells me she does not think she is having any active suicidal thoughts.  Patient is noted to be more outgoing and talkative with women.  Nurses reports that she looks a little better although as always she is withdrawn.  Patient is not aware of having any cognitive side effects Principal Problem: Severe recurrent major depression without psychotic features ([place]) Diagnosis:   Patient Active Problem List   Diagnosis Date Noted  . Severe recurrent major depression without psychotic features ([place]) [F33.2] 05/14/2018  . MDD (major depressive disorder), recurrent severe, without psychosis ([place]) [F33.2] 05/06/2018  . 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 Posttraumatic stress disorder [F43.10] 11/13/2016  . Severe episode of recurrent major depressive disorder, without psychotic features ([place]) [F33.2] 11/07/2016  . Diabetes ([place]) [E11.9] 11/07/2016  . Diabetic neuropathy associated with type 2 diabetes mellitus ([place]) [E11.40] 07/19/2016  . Obesity (BMI 30.0-34.9) [E66.9] 07/19/2016  . Type 2 diabetes mellitus with hyperglycemia, without long-term current use of insulin ([name]) [E11.65] 07/19/2016   Total Time spent with patient: 30 minutes  Past Psychiatric History: Long history of depression and PTSD with suicidality and poor response to medicine  Past Medical History:  Past Medical History:  Diagnosis Date  . Anxiety   . Depression   . Diabetes mellitus without complication ([place])    Type II  . Diabetes mellitus, type II ([place])     History reviewed. No pertinent surgical history. Family History: History reviewed. No pertinent family history. Family Psychiatric  History: See previous notes Social History:  Social History   Substance and Sexual Activity  Alcohol Use Yes     Social History   Substance and Sexual Activity  Drug Use No    Social History   Socioeconomic History  . Marital status: Single    Spouse name: Not on file  . Number of children: Not on file  . Years of education: Not on file  . Highest education level: Not on file  Occupational History  . Not on file  Social Needs  . Financial resource strain: Not on file  . Food insecurity:    Worry: Not on file    Inability: Not on file  . Transportation needs:    Medical: Not on file    Non-medical: Not on file  Tobacco Use  . Smoking status: Never Smoker  . Smokeless tobacco: Never Used  Substance and Sexual Activity  . Alcohol use: Yes  . Drug use: No  . Sexual activity: Yes    Birth control/protection: None  Lifestyle  . Physical activity:    Days per week: Not on file    Minutes per session: Not on file  . Stress: Not on file  Relationships  . Social connections:    Talks on phone: Not on file    Gets together: Not on file    Attends religious service: Not on file    Active member of club or organization: Not on file    Attends meetings of clubs or organizations: Not on file  Relationship status: Not on file  Other Topics Concern  . Not on file  Social History Narrative  . Not on file   Additional Social History:                         Sleep: Fair  Appetite:  Fair  Current Medications: Current Facility-Administered Medications  Medication Dose Route Frequency Provider Last Rate Last Dose  . acetaminophen (TYLENOL) tablet 650 mg  650 mg Oral Q6H PRN Quandra Fedorchak T, MD      . alum & mag hydroxide-simeth (MAALOX/MYLANTA) 200-200-20 MG/5ML suspension 30 mL  30 mL Oral Q4H PRN Joylene Wescott T, MD      .  docusate sodium (COLACE) capsule 100 mg  100 mg Oral BID Ameerah Huffstetler T, MD      . fentaNYL (SUBLIMAZE) injection 25 mcg  25 mcg Intravenous Q5 min PRN Molli Barrows, MD      . ferrous sulfate tablet 325 mg  325 mg Oral Q breakfast Caroline Matters, Madie Reno, MD   325 mg at 06/07/18 0744  . FLUoxetine (PROZAC) capsule 40 mg  40 mg Oral Daily Aldine Grainger, Madie Reno, MD   40 mg at 06/07/18 0744  . gabapentin (NEURONTIN) capsule 300 mg  300 mg Oral TID Giancarlos Berendt, Madie Reno, MD   300 mg at 06/07/18 1139  . magnesium citrate solution 1 Bottle  1 Bottle Oral Once Ophie Burrowes T, MD      . magnesium hydroxide (MILK OF MAGNESIA) suspension 30 mL  30 mL Oral Daily PRN Nayelie Gionfriddo, Madie Reno, MD   30 mL at 06/05/18 2150  . metFORMIN (GLUCOPHAGE-XR) 24 hr tablet 1,000 mg  1,000 mg Oral BID WC Korvin Valentine, Madie Reno, MD   1,000 mg at 06/07/18 0744  . midazolam (VERSED) injection 2 mg  2 mg Intravenous Once Lucius Wise T, MD      . ondansetron (ZOFRAN) injection 4 mg  4 mg Intravenous Once PRN Molli Barrows, MD      . protriptyline (VIVACTIL) tablet 10 mg  10 mg Oral TID Johnye Kist T, MD      . traZODone (DESYREL) tablet 50 mg  50 mg Oral QHS Mackinze Criado, Madie Reno, MD   50 mg at 06/06/18 1943  . traZODone (DESYREL) tablet 50 mg  50 mg Oral QHS PRN Brice Kossman, Madie Reno, MD        Lab Results:  Results for orders placed or performed during the hospital encounter of 06/02/18 (from the past 48 hour(s))  Glucose, capillary     Status: Abnormal   Collection Time: 06/06/18  6:30 AM  Result Value Ref Range   Glucose-Capillary 105 (H) 70 - 99 mg/dL  Pregnancy, urine POC     Status: None   Collection Time: 06/06/18  9:09 AM  Result Value Ref Range   Preg Test, Ur NEGATIVE NEGATIVE    Comment:        THE SENSITIVITY OF THIS METHODOLOGY IS >24 mIU/mL     Blood Alcohol level:  Lab Results  Component Value Date   ETH <5 97/12/6376    Metabolic Disorder Labs: Lab Results  Component Value Date   HGBA1C 6.5 (H) 05/07/2018   MPG 139.85  05/07/2018   No results found for: PROLACTIN Lab Results  Component Value Date   CHOL 181 05/07/2018   TRIG 92 05/07/2018   HDL 46 05/07/2018   CHOLHDL 3.9 05/07/2018   VLDL 18 05/07/2018   LDLCALC  117 (H) 05/07/2018    Physical Findings: AIMS: Facial and Oral Movements Muscles of Facial Expression: None, normal, ,  ,  ,    CIWA:    COWS:     Musculoskeletal: Strength & Muscle Tone: within normal limits Gait & Station: normal Patient leans: N/A  Psychiatric Specialty Exam: Physical Exam  Nursing note and vitals reviewed. Constitutional: She appears well-developed and well-nourished.  HENT:  Head: Normocephalic and atraumatic.  Eyes: Pupils are equal, round, and reactive to light. Conjunctivae are normal.  Neck: Normal range of motion.  Cardiovascular: Regular rhythm and normal heart sounds.  Respiratory: Effort normal. No respiratory distress.  GI: Soft.  Musculoskeletal: Normal range of motion.  Neurological: She is alert.  Skin: Skin is warm and dry.  Psychiatric: Judgment normal. Her affect is blunt. Her speech is delayed. She is slowed and withdrawn. Thought content is not paranoid. Cognition and memory are normal. She expresses no suicidal ideation.    Review of Systems  Constitutional: Negative.   HENT: Negative.   Eyes: Negative.   Respiratory: Negative.   Cardiovascular: Negative.   Gastrointestinal: Negative.   Musculoskeletal: Negative.   Skin: Negative.   Neurological: Negative.   Psychiatric/Behavioral: Positive for depression. Negative for hallucinations, substance abuse and suicidal ideas. The patient is nervous/anxious. The patient does not have insomnia.     Blood pressure 100/66, pulse 98, temperature 98.5 F (36.9 C), temperature source Oral, resp. rate 18, height '5\' 6"'  (1.676 m), weight 90.3 kg (199 lb), last menstrual period 05/29/2018, SpO2 100 %.Body mass index is 32.12 kg/m.  General Appearance: Fairly Groomed  Eye Contact:  Minimal   Speech:  Slow  Volume:  Decreased  Mood:  Euthymic  Affect:  Constricted  Thought Process:  Coherent  Orientation:  Full (Time, Place, and Person)  Thought Content:  Logical  Suicidal Thoughts:  No  Homicidal Thoughts:  No  Memory:  Immediate;   Fair Recent;   Fair Remote;   Fair  Judgement:  Fair  Insight:  Fair  Psychomotor Activity:  Decreased  Concentration:  Concentration: Fair  Recall:  [employer] of Knowledge:  Fair  Language:  Fair  Akathisia:  No  Handed:  Right  AIMS (if indicated):     Assets:  Desire for Improvement Housing Physical Health Resilience  ADL's:  Intact  Cognition:  Impaired,  Mild  Sleep:  Number of Hours: 8.5     Treatment Plan Summary: Daily contact with patient to assess and evaluate symptoms and progress in treatment, Medication management and Plan Patient is hard to assess as usual.  She is denying any active suicidal ideation and she does appear to take care of her hygiene.  Blood sugars are stable.  She talks a little bit more with female staff members and has acknowledge that her home life is a major stress.  I reviewed with her the plan to have ECT again on Monday after which we may consider discharge.  No change to medication for today.  Patient to me seems to have a little bit of short-term memory loss although she denies any awareness of it.  Renee Berthold, MD 06/07/2018, 1:20 PM

## 2018-06-08 ENCOUNTER — Other Ambulatory Visit: Payer: Self-pay | Admitting: Psychiatry

## 2018-06-08 NOTE — Plan of Care (Addendum)
Patient found un common area upon my arrival. Patient is visible but not social this evening. Affect is brighter, more animated. Remains minimally verbal. Denies all complaints including SI/HI/AVH. Reports eating and voiding adequately. Compliant with HS medication and staff direction. Educated regarding NPO after midnight status. Verbalized understanding. Q 15 minute checks maintained. Will continue to monitor throughout the shift. Patient slept 7.75 hours. No apparent distress. Will endorse care to oncoming shift. Compliant with CBG 101. Compliant with urine sample.  Problem: Education: Goal: Emotional status will improve Outcome: Progressing Goal: Mental status will improve Outcome: Progressing   Problem: Safety: Goal: Periods of time without injury will increase Outcome: Progressing   Problem: Coping: Goal: Coping ability will improve Outcome: Progressing   Problem: Role Relationship: Goal: Will demonstrate positive changes in social behaviors and relationships Outcome: Progressing   Problem: Self-Concept: Goal: Level of anxiety will decrease Outcome: Progressing   Problem: Coping: Goal: Will verbalize feelings Outcome: Not Progressing

## 2018-06-08 NOTE — Plan of Care (Signed)
Patient is alert and oriented; denies SI, HI and AVH. Patient affect is flat but pleasant. Patient seems to be blocking. Patient interaction today is minimal with staff and peers. When asked about depression and how she is feeling, patient states " "I don't know if I feel a difference yet ."  Patient is compliant with medication and eating meals regularly. Patient has no pain 0/10. No complaints at this time. Nurse will continue to monitor. Problem: Education: Goal: Knowledge of [facility] Education information/materials will improve Outcome: Progressing Goal: Emotional status will improve Outcome: Not Progressing Goal: Mental status will improve Outcome: Not Progressing Goal: Verbalization of understanding the information provided will improve Outcome: Progressing   Problem: Health Behavior/Discharge Planning: Goal: Identification of resources available to assist in meeting health care needs will improve Outcome: Progressing Goal: Compliance with treatment plan for underlying cause of condition will improve Outcome: Progressing

## 2018-06-08 NOTE — Progress Notes (Signed)
[facility] MD Progress Note  06/08/2018 1:19 PM Renee Turner  MRN:  [id number] Subjective: Follow-up for this patient with severe depression.  Patient reports she is feeling okay.  Denies suicidal thoughts.  Denies hallucinations.  Presentation is similar to usual.  Very quiet speech minimal eye contact.  Speaks to nurses more than she talks with me.  Patient asked me to fill out her FMLA paperwork and says she is hoping to go back to work "in the next few days" Principal Problem: Severe recurrent major depression without psychotic features ([place]) Diagnosis:   Patient Active Problem List   Diagnosis Date Noted  . Severe recurrent major depression without psychotic features ([place]) [F33.2] 05/14/2018  . MDD (major depressive disorder), recurrent severe, without psychosis ([place]) [F33.2] 05/06/2018  . 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 Posttraumatic stress disorder [F43.10] 11/13/2016  . Severe episode of recurrent major depressive disorder, without psychotic features ([place]) [F33.2] 11/07/2016  . Diabetes ([place]) [E11.9] 11/07/2016  . Diabetic neuropathy associated with type 2 diabetes mellitus ([place]) [E11.40] 07/19/2016  . Obesity (BMI 30.0-34.9) [E66.9] 07/19/2016  . Type 2 diabetes mellitus with hyperglycemia, without long-term current use of insulin ([name]) [E11.65] 07/19/2016   Total Time spent with patient: 20 minutes  Past Psychiatric History: Patient has a long history of depression and PTSD.  Sees a therapist outside.  Has been getting ECT with possibly some benefit.  Past Medical History:  Past Medical History:  Diagnosis Date  . Anxiety   . Depression   . Diabetes mellitus without complication ([place])    Type II  . Diabetes mellitus, type II ([place])    History reviewed. No pertinent surgical history. Family History: History reviewed. No pertinent family history. Family Psychiatric  History: Unknown Social History:  Social History   Substance and Sexual Activity  Alcohol  Use Yes     Social History   Substance and Sexual Activity  Drug Use No    Social History   Socioeconomic History  . Marital status: Single    Spouse name: Not on file  . Number of children: Not on file  . Years of education: Not on file  . Highest education level: Not on file  Occupational History  . Not on file  Social Needs  . Financial resource strain: Not on file  . Food insecurity:    Worry: Not on file    Inability: Not on file  . Transportation needs:    Medical: Not on file    Non-medical: Not on file  Tobacco Use  . Smoking status: Never Smoker  . Smokeless tobacco: Never Used  Substance and Sexual Activity  . Alcohol use: Yes  . Drug use: No  . Sexual activity: Yes    Birth control/protection: None  Lifestyle  . Physical activity:    Days per week: Not on file    Minutes per session: Not on file  . Stress: Not on file  Relationships  . Social connections:    Talks on phone: Not on file    Gets together: Not on file    Attends religious service: Not on file    Active member of club or organization: Not on file    Attends meetings of clubs or organizations: Not on file    Relationship status: Not on file  Other Topics Concern  . Not on file  Social History Narrative  . Not on file   Additional Social History:  Sleep: Fair  Appetite:  Fair  Current Medications: Current Facility-Administered Medications  Medication Dose Route Frequency Provider Last Rate Last Dose  . acetaminophen (TYLENOL) tablet 650 mg  650 mg Oral Q6H PRN Clapacs, John T, MD      . alum & mag hydroxide-simeth (MAALOX/MYLANTA) 200-200-20 MG/5ML suspension 30 mL  30 mL Oral Q4H PRN Clapacs, John T, MD      . docusate sodium (COLACE) capsule 100 mg  100 mg Oral BID Clapacs, Madie Reno, MD   100 mg at 06/08/18 0834  . fentaNYL (SUBLIMAZE) injection 25 mcg  25 mcg Intravenous Q5 min PRN Molli Barrows, MD      . ferrous sulfate tablet 325 mg  325 mg  Oral Q breakfast Clapacs, Madie Reno, MD   325 mg at 06/08/18 0830  . FLUoxetine (PROZAC) capsule 40 mg  40 mg Oral Daily Clapacs, Madie Reno, MD   40 mg at 06/08/18 0829  . gabapentin (NEURONTIN) capsule 300 mg  300 mg Oral TID Clapacs, Madie Reno, MD   300 mg at 06/08/18 1158  . magnesium hydroxide (MILK OF MAGNESIA) suspension 30 mL  30 mL Oral Daily PRN Clapacs, Madie Reno, MD   30 mL at 06/05/18 2150  . metFORMIN (GLUCOPHAGE-XR) 24 hr tablet 1,000 mg  1,000 mg Oral BID WC Clapacs, Madie Reno, MD   1,000 mg at 06/08/18 0829  . midazolam (VERSED) injection 2 mg  2 mg Intravenous Once Clapacs, John T, MD      . ondansetron (ZOFRAN) injection 4 mg  4 mg Intravenous Once PRN Molli Barrows, MD      . protriptyline (VIVACTIL) tablet 10 mg  10 mg Oral TID Clapacs, John T, MD      . traZODone (DESYREL) tablet 50 mg  50 mg Oral QHS Clapacs, Madie Reno, MD   50 mg at 06/07/18 2000  . traZODone (DESYREL) tablet 50 mg  50 mg Oral QHS PRN Clapacs, Madie Reno, MD        Lab Results: No results found for this or any previous visit (from the past 48 hour(s)).  Blood Alcohol level:  Lab Results  Component Value Date   ETH <5 75/88/3254    Metabolic Disorder Labs: Lab Results  Component Value Date   HGBA1C 6.5 (H) 05/07/2018   MPG 139.85 05/07/2018   No results found for: PROLACTIN Lab Results  Component Value Date   CHOL 181 05/07/2018   TRIG 92 05/07/2018   HDL 46 05/07/2018   CHOLHDL 3.9 05/07/2018   VLDL 18 05/07/2018   LDLCALC 117 (H) 05/07/2018    Physical Findings: AIMS: Facial and Oral Movements Muscles of Facial Expression: None, normal, ,  ,  ,    CIWA:    COWS:     Musculoskeletal: Strength & Muscle Tone: within normal limits Gait & Station: normal Patient leans: N/A  Psychiatric Specialty Exam: Physical Exam  Nursing note and vitals reviewed. Constitutional: She appears well-developed and well-nourished.  HENT:  Head: Normocephalic and atraumatic.  Eyes: Pupils are equal, round, and reactive  to light. Conjunctivae are normal.  Neck: Normal range of motion.  Cardiovascular: Regular rhythm and normal heart sounds.  Respiratory: Effort normal.  GI: Soft.  Musculoskeletal: Normal range of motion.  Neurological: She is alert.  Skin: Skin is warm and dry.  Psychiatric: Judgment normal. Her affect is blunt. Her speech is delayed. She is slowed. Thought content is not paranoid. Cognition and memory are normal. She expresses no  homicidal and no suicidal ideation.    Review of Systems  Constitutional: Negative.   HENT: Negative.   Eyes: Negative.   Respiratory: Negative.   Cardiovascular: Negative.   Gastrointestinal: Negative.   Musculoskeletal: Negative.   Skin: Negative.   Neurological: Negative.   Psychiatric/Behavioral: Negative for depression, hallucinations, memory loss, substance abuse and suicidal ideas. The patient is not nervous/anxious and does not have insomnia.     Blood pressure 101/69, pulse (!) 131, temperature 98.2 F (36.8 C), temperature source Oral, resp. rate 18, height '5\' 6"'  (1.676 m), weight 90.3 kg (199 lb), last menstrual period 05/29/2018, SpO2 100 %.Body mass index is 32.12 kg/m.  General Appearance: Casual  Eye Contact:  Minimal  Speech:  Slow  Volume:  Decreased  Mood:  Euthymic  Affect:  Constricted  Thought Process:  Goal Directed  Orientation:  Full (Time, Place, and Person)  Thought Content:  Logical  Suicidal Thoughts:  No  Homicidal Thoughts:  No  Memory:  Immediate;   Fair Recent;   Fair Remote;   Fair  Judgement:  Fair  Insight:  Fair  Psychomotor Activity:  Normal  Concentration:  Concentration: Fair  Recall:  [employer] of Knowledge:  Fair  Language:  Fair  Akathisia:  No  Handed:  Right  AIMS (if indicated):     Assets:  Desire for Improvement Housing Physical Health  ADL's:  Intact  Cognition:  WNL  Sleep:  Number of Hours: 6.75     Treatment Plan Summary: Daily contact with patient to assess and evaluate symptoms  and progress in treatment, Medication management and Plan Patient is scheduled to have ECT again tomorrow but at that point her primary psychiatrist may consider discharge from the hospital.  I will complete her FMLA paperwork.  Counseled her about the crucial importance of taking care of herself taking care of her medication taking care of getting outpatient treatment when she leaves the hospital.  Talked about discharge planning around ECT.  Alethia Berthold, MD 06/08/2018, 1:19 PM

## 2018-06-08 NOTE — Plan of Care (Signed)
  Problem: Coping: Goal: Coping ability will improve 06/08/2018 0317 by Trula OreAjetunmobi, Rainelle Sulewski Abisola, RN Outcome: Progressing 06/08/2018 0317 by Trula OreAjetunmobi, Alyssa Rotondo Abisola, RN Outcome: Progressing  Patient interacting better with peers, attended evening group.

## 2018-06-08 NOTE — Progress Notes (Signed)
D:Pt denies SI/HI/AVH, affect is flat but brightens upon approach.Patientis pleasant and cooperative, with treatment plan, She stated she's doing better wih ECT and she appears less anxious and is interacting with peers and staff appropriately.  A:Pt was offered support and encouragement, and wasgiven scheduled medications. In addition patientwas encouraged to attend groups. 15 minute checks were done for safety.  R:Pt attends groups and interacts appropriately with peers and staff, she is compliant with medication, and receptive to treatment. Safety maintained on unit, will continue to monitor.

## 2018-06-08 NOTE — Plan of Care (Signed)
  Problem: Coping: Goal: Coping ability will improve Outcome: Progressing   

## 2018-06-08 NOTE — Discharge Summary (Signed)
Physician Discharge Summary Note  Patient:  Renee Turner is an 22 y.o., female MRN:  [id number] DOB:  Oct 12, 1996 Patient phone:  [phone] (home)  Patient address:   [place] [address],  Total Time spent with patient: 45 minutes  Date of Admission:  05/14/2018 Date of Discharge: May 30, 2018  Reason for Admission: Admitted by transfer from behavioral health Hospital for consideration of ECT  Principal Problem: Severe recurrent major depression without psychotic features [facility]) Discharge Diagnoses: Patient Active Problem List   Diagnosis Date Noted  . Severe recurrent major depression without psychotic features ([place]) [F33.2] 05/14/2018  . MDD (major depressive disorder), recurrent severe, without psychosis ([place]) [F33.2] 05/06/2018  . 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 Posttraumatic stress disorder [F43.10] 11/13/2016  . Severe episode of recurrent major depressive disorder, without psychotic features ([place]) [F33.2] 11/07/2016  . Diabetes ([place]) [E11.9] 11/07/2016  . Diabetic neuropathy associated with type 2 diabetes mellitus ([place]) [E11.40] 07/19/2016  . Obesity (BMI 30.0-34.9) [E66.9] 07/19/2016  . Type 2 diabetes mellitus with hyperglycemia, without long-term current use of insulin ([place]) [E11.65] 07/19/2016    Past Psychiatric History: History of PTSD and depression  Past Medical History:  Past Medical History:  Diagnosis Date  . Anxiety   . Depression   . Diabetes mellitus without complication ([place])    Type II  . Diabetes mellitus, type II ([place])    History reviewed. No pertinent surgical history. Family History: History reviewed. No pertinent family history. Family Psychiatric  History: See previous notes none known Social History:  Social History   Substance and Sexual Activity  Alcohol Use Yes     Social History   Substance and Sexual Activity  Drug Use No    Social History   Socioeconomic History  . Marital  status: Single    Spouse name: Not on file  . Number of children: Not on file  . Years of education: Not on file  . Highest education level: Not on file  Occupational History  . Not on file  Social Needs  . Financial resource strain: Not on file  . Food insecurity:    Worry: Not on file    Inability: Not on file  . Transportation needs:    Medical: Not on file    Non-medical: Not on file  Tobacco Use  . Smoking status: Never Smoker  . Smokeless tobacco: Never Used  Substance and Sexual Activity  . Alcohol use: Yes  . Drug use: No  . Sexual activity: Yes    Birth control/protection: None  Lifestyle  . Physical activity:    Days per week: Not on file    Minutes per session: Not on file  . Stress: Not on file  Relationships  . Social connections:    Talks on phone: Not on file    Gets together: Not on file    Attends religious service: Not on file    Active member of club or organization: Not on file    Attends meetings of clubs or organizations: Not on file    Relationship status: Not on file  Other Topics Concern  . Not on file  Social History Narrative  . Not on file    Hospital Course: Admitted for ECT treatment.  Patient had right unilateral ECT.  Multiple treatments.  Tolerated well.  Gradually showed slight improvement although remained withdrawn and very quiet.  Ultimately denied suicidal ideation denied psychosis.  Agreed to outpatient treatment in the community  as well as maintenance ECT treatment and was discharged with a plan that she return in 3 days to continue with ECT  Physical Findings: AIMS: Facial and Oral Movements Muscles of Facial Expression: None, normal Lips and Perioral Area: None, normal Jaw: None, normal Tongue: None, normal,Extremity Movements Upper (arms, wrists, hands, fingers): None, normal Lower (legs, knees, ankles, toes): None, normal, Trunk Movements Neck, shoulders, hips: None, normal, Overall Severity Severity of abnormal  movements (highest score from questions above): None, normal Incapacitation due to abnormal movements: None, normal Patient's awareness of abnormal movements (rate only patient's report): No Awareness, Dental Status Current problems with teeth and/or dentures?: No Does patient usually wear dentures?: No  CIWA:    COWS:     Musculoskeletal: Strength & Muscle Tone: within normal limits Gait & Station: normal Patient leans: N/A  Psychiatric Specialty Exam: Physical Exam  Nursing note and vitals reviewed. Constitutional: She appears well-developed and well-nourished.  HENT:  Head: Normocephalic and atraumatic.  Eyes: Pupils are equal, round, and reactive to light. Conjunctivae are normal.  Neck: Normal range of motion.  Cardiovascular: Normal heart sounds.  Respiratory: Effort normal.  GI: Soft.  Musculoskeletal: Normal range of motion.  Neurological: She is alert.  Skin: Skin is warm and dry.  Psychiatric: She has a normal mood and affect. Her behavior is normal. Judgment and thought content normal.    Review of Systems  Constitutional: Negative.   HENT: Negative.   Eyes: Negative.   Respiratory: Negative.   Cardiovascular: Negative.   Gastrointestinal: Negative.   Musculoskeletal: Negative.   Skin: Negative.   Neurological: Negative.   Psychiatric/Behavioral: Negative.     Blood pressure 110/76, pulse 86, temperature (!) 97.5 F (36.4 C), temperature source Oral, resp. rate 18, height '5\' 6"'  (1.676 m), weight 93.9 kg (207 lb), last menstrual period 05/29/2018, SpO2 100 %.Body mass index is 33.41 kg/m.  General Appearance: Fairly Groomed  Eye Contact:  Fair  Speech:  Slow  Volume:  Decreased  Mood:  Euthymic  Affect:  Constricted  Thought Process:  Goal Directed  Orientation:  Full (Time, Place, and Person)  Thought Content:  Logical  Suicidal Thoughts:  No  Homicidal Thoughts:  No  Memory:  Immediate;   Fair Recent;   Fair Remote;   Fair  Judgement:  Fair   Insight:  Fair  Psychomotor Activity:  Normal  Concentration:  Concentration: Fair  Recall:  [place] of Knowledge:  Fair  Language:  Fair  Akathisia:  No  Handed:  Right  AIMS (if indicated):     Assets:  Desire for Improvement  ADL's:  Intact  Cognition:  WNL  Sleep:  Number of Hours: 6.75     Have you used any form of tobacco in the last 30 days? (Cigarettes, Smokeless Tobacco, Cigars, and/or Pipes): No  Has this patient used any form of tobacco in the last 30 days? (Cigarettes, Smokeless Tobacco, Cigars, and/or Pipes) Yes, No  Blood Alcohol level:  Lab Results  Component Value Date   ETH <5 52/77/8242    Metabolic Disorder Labs:  Lab Results  Component Value Date   HGBA1C 6.5 (H) 05/07/2018   MPG 139.85 05/07/2018   No results found for: PROLACTIN Lab Results  Component Value Date   CHOL 181 05/07/2018   TRIG 92 05/07/2018   HDL 46 05/07/2018   CHOLHDL 3.9 05/07/2018   VLDL 18 05/07/2018   LDLCALC 117 (H) 05/07/2018    See Psychiatric Specialty Exam and Suicide Risk  Assessment completed by Attending Physician prior to discharge.  Discharge destination:  Home  Is patient on multiple antipsychotic therapies at discharge:  No   Has Patient had three or more failed trials of antipsychotic monotherapy by history:  No  Recommended Plan for Multiple Antipsychotic Therapies: NA  Discharge Instructions    Diet - low sodium heart healthy   Complete by:  As directed    Increase activity slowly   Complete by:  As directed      Allergies as of 05/30/2018   No Known Allergies     Medication List    STOP taking these medications   exenatide 5 MCG/0.02ML Sopn injection Commonly known as:  BYETTA   hydrOXYzine 25 MG tablet Commonly known as:  ATARAX/VISTARIL     TAKE these medications     Indication  ferrous sulfate 325 (65 FE) MG tablet Take 1 tablet (325 mg total) by mouth daily with breakfast. What changed:  additional instructions  Indication:   Iron Deficiency   FLUoxetine 40 MG capsule Commonly known as:  PROZAC Take 1 capsule (40 mg total) by mouth daily. What changed:  additional instructions  Indication:  Depression   gabapentin 300 MG capsule Commonly known as:  NEURONTIN Take 1 capsule (300 mg total) by mouth 3 (three) times daily. What changed:  additional instructions  Indication:  Diabetes with Nerve Disease   metformin 1000 MG (OSM) 24 hr tablet Commonly known as:  FORTAMET Take 1 tablet (1,000 mg total) by mouth 2 (two) times daily with a meal. What changed:  additional instructions  Indication:  Type 2 Diabetes   protriptyline 10 MG tablet Commonly known as:  VIVACTIL Take 1 tablet (10 mg total) by mouth 3 (three) times daily. Narcolepsy  Indication:  Depression, Recurring Sleep Episodes During the Day   traZODone 50 MG tablet Commonly known as:  DESYREL Take 1 tablet (50 mg total) by mouth at bedtime. Take 2 hours before bedtime: For sleep  Indication:  Trouble Sleeping   traZODone 50 MG tablet Commonly known as:  DESYREL Take 1 tablet (50 mg total) by mouth at bedtime as needed for sleep.  Indication:  [facility] [place]. Go on 06/11/2018.   Why:  Please go to your hospital follow up appointment on Wednesday, 06/11/18 at 8:40AM. Thank you! Contact information: [address] [place], [place] [address]  P: [phone]        [facility]. Go on 06/02/2018.   Why:  Please follow up for ECT treatment on Monday June 02, 2018 at 8:30am. Thank you. Contact information: [place] [id number] [address] [place], [place] Follow up.   Why:  Please call and reschedule your appointment for therapy. Thank you. Contact information: [place] [address] [phone]           Follow-up  recommendations:  Activity:  Activity as tolerated Diet:  Diabetic diet Other:  [facility]  Comments: Patient was discharged with a plan that she [facility] with [facility] but also return for continued ECT treatment after the weekend.  Signed: Alethia Berthold, MD 06/08/2018, 2:59 PM

## 2018-06-08 NOTE — Progress Notes (Signed)
Patient now complains of constipation, stool softener given.

## 2018-06-08 NOTE — [facility] Group Notes (Signed)
LCSW Group Therapy Note 06/08/2018 1:15pm  Type of Therapy and Topic: Group Therapy: Feelings Around Returning Home & Establishing a Supportive Framework and Supporting Oneself When Supports Not Available  Participation Level: Active  Description of Group:  Patients first processed thoughts and feelings about upcoming discharge. These included fears of upcoming changes, lack of change, new living environments, judgements and expectations from others and overall stigma of mental health issues. The group then discussed the definition of a supportive framework, what that looks and feels like, and how do to discern it from an unhealthy non-supportive network. The group identified different types of supports as well as what to do when your family/friends are less than helpful or unavailable  Therapeutic Goals  1. Patient will identify one healthy supportive network that they can use at discharge. 2. Patient will identify one factor of a supportive framework and how to tell it from an unhealthy network. 3. Patient able to identify one coping skill to use when they do not have positive supports from others. 4. Patient will demonstrate ability to communicate their needs through discussion and/or role plays.  Summary of Patient Progress:  Pt stated "I feel okay I guess." Pt engaged during group session. As patients processed their anxiety about discharge and described healthy supports patient shared that she is ready to be discharge because she feels better. Patients identified at least one self-care tool they were willing to use after discharge.   Therapeutic Modalities Cognitive Behavioral Therapy Motivational Interviewing   Johnnye SimaNNIA  CUEBAS-COLON, LCSW 06/08/2018 12:36 PM

## 2018-06-08 NOTE — [facility] Group Notes (Signed)
[facility] Group Notes:  (Nursing/MHT/Case Management/Adjunct)  Date:  06/08/2018  Time:  9:48 PM  Type of Therapy:  Group Therapy  Participation Level:  Active  Participation Quality:  Appropriate  Affect:  Appropriate  Cognitive:  Appropriate  Insight:  Appropriate  Engagement in Group:  Engaged  Modes of Intervention:  Discussion  Summary of Progress/Problems: Azzie AlmasKarial was appropriate during group. Azzie AlmasKarial stated she did not have a goal for the day. When prompted about having a goal for tomorrow, Azzie AlmasKarial stated she hoped to be discharged on tomorrow. MHT reviewed rules and expectations of unit. MHT informed patients of visitation hours and the number of visitors allowed on the unit at one time. MHT informed patients of the phone hours and the limitations of long-distance phone calls. MHT informed patients of the time vitals would be carried out in the morning. MHT encouraged good night sleep and informed calls would be made at [facility] for vitals. MHT informed patients of routine rounds and encouraged patients to cover appropriately. MHT informed patients of the resource board. MHT encouraged patients to link up with outpatient services. MHT explained the benefits of following through with treatment. MHT explained the importance of taking medications. MHT explained one of the most common mistakes about medications. MHT explained that as a diabetic, writer has to take medications daily or else blood sugar will rise uncontrollable. MHT provided an example of how blood sugar was low, then stop taking, and rose over 300. MHT explained when your doing well on medications, that means it's working and should continued to be taken. MHT encouraged patients to follow up with appointments and seek out treatment as needed. Jinger NeighborsKeith D Steaven Wholey 06/08/2018, 9:48 PM

## 2018-06-09 ENCOUNTER — Inpatient Hospital Stay: Payer: BLUE CROSS/BLUE SHIELD | Admitting: Anesthesiology

## 2018-06-09 ENCOUNTER — Telehealth (HOSPITAL_COMMUNITY): Payer: Self-pay | Admitting: *Deleted

## 2018-06-09 ENCOUNTER — Inpatient Hospital Stay: Payer: BLUE CROSS/BLUE SHIELD

## 2018-06-09 LAB — GLUCOSE, CAPILLARY
GLUCOSE-CAPILLARY: 101 mg/dL — AB (ref 70–99)
Glucose-Capillary: 121 mg/dL — ABNORMAL HIGH (ref 70–99)

## 2018-06-09 LAB — POCT PREGNANCY, URINE: PREG TEST UR: NEGATIVE

## 2018-06-09 MED ORDER — SUCCINYLCHOLINE CHLORIDE 20 MG/ML IJ SOLN
INTRAMUSCULAR | Status: AC
  Filled 2018-06-09: qty 1

## 2018-06-09 MED ORDER — DOCUSATE SODIUM 100 MG PO CAPS: 100.0000 mg | ORAL_CAPSULE | Freq: Two times a day (BID) | ORAL | 1 refills | Status: DC

## 2018-06-09 MED ORDER — ONDANSETRON HCL 4 MG/2ML IJ SOLN: 4.0000 mg | Freq: Once | INTRAMUSCULAR | Status: DC | PRN

## 2018-06-09 MED ORDER — SUCCINYLCHOLINE CHLORIDE 200 MG/10ML IV SOSY
PREFILLED_SYRINGE | INTRAVENOUS | Status: DC | PRN
  Administered 2018-06-09: 100 mg via INTRAVENOUS

## 2018-06-09 MED ORDER — ESMOLOL HCL 100 MG/10ML IV SOLN
INTRAVENOUS | Status: DC | PRN
  Administered 2018-06-09: 30 mg via INTRAVENOUS

## 2018-06-09 MED ORDER — LABETALOL HCL 5 MG/ML IV SOLN
INTRAVENOUS | Status: AC
Start: 2018-06-09 — End: ?
  Filled 2018-06-09: qty 4

## 2018-06-09 MED ORDER — GABAPENTIN 300 MG PO CAPS: 300.0000 mg | ORAL_CAPSULE | Freq: Three times a day (TID) | ORAL | 1 refills | Status: DC

## 2018-06-09 MED ORDER — MIDAZOLAM HCL 2 MG/2ML IJ SOLN: 1.0000 mg | Freq: Once | INTRAMUSCULAR | Status: DC

## 2018-06-09 MED ORDER — GLYCOPYRROLATE 0.2 MG/ML IJ SOLN
0.1000 mg | Freq: Once | INTRAMUSCULAR | Status: AC
  Administered 2018-06-09: 0.1 mg via INTRAVENOUS

## 2018-06-09 MED ORDER — FERROUS SULFATE 325 (65 FE) MG PO TABS: 325.0000 mg | ORAL_TABLET | Freq: Every day | ORAL | 0 refills | Status: AC

## 2018-06-09 MED ORDER — METFORMIN HCL ER (OSM) 1000 MG PO TB24: 1000.0000 mg | ORAL_TABLET | Freq: Two times a day (BID) | ORAL | 1 refills | Status: DC

## 2018-06-09 MED ORDER — GLYCOPYRROLATE 0.2 MG/ML IJ SOLN
INTRAMUSCULAR | Status: AC
  Filled 2018-06-09: qty 1

## 2018-06-09 MED ORDER — SODIUM CHLORIDE 0.9 % IV SOLN
500.0000 mL | Freq: Once | INTRAVENOUS | Status: AC
  Administered 2018-06-09: 500 mL via INTRAVENOUS

## 2018-06-09 MED ORDER — FLUOXETINE HCL 40 MG PO CAPS
40.0000 mg | ORAL_CAPSULE | Freq: Every day | ORAL | 1 refills | Status: DC
Start: 2018-06-09 — End: 2018-06-11

## 2018-06-09 MED ORDER — TRAZODONE HCL 50 MG PO TABS: 50.0000 mg | ORAL_TABLET | Freq: Every day | ORAL | 1 refills | Status: DC

## 2018-06-09 MED ORDER — LABETALOL HCL 5 MG/ML IV SOLN
INTRAVENOUS | Status: DC | PRN
  Administered 2018-06-09: 10 mg via INTRAVENOUS

## 2018-06-09 MED ORDER — KETOROLAC TROMETHAMINE 30 MG/ML IJ SOLN
INTRAMUSCULAR | Status: AC
  Filled 2018-06-09: qty 1

## 2018-06-09 MED ORDER — METHOHEXITAL SODIUM 100 MG/10ML IV SOSY
PREFILLED_SYRINGE | INTRAVENOUS | Status: DC | PRN
  Administered 2018-06-09: 90 mg via INTRAVENOUS

## 2018-06-09 MED ORDER — SODIUM CHLORIDE 0.9 % IV SOLN
INTRAVENOUS | Status: DC | PRN
  Administered 2018-06-09: 11:00:00 via INTRAVENOUS

## 2018-06-09 MED ORDER — KETOROLAC TROMETHAMINE 30 MG/ML IJ SOLN
30.0000 mg | Freq: Once | INTRAMUSCULAR | Status: AC
  Administered 2018-06-09: 30 mg via INTRAVENOUS

## 2018-06-09 MED ORDER — FENTANYL CITRATE (PF) 100 MCG/2ML IJ SOLN: 25.0000 ug | INTRAMUSCULAR | Status: DC | PRN

## 2018-06-09 MED ORDER — ESMOLOL HCL 100 MG/10ML IV SOLN
INTRAVENOUS | Status: AC
  Filled 2018-06-09: qty 10

## 2018-06-09 NOTE — Procedures (Signed)
ECT SERVICES Physician's Interval Evaluation & Treatment Note  Patient Identification: Renee Turner MRN:  [id number] Date of Evaluation:  06/09/2018 TX #: [id number]  MADRS:   MMSE:   P.E. Findings:  No change   Psychiatric Interval Note:  improved2  Subjective:  Patient is a 22 y.o. female seen for evaluation for Electroconvulsive Therapy. Feeling less depressed  Treatment Summary:   '[x]'   Right Unilateral             '[]'  Bilateral   % Energy : 0.70m 50%   Impedance: 1840 ohms  Seizure Energy Index: 21616 microvolts squ  Postictal Suppression Index: 94%  Seizure Concordance Index: 97%  Medications  Pre Shock: rrob 02m es 3017mab 42m69mr 30mg79mv 90mg,42m 100mg  44m Shock:    Seizure Duration: emg 26sec, eeg 49sec   Comments: Fu 8/16   Lungs:  '[x]'   Clear to auscultation               '[]'  Other:   Heart:    '[x]'   Regular rhythm             '[]'  irregular rhythm    '[x]'   Previous H&P reviewed, patient examined and there are NO CHANGES                 '[]'   Previous H&P reviewed, patient examined and there are changes noted.   Khyran Riera ClAlethia Berthold5/201911:11 AM

## 2018-06-09 NOTE — Anesthesia Postprocedure Evaluation (Signed)
Anesthesia Post Note  Patient: Renee Turner  Procedure(s) Performed: ECT TX  Patient location during evaluation: PACU Anesthesia Type: General Level of consciousness: awake and alert and oriented Pain management: pain level controlled Vital Signs Assessment: post-procedure vital signs reviewed and stable Respiratory status: spontaneous breathing Cardiovascular status: blood pressure returned to baseline Anesthetic complications: no     Last Vitals:  Vitals:   06/09/18 1130 06/09/18 1140  BP: 123/81 108/89  Pulse: 90 89  Resp: 19 16  Temp:  36.8 C  SpO2: 98% 98%    Last Pain:  Vitals:   06/09/18 1140  TempSrc:   PainSc: 0-No pain                 Saw [place]

## 2018-06-09 NOTE — Anesthesia Preprocedure Evaluation (Signed)
Anesthesia Evaluation  Patient identified by MRN, date of birth, ID band Patient awake    Reviewed: Allergy & Precautions, H&P , NPO status , Patient's Chart, lab work & pertinent test results, reviewed documented beta blocker date and time   Airway Mallampati: II   Neck ROM: full    Dental  (+) Poor Dentition   Pulmonary neg pulmonary ROS,    Pulmonary exam normal        Cardiovascular negative cardio ROS Normal cardiovascular exam Rhythm:regular Rate:Normal     Neuro/Psych negative neurological ROS  negative psych ROS   GI/Hepatic negative GI ROS, Neg liver ROS,   Endo/Other  negative endocrine ROSdiabetes  Renal/GU negative Renal ROS  negative genitourinary   Musculoskeletal   Abdominal   Peds  Hematology negative hematology ROS (+)   Anesthesia Other Findings Past Medical History: No date: Anxiety No date: Depression No date: Diabetes mellitus without complication ([name])     Comment:  Type II No date: Diabetes mellitus, type II ([name]) History reviewed. No pertinent surgical history. BMI    Body Mass Index:  32.12 kg/m     Reproductive/Obstetrics negative OB ROS                             Anesthesia Physical  Anesthesia Plan  ASA: II  Anesthesia Plan: General   Post-op Pain Management:    Induction:   PONV Risk Score and Plan:   Airway Management Planned: Mask  Additional Equipment:   Intra-op Plan:   Post-operative Plan:   Informed Consent: I have reviewed the patients History and Physical, chart, labs and discussed the procedure including the risks, benefits and alternatives for the proposed anesthesia with the patient or authorized representative who has indicated his/her understanding and acceptance.   Dental Advisory Given  Plan Discussed with: CRNA  Anesthesia Plan Comments:         Anesthesia Quick Evaluation

## 2018-06-09 NOTE — Tx Team (Signed)
Interdisciplinary Treatment and Diagnostic Plan Update  06/09/2018 Time of Session: 10:30am Renee Turner MRN: [id number]  Principal Diagnosis: Severe recurrent major depression without psychotic features [facility])  Secondary Diagnoses: Principal Problem:   Severe recurrent major depression without psychotic features ([place]) Active Problems:   Posttraumatic stress disorder   Diabetic neuropathy associated with type 2 diabetes mellitus ([place])   Type 2 diabetes mellitus with hyperglycemia, without long-term current use of insulin ([name])   Current Medications:  Current Facility-Administered Medications  Medication Dose Route Frequency Provider Last Rate Last Dose  . acetaminophen (TYLENOL) tablet 650 mg  650 mg Oral Q6H PRN Clapacs, John T, MD      . alum & mag hydroxide-simeth (MAALOX/MYLANTA) 200-200-20 MG/5ML suspension 30 mL  30 mL Oral Q4H PRN Clapacs, John T, MD      . docusate sodium (COLACE) capsule 100 mg  100 mg Oral BID Clapacs, Madie [place], MD   100 mg at 06/08/18 1635  . fentaNYL (SUBLIMAZE) injection 25 mcg  25 mcg Intravenous Q5 min PRN Molli Barrows, MD      . fentaNYL (SUBLIMAZE) injection 25 mcg  25 mcg Intravenous Q5 min PRN Alvin Critchley, MD      . ferrous sulfate tablet 325 mg  325 mg Oral Q breakfast Clapacs, Madie [place], MD   325 mg at 06/08/18 0830  . FLUoxetine (PROZAC) capsule 40 mg  40 mg Oral Daily Clapacs, Madie [place], MD   40 mg at 06/08/18 0829  . gabapentin (NEURONTIN) capsule 300 mg  300 mg Oral TID Clapacs, Madie [place], MD   300 mg at 06/08/18 1635  . glycopyrrolate (ROBINUL) 0.2 MG/ML injection           . ketorolac (TORADOL) 30 MG/ML injection           . magnesium hydroxide (MILK OF MAGNESIA) suspension 30 mL  30 mL Oral Daily PRN Clapacs, Madie [place], MD   30 mL at 06/05/18 2150  . metFORMIN (GLUCOPHAGE-XR) 24 hr tablet 1,000 mg  1,000 mg Oral BID WC Clapacs, John T, MD   1,000 mg at 06/08/18 1635  . midazolam (VERSED) injection 1 mg  1 mg Intravenous Once Clapacs, John T, MD       . midazolam (VERSED) injection 2 mg  2 mg Intravenous Once Clapacs, John T, MD      . ondansetron (ZOFRAN) injection 4 mg  4 mg Intravenous Once PRN Molli Barrows, MD      . ondansetron [facility]) injection 4 mg  4 mg Intravenous Once PRN Alvin Critchley, MD      . protriptyline (VIVACTIL) tablet 10 mg  10 mg Oral TID Clapacs, Madie [place], MD      . traZODone (DESYREL) tablet 50 mg  50 mg Oral QHS Clapacs, Madie [place], MD   50 mg at 06/08/18 1945  . traZODone (DESYREL) tablet 50 mg  50 mg Oral QHS PRN Clapacs, Madie [place], MD       Facility-Administered Medications Ordered in Other Encounters  Medication Dose Route Frequency Provider Last Rate Last Dose  . 0.9 %  sodium chloride infusion    Continuous PRN Dionne Bucy, CRNA      . esmolol (BREVIBLOC) injection   Intravenous Anesthesia Intra-op Dionne Bucy, CRNA   30 mg at 06/09/18 1102  . labetalol (NORMODYNE,TRANDATE) injection   Intravenous Anesthesia Intra-op Dionne Bucy, CRNA   10 mg at 06/09/18 1102  . methohexital Sodium   Intravenous Anesthesia Intra-op Dionne Bucy, CRNA  90 mg at 06/09/18 1102  . succinylcholine (ANECTINE) syringe   Intravenous Anesthesia Intra-op Dionne Bucy, CRNA   100 mg at 06/09/18 1104   PTA Medications: Medications Prior to Admission  Medication Sig Dispense Refill Last Dose  . ferrous sulfate 325 (65 FE) MG tablet Take 1 tablet (325 mg total) by mouth daily with breakfast. (Patient not taking: Reported on 06/02/2018) 30 tablet 0 Not Taking at Unknown time  . FLUoxetine (PROZAC) 40 MG capsule Take 1 capsule (40 mg total) by mouth daily. (Patient not taking: Reported on 06/02/2018) 30 capsule 0 Not Taking at Unknown time  . gabapentin (NEURONTIN) 300 MG capsule Take 1 capsule (300 mg total) by mouth 3 (three) times daily. (Patient not taking: Reported on 06/02/2018) 90 capsule 0 Not Taking at Unknown time  . metFORMIN (FORTAMET) 1000 MG (OSM) 24 hr tablet Take 1 tablet (1,000 mg total) by mouth 2 (two) times daily  with a meal. (Patient not taking: Reported on 06/02/2018) 60 tablet 0 Not Taking at Unknown time  . protriptyline (VIVACTIL) 10 MG tablet Take 1 tablet (10 mg total) by mouth 3 (three) times daily. Narcolepsy (Patient not taking: Reported on 06/02/2018) 90 tablet 0 Not Taking at Unknown time  . traZODone (DESYREL) 50 MG tablet Take 1 tablet (50 mg total) by mouth at bedtime. Take 2 hours before bedtime: For sleep (Patient not taking: Reported on 06/02/2018) 30 tablet 0 Not Taking at Unknown time  . traZODone (DESYREL) 50 MG tablet Take 1 tablet (50 mg total) by mouth at bedtime as needed for sleep. (Patient not taking: Reported on 06/02/2018) 30 tablet 0 Not Taking at Unknown time    Patient Stressors: Medication change or noncompliance Traumatic event  Patient Strengths: Average or above average intelligence Communication skills Supportive family/friends Work skills  Treatment Modalities: Medication Management, Group therapy, Case management,  1 to 1 session with clinician, Psychoeducation, Recreational therapy.   Physician Treatment Plan for Primary Diagnosis: Severe recurrent major depression without psychotic features ([place]) Long Term Goal(s): Improvement in symptoms so as ready for discharge Improvement in symptoms so as ready for discharge   Short Term Goals: Ability to verbalize feelings will improve Ability to disclose and discuss suicidal ideas Ability to identify and develop effective coping behaviors will improve Ability to maintain clinical measurements within normal limits will improve  Medication Management: Evaluate patient's response, side effects, and tolerance of medication regimen.  Therapeutic Interventions: 1 to 1 sessions, Unit Group sessions and Medication administration.  Evaluation of Outcomes: Progressing  Physician Treatment Plan for Secondary Diagnosis: Principal Problem:   Severe recurrent major depression without psychotic features ([place]) Active Problems:    Posttraumatic stress disorder   Diabetic neuropathy associated with type 2 diabetes mellitus ([name])   Type 2 diabetes mellitus with hyperglycemia, without long-term current use of insulin ([name])  Long Term Goal(s): Improvement in symptoms so as ready for discharge Improvement in symptoms so as ready for discharge   Short Term Goals: Ability to verbalize feelings will improve Ability to disclose and discuss suicidal ideas Ability to identify and develop effective coping behaviors will improve Ability to maintain clinical measurements within normal limits will improve     Medication Management: Evaluate patient's response, side effects, and tolerance of medication regimen.  Therapeutic Interventions: 1 to 1 sessions, Unit Group sessions and Medication administration.  Evaluation of Outcomes: Progressing   RN Treatment Plan for Primary Diagnosis: Severe recurrent major depression without psychotic features ([place]) Long Term Goal(s): Knowledge of disease and  therapeutic regimen to maintain health will improve  Short Term Goals: Ability to verbalize frustration and anger appropriately will improve, Ability to verbalize feelings will improve, Ability to identify and develop effective coping behaviors will improve and Compliance with prescribed medications will improve  Medication Management: RN will administer medications as ordered by provider, will assess and evaluate patient's response and provide education to patient for prescribed medication. RN will report any adverse and/or side effects to prescribing provider.  Therapeutic Interventions: 1 on 1 counseling sessions, Psychoeducation, Medication administration, Evaluate responses to treatment, Monitor vital signs and CBGs as ordered, Perform/monitor CIWA, COWS, AIMS and Fall Risk screenings as ordered, Perform wound care treatments as ordered.  Evaluation of Outcomes: Progressing   LCSW Treatment Plan for Primary Diagnosis: Severe recurrent  major depression without psychotic features ([place]) Long Term Goal(s): Safe transition to appropriate next level of care at discharge, Engage patient in therapeutic group addressing interpersonal concerns.  Short Term Goals: Engage patient in aftercare planning with referrals and resources, Increase social support, Identify triggers associated with mental health/substance abuse issues and Increase skills for wellness and recovery  Therapeutic Interventions: Assess for all discharge needs, 1 to 1 time with Social worker, Explore available resources and support systems, Assess for adequacy in community support network, Educate family and significant other(s) on suicide prevention, Complete Psychosocial Assessment, Interpersonal group therapy.  Evaluation of Outcomes: Progressing   Progress in Treatment: Attending groups: Yes. Participating in groups: Yes. Taking medication as prescribed: Yes. Toleration medication: Yes. Family/Significant other contact made: No, will contact:  Patient refused Patient understands diagnosis: Yes. Discussing patient identified problems/goals with staff: Yes. Medical problems stabilized or resolved: Yes. Denies suicidal/homicidal ideation: Yes. Issues/concerns per patient self-inventory: No. Other:   New problem(s) identified: No, Describe:  None  New Short Term/Long Term Goal(s): No goal identified  Patient Goals:  No goal identified  Discharge Plan or Barriers:  To return home and follow up with outpatient treatment with your providers.  Reason for Continuation of Hospitalization: Anxiety Depression Medication stabilization  Estimated Length of Stay: 1 day  Attendees: Patient: Renee Turner 06/09/2018 11:13 AM  Physician: Dr. Bary Leriche, MD 06/09/2018 11:13 AM  Nursing:  06/09/2018 11:13 AM  RN Care Manager: 06/09/2018 11:13 AM  Social Worker: Darin Engels, [place] 06/09/2018 11:13 AM  Recreational Therapist:  06/09/2018 11:13 AM  Other:  06/09/2018 11:13 AM   Other:  06/09/2018 11:13 AM  Other: 06/09/2018 11:13 AM    Scribe for Treatment Team: Darin Engels, LCSW 06/09/2018 11:13 AM

## 2018-06-09 NOTE — Progress Notes (Signed)
Recreation Therapy Notes   Date: 06/09/2018  Time: 9:30 pm   Location: Craft Room   Behavioral response: N/A   Intervention Topic: Coping Skills  Discussion/Intervention: Patient did not attend group.   Clinical Observations/Feedback:  Patient did not attend group.   Oracio Galen LRT/CTRS         Kyen Taite 06/09/2018 10:12 AM

## 2018-06-09 NOTE — Discharge Summary (Signed)
Physician Discharge Summary Note  Patient:  Renee Turner is an 22 y.o., female MRN:  [id number] DOB:  11-12-1995 Patient phone:  [phone] (home)  Patient address:   [place] [address],  Total Time spent with patient: 45 minutes  Date of Admission:  06/02/2018 Date of Discharge: June 09, 2018  Reason for Admission: Patient was admitted voluntarily after an ECT treatment.  This was a readmission after being home for just 2 days.  Patient returned to ECT not having taken any of her psychiatric medicine with suicidal ideation and severe depression and little seeming insight or motivation for treatment.  Principal Problem: Severe recurrent major depression without psychotic features [facility]) Discharge Diagnoses: Patient Active Problem List   Diagnosis Date Noted  . Severe recurrent major depression without psychotic features ([place]) [F33.2] 05/14/2018  . MDD (major depressive disorder), recurrent severe, without psychosis ([place]) [F33.2] 05/06/2018  . 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 Posttraumatic stress disorder [F43.10] 11/13/2016  . Severe episode of recurrent major depressive disorder, without psychotic features ([place]) [F33.2] 11/07/2016  . Diabetes ([place]) [E11.9] 11/07/2016  . Diabetic neuropathy associated with type 2 diabetes mellitus ([place]) [E11.40] 07/19/2016  . Obesity (BMI 30.0-34.9) [E66.9] 07/19/2016  . Type 2 diabetes mellitus with hyperglycemia, without long-term current use of insulin ([place]) [E11.65] 07/19/2016    Past Psychiatric History: Patient has a long history of depression going back several years likely PTSD as well.  Had been receiving ECT treatment at our facility and had been discharged home as somewhat improved but now comes back to the hospital again with worsening symptoms for  Past Medical History:  Past Medical History:  Diagnosis Date  . Anxiety   . Depression   . Diabetes mellitus without complication ([place])     Type II  . Diabetes mellitus, type II ([place])    History reviewed. No pertinent surgical history. Family History: History reviewed. No pertinent family history. Family Psychiatric  History: None known Social History:  Social History   Substance and Sexual Activity  Alcohol Use Yes     Social History   Substance and Sexual Activity  Drug Use No    Social History   Socioeconomic History  . Marital status: Single    Spouse name: Not on file  . Number of children: Not on file  . Years of education: Not on file  . Highest education level: Not on file  Occupational History  . Not on file  Social Needs  . Financial resource strain: Not on file  . Food insecurity:    Worry: Not on file    Inability: Not on file  . Transportation needs:    Medical: Not on file    Non-medical: Not on file  Tobacco Use  . Smoking status: Never Smoker  . Smokeless tobacco: Never Used  Substance and Sexual Activity  . Alcohol use: Yes  . Drug use: No  . Sexual activity: Yes    Birth control/protection: None  Lifestyle  . Physical activity:    Days per week: Not on file    Minutes per session: Not on file  . Stress: Not on file  Relationships  . Social connections:    Talks on phone: Not on file    Gets together: Not on file    Attends religious service: Not on file    Active member of club or organization: Not on file    Attends meetings of clubs or organizations: Not on file  Relationship status: Not on file  Other Topics Concern  . Not on file  Social History Narrative  . Not on file    Hospital Course: Patient was voluntarily admitted back to the hospital.  ECT treatment was continued.  Patient received 3 more treatments in the hospital.  Mood improved.  By the time of discharge mood is brighter she is smiling and more upbeat.  Denies suicidal ideation.  During this hospital stay the patient worked with Dr. Mamie Nick out of preference because she feels more comfortable talking with women  and had worked with Dr. Mamie Nick previously.  However by the time of discharge she was able to articulate to me that she did not have any suicidal thoughts and that she agreed to outpatient treatment.  Patient will return for maintenance ECT by scheduled appointment on the 16th.  Physical Findings: AIMS: Facial and Oral Movements Muscles of Facial Expression: None, normal, ,  ,  ,    CIWA:    COWS:     Musculoskeletal: Strength & Muscle Tone: within normal limits Gait & Station: normal Patient leans: N/A  Psychiatric Specialty Exam: Physical Exam  Nursing note and vitals reviewed. Constitutional: She appears well-developed and well-nourished.  HENT:  Head: Normocephalic and atraumatic.  Eyes: Pupils are equal, round, and reactive to light. Conjunctivae are normal.  Neck: Normal range of motion.  Cardiovascular: Normal heart sounds.  Respiratory: Effort normal.  GI: Soft.  Musculoskeletal: Normal range of motion.  Neurological: She is alert.  Skin: Skin is warm and dry.  Psychiatric: Judgment normal. Her affect is blunt. Her speech is delayed. She is slowed. Thought content is not paranoid. Cognition and memory are normal. She expresses no homicidal and no suicidal ideation.    Review of Systems  Constitutional: Negative.   HENT: Negative.   Eyes: Negative.   Respiratory: Negative.   Cardiovascular: Negative.   Gastrointestinal: Negative.   Musculoskeletal: Negative.   Skin: Negative.   Neurological: Negative.   Psychiatric/Behavioral: Negative for depression, hallucinations, memory loss, substance abuse and suicidal ideas. The patient is nervous/anxious. The patient does not have insomnia.     Blood pressure 108/89, pulse 89, temperature 98.3 F (36.8 C), resp. rate 16, height '5\' 6"'  (1.676 m), weight 90.3 kg (199 lb), last menstrual period 05/29/2018, SpO2 98 %.Body mass index is 32.12 kg/m.  General Appearance: Fairly Groomed  Eye Contact:  Fair  Speech:  Clear and Coherent   Volume:  Decreased  Mood:  Euthymic  Affect:  Constricted  Thought Process:  Goal Directed  Orientation:  Full (Time, Place, and Person)  Thought Content:  Logical  Suicidal Thoughts:  No  Homicidal Thoughts:  No  Memory:  Immediate;   Fair Recent;   Fair Remote;   Fair  Judgement:  Fair  Insight:  Fair  Psychomotor Activity:  Normal  Concentration:  Concentration: Fair  Recall:  [employer] of Knowledge:  Fair  Language:  Fair  Akathisia:  No  Handed:  Right  AIMS (if indicated):     Assets:  Desire for Improvement Physical Health Resilience Social Support  ADL's:  Intact  Cognition:  WNL  Sleep:  Number of Hours: 7.75     Have you used any form of tobacco in the last 30 days? (Cigarettes, Smokeless Tobacco, Cigars, and/or Pipes): No  Has this patient used any form of tobacco in the last 30 days? (Cigarettes, Smokeless Tobacco, Cigars, and/or Pipes) Yes, No  Blood Alcohol level:  Lab Results  Component Value Date   ETH <5 62/13/0865    Metabolic Disorder Labs:  Lab Results  Component Value Date   HGBA1C 6.5 (H) 05/07/2018   MPG 139.85 05/07/2018   No results found for: PROLACTIN Lab Results  Component Value Date   CHOL 181 05/07/2018   TRIG 92 05/07/2018   HDL 46 05/07/2018   CHOLHDL 3.9 05/07/2018   VLDL 18 05/07/2018   LDLCALC 117 (H) 05/07/2018    See Psychiatric Specialty Exam and Suicide Risk Assessment completed by Attending Physician prior to discharge.  Discharge destination:  Home  Is patient on multiple antipsychotic therapies at discharge:  No   Has Patient had three or more failed trials of antipsychotic monotherapy by history:  No  Recommended Plan for Multiple Antipsychotic Therapies: NA   Allergies as of 06/09/2018   No Known Allergies     Medication List    STOP taking these medications   protriptyline 10 MG tablet Commonly known as:  VIVACTIL     TAKE these medications     Indication  docusate sodium 100 MG  capsule Commonly known as:  COLACE Take 1 capsule (100 mg total) by mouth 2 (two) times daily.  Indication:  Constipation   ferrous sulfate 325 (65 FE) MG tablet Take 1 tablet (325 mg total) by mouth daily with breakfast.  Indication:  Iron Deficiency   FLUoxetine 40 MG capsule Commonly known as:  PROZAC Take 1 capsule (40 mg total) by mouth daily.  Indication:  Depression   gabapentin 300 MG capsule Commonly known as:  NEURONTIN Take 1 capsule (300 mg total) by mouth 3 (three) times daily.  Indication:  Diabetes with Nerve Disease   metformin 1000 MG (OSM) 24 hr tablet Commonly known as:  FORTAMET Take 1 tablet (1,000 mg total) by mouth 2 (two) times daily with a meal.  Indication:  Type 2 Diabetes   traZODone 50 MG tablet Commonly known as:  DESYREL Take 1 tablet (50 mg total) by mouth at bedtime. What changed:    additional instructions  Another medication with the same name was removed. Continue taking this medication, and follow the directions you see here.  Indication:  [facility]. Go on 06/11/2018.   Why:  Please follow up with your provider on Wednesday June 24, 2018 at 8:40am. Thank you.  Contact information: [facility], [address] [place], [place] [address]  P: [phone]        [facility], [facility]. Go on 06/11/2018.   Why:  Your follow up appointment with your therapist is scheduled for Wednesday June 11, 2018 at 2pm. Thank you. Contact information: [place] [address] [phone]        [facility]. Go on 06/20/2018.   Why:  Please follow up for ECT treatment on Friday June 20, 2018 at 8:30am. Thank you Contact information: [place] [id number] [address] [place] [phone]          Follow-up recommendations:  Activity:  Activity as tolerated Diet:   Continue diabetic diet Other:  Follow-up with outpatient [facility] at [facility] and also follow-up with ECT treatment with me on August 16  Comments: Reviewed the patient's progress and treatment plan with the unit treatment team as well as ECT treatment team.  It is agreed that patient clearly is improved..  Patient will be  discharged although she continues to have high risk of return of depressive symptoms she has so far been compliant with treatment. Signed: Alethia Berthold, MD 06/09/2018, 3:34 PM

## 2018-06-09 NOTE — Plan of Care (Signed)
Patient is alert and oriented; denies SI, and AVH. Patient complains of having constipation and depression. Patient is very pleasant. Interaction is minimal. Patient compliant with medications. Nurse will continue to monitor.
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
## 2018-06-09 NOTE — H&P (Signed)
Renee Turner is an 22 y.o. female.   Chief Complaint: Feeling better HPI: severe depression   Past Medical History:  Diagnosis Date  . Anxiety   . Depression   . Diabetes mellitus without complication ([place])    Type II  . Diabetes mellitus, type II ([place])     History reviewed. No pertinent surgical history.  History reviewed. No pertinent family history. Social History:  reports that she has never smoked. She has never used smokeless tobacco. She reports that she drinks alcohol. She reports that she does not use drugs.  Allergies: No Known Allergies  Medications Prior to Admission  Medication Sig Dispense Refill  . ferrous sulfate 325 (65 FE) MG tablet Take 1 tablet (325 mg total) by mouth daily with breakfast. (Patient not taking: Reported on 06/02/2018) 30 tablet 0  . FLUoxetine (PROZAC) 40 MG capsule Take 1 capsule (40 mg total) by mouth daily. (Patient not taking: Reported on 06/02/2018) 30 capsule 0  . gabapentin (NEURONTIN) 300 MG capsule Take 1 capsule (300 mg total) by mouth 3 (three) times daily. (Patient not taking: Reported on 06/02/2018) 90 capsule 0  . metFORMIN (FORTAMET) 1000 MG (OSM) 24 hr tablet Take 1 tablet (1,000 mg total) by mouth 2 (two) times daily with a meal. (Patient not taking: Reported on 06/02/2018) 60 tablet 0  . protriptyline (VIVACTIL) 10 MG tablet Take 1 tablet (10 mg total) by mouth 3 (three) times daily. Narcolepsy (Patient not taking: Reported on 06/02/2018) 90 tablet 0  . traZODone (DESYREL) 50 MG tablet Take 1 tablet (50 mg total) by mouth at bedtime. Take 2 hours before bedtime: For sleep (Patient not taking: Reported on 06/02/2018) 30 tablet 0  . traZODone (DESYREL) 50 MG tablet Take 1 tablet (50 mg total) by mouth at bedtime as needed for sleep. (Patient not taking: Reported on 06/02/2018) 30 tablet 0    Results for orders placed or performed during the hospital encounter of 06/02/18 (from the past 48 hour(s))  Glucose, capillary     Status:  Abnormal   Collection Time: 06/09/18  6:03 AM  Result Value Ref Range   Glucose-Capillary 101 (H) 70 - 99 mg/dL  Pregnancy, urine POC     Status: None   Collection Time: 06/09/18 10:02 AM  Result Value Ref Range   Preg Test, Ur NEGATIVE NEGATIVE    Comment:        THE SENSITIVITY OF THIS METHODOLOGY IS >24 mIU/mL    No results found.  Review of Systems  Constitutional: Negative.   HENT: Negative.   Eyes: Negative.   Respiratory: Negative.   Cardiovascular: Negative.   Gastrointestinal: Negative.   Musculoskeletal: Negative.   Skin: Negative.   Neurological: Negative.   Psychiatric/Behavioral: Negative.     Blood pressure 102/82, pulse 84, temperature 98.2 F (36.8 C), temperature source Oral, resp. rate 16, height _0  (1.676 m), weight 90.3 kg (199 lb), last menstrual period 05/29/2018, SpO2 98 %. Physical Exam  Nursing note and vitals reviewed. Constitutional: She appears well-developed and well-nourished.  HENT:  Head: Normocephalic and atraumatic.  Eyes: Pupils are equal, round, and reactive to light. Conjunctivae are normal.  Neck: Normal range of motion.  Cardiovascular: Regular rhythm and normal heart sounds.  Respiratory: Effort normal.  GI: Soft.  Musculoskeletal: Normal range of motion.  Neurological: She is alert.  Skin: Skin is warm and dry.  Psychiatric: Judgment normal. Her affect is blunt. Her speech is delayed. She is slowed. Thought content is not paranoid.  Cognition and memory are normal. She expresses no homicidal and no suicidal ideation.     Assessment/Plan Follow up 8/16 after discharge  Alethia Berthold, MD 06/09/2018, 10:59 AM

## 2018-06-09 NOTE — Discharge Planning (Signed)
Patient is alert and oriented X 4 denies SI, HI and AVH. Patient received discharge instructions as well as follow up appointments. Patient received all belongings in locker with verification everything was present. Patient rated pain 0/10. Patient escorted to waiting area where family/ friend awaited to take patient home.

## 2018-06-09 NOTE — Progress Notes (Signed)
  [facility] :  Will you be returning to the same living situation after discharge:  Yes,  Home with a friend At discharge, do you have transportation home?: Yes,  A friend will come and pick up the patient at discharge Do you have the ability to pay for your medications: Yes,  Insurance  Release of information consent forms completed and in the chart;  Patient's signature needed at discharge.  Patient to Follow up at: Follow-up Information    [facility] [place]. Go on 06/11/2018.   Why:  Please follow up with your provider on Wednesday June 24, 2018 at 8:40am. Thank you.  Contact information: [address] [place], [place] [address]  P: [phone]        [facility]. Go on 06/11/2018.   Why:  Your follow up appointment with your therapist is scheduled for Wednesday June 11, 2018 at 2pm. Thank you. Contact information: [address] [place] [phone]        [facility]. Go on 06/20/2018.   Why:  Please follow up for ECT treatment on Friday June 20, 2018 at 8:30am. Thank you Contact information: [id number] [place] [id number] [phone]          Next level of care provider has [facility] to [facility] Link:no  Safety Planning and Suicide Prevention discussed: Yes,  Completed with patient   Have you used any form of tobacco in the last 30 days? (Cigarettes, Smokeless Tobacco, Cigars, and/or Pipes): No  Has patient been referred to the Quitline?: N/A patient is not a smoker  Patient has been referred for addiction treatment: N/A  Renee ShearsCassandra  Renee Posada, LCSW 06/09/2018, 3:04 PM

## 2018-06-09 NOTE — Anesthesia Procedure Notes (Signed)
Date/Time: 06/09/2018 11:06 AM Performed by: Lily KocherPeralta, Vika Buske, CRNA Pre-anesthesia Checklist: Patient identified, Emergency Drugs available, Suction available and Patient being monitored Patient Re-evaluated:Patient Re-evaluated prior to induction Oxygen Delivery Method: Circle system utilized Preoxygenation: Pre-oxygenation with 100% oxygen Induction Type: IV induction Ventilation: Mask ventilation without difficulty and Mask ventilation throughout procedure Airway Equipment and Method: Bite block Placement Confirmation: positive ETCO2 Dental Injury: Teeth and Oropharynx as per pre-operative assessment

## 2018-06-09 NOTE — Telephone Encounter (Signed)
Called [employer] on 7/31 for prior authorization of ECT. Spoke with Eustace Penravia H who gave approval of 6 sessions beginning 7/29-9/30/19. Auth #[id number].

## 2018-06-09 NOTE — Transfer of Care (Signed)
Immediate Anesthesia Transfer of Care Note  Patient: Renee Turner  Procedure(s) Performed: ECT TX  Patient Location: PACU  Anesthesia Type:General  Level of Consciousness: sedated  Airway & Oxygen Therapy: Patient Spontanous Breathing and Patient connected to face mask oxygen  Post-op Assessment: Report given to RN and Post -op Vital signs reviewed and stable  Post vital signs: Reviewed and stable  Last Vitals:  Vitals Value Taken Time  BP 87/74 06/09/2018 11:20 AM  Temp 37.6 C 06/09/2018 11:20 AM  Pulse 91 06/09/2018 11:22 AM  Resp 18 06/09/2018 11:22 AM  SpO2 99 % 06/09/2018 11:22 AM  Vitals shown include unvalidated device data.  Last Pain:  Vitals:   06/09/18 1120  TempSrc:   PainSc: 0-No pain         Complications: No apparent anesthesia complications

## 2018-06-09 NOTE — Anesthesia Post-op Follow-up Note (Signed)
Anesthesia QCDR form completed.        

## 2018-06-09 NOTE — Progress Notes (Signed)
Recreation Therapy Notes  INPATIENT RECREATION TR PLAN  Patient Details Name: Renee Turner MRN: [id number] DOB: 1996/07/05 Today's Date: 06/09/2018  Rec Therapy Plan Is patient appropriate for Therapeutic Recreation?: Yes Treatment times per week: at least 3 Estimated Length of Stay: 5-7 days TR Treatment/Interventions: Group participation (Comment)  Discharge Criteria Pt will be discharged from therapy if:: Discharged Treatment plan/goals/alternatives discussed and agreed upon by:: Patient/family  Discharge Summary Short term goals set: Patient will engage in groups without prompting or encouragement from LRT x3 group sessions within 5 recreation therapy group sessions Short term goals met: Not met Reason goals not met: Patient did not attend any goals Therapeutic equipment acquired: N/A Reason patient discharged from therapy: Discharge from hospital Pt/family agrees with progress & goals achieved: Yes Date patient discharged from therapy: 06/09/18   Devontae Casasola 06/09/2018, 3:50 PM

## 2018-06-11 ENCOUNTER — Encounter (HOSPITAL_COMMUNITY): Payer: Self-pay | Admitting: Psychiatry

## 2018-06-11 ENCOUNTER — Ambulatory Visit (INDEPENDENT_AMBULATORY_CARE_PROVIDER_SITE_OTHER): Payer: BLUE CROSS/BLUE SHIELD | Admitting: Psychiatry

## 2018-06-11 VITALS — BP 110/70 | HR 68 | Ht 66.0 in | Wt 203.0 lb

## 2018-06-11 DIAGNOSIS — F332 Major depressive disorder, recurrent severe without psychotic features: Secondary | ICD-10-CM | POA: Diagnosis not present

## 2018-06-11 MED ORDER — FLUOXETINE HCL 40 MG PO CAPS: 40.0000 mg | ORAL_CAPSULE | ORAL | 0 refills | Status: DC

## 2018-06-11 MED ORDER — ARIPIPRAZOLE 5 MG PO TABS: 5.0000 mg | ORAL_TABLET | ORAL | 0 refills | Status: DC

## 2018-06-11 MED ORDER — TRAZODONE HCL 50 MG PO TABS: 50.0000 mg | ORAL_TABLET | Freq: Every day | ORAL | 0 refills | Status: DC

## 2018-06-11 NOTE — Progress Notes (Signed)
BH MD/PA/NP OP Progress Note  06/11/2018 9:31 AM Renee Turner  MRN:  [id number]  Chief Complaint: same, depressed  HPI: Renee Turner presents with ongoing depression.  Living at home, and not working for the past month out on FMLA.  She wants to get back to work, but feels depressed, dysphoric, fatigued.  I spoke with her therapist yesterday who has ongoing concerns about the patient's depression and very poor medication adherence.  She denies any suicidality, but continues to present as hopeless and flat. Patient reports she last took her Prozac about 2 days ago.  She does not think ECT gave her any particular benefit.  Reviewed with the patient that we have tried TMS as well, and spent time discussing a switch to long-acting antipsychotic injection if she is tolerating Abilify oral tablets.  She was receptive to this.  She recently adopted a dog and was marginally more bright, sharing that she is happy to have a dog that will love her.  She continues to feel unloved and uncared for by her family, but is unable to name any concrete reasons as to why she feels this way.  Visit Diagnosis:    ICD-10-CM   1. Severe recurrent major depression without psychotic features ([name]) F33.2 ARIPiprazole (ABILIFY) 5 MG tablet    FLUoxetine (PROZAC) 40 MG capsule    traZODone (DESYREL) 50 MG tablet    Past Psychiatric History: See intake H&P for full details. Reviewed, with no updates at this time.   Past Medical History:  Past Medical History:  Diagnosis Date  . Anxiety   . Depression   . Diabetes mellitus without complication ([name])    Type II  . Diabetes mellitus, type II ([name])    History reviewed. No pertinent surgical history.  Family Psychiatric History: See intake H&P for full details. Reviewed, with no updates at this time.   Family History: History reviewed. No pertinent family history.  Social History:  Social History   Socioeconomic History  . Marital status: Single     Spouse name: Not on file  . Number of children: Not on file  . Years of education: Not on file  . Highest education level: Not on file  Occupational History  . Not on file  Social Needs  . Financial resource strain: Not on file  . Food insecurity:    Worry: Not on file    Inability: Not on file  . Transportation needs:    Medical: Not on file    Non-medical: Not on file  Tobacco Use  . Smoking status: Never Smoker  . Smokeless tobacco: Never Used  Substance and Sexual Activity  . Alcohol use: Not Currently  . Drug use: No  . Sexual activity: Yes    Birth control/protection: None  Lifestyle  . Physical activity:    Days per week: Not on file    Minutes per session: Not on file  . Stress: Not on file  Relationships  . Social connections:    Talks on phone: Not on file    Gets together: Not on file    Attends religious service: Not on file    Active member of club or organization: Not on file    Attends meetings of clubs or organizations: Not on file    Relationship status: Not on file  Other Topics Concern  . Not on file  Social History Narrative  . Not on file    Allergies: No Known Allergies  Metabolic Disorder Labs:   Lab Results  Component Value Date   HGBA1C 6.5 (H) 05/07/2018   MPG 139.85 05/07/2018   No results found for: PROLACTIN Lab Results  Component Value Date   CHOL 181 05/07/2018   TRIG 92 05/07/2018   HDL 46 05/07/2018   CHOLHDL 3.9 05/07/2018   VLDL 18 05/07/2018   LDLCALC 117 (H) 05/07/2018   Lab Results  Component Value Date   TSH 3.019 05/07/2018    Therapeutic Level Labs: No results found for: LITHIUM No results found for: VALPROATE No components found for:  CBMZ  Current Medications: Current Outpatient Medications  Medication Sig Dispense Refill  . dapagliflozin propanediol (FARXIGA) 10 MG TABS tablet Take 10 mg by mouth daily.    . docusate sodium (COLACE) 100 MG capsule Take 1 capsule (100 mg total) by mouth 2 (two) times  daily. 60 capsule 1  . ferrous sulfate 325 (65 FE) MG tablet Take 1 tablet (325 mg total) by mouth daily with breakfast. 30 tablet 0  . FLUoxetine (PROZAC) 40 MG capsule Take 1 capsule (40 mg total) by mouth every morning. 90 capsule 0  . gabapentin (NEURONTIN) 300 MG capsule Take 1 capsule (300 mg total) by mouth 3 (three) times daily. 90 capsule 1  . metformin (FORTAMET) 1000 MG (OSM) 24 hr tablet Take 1 tablet (1,000 mg total) by mouth 2 (two) times daily with a meal. 60 tablet 1  . traZODone (DESYREL) 50 MG tablet Take 1 tablet (50 mg total) by mouth at bedtime. 90 tablet 0  . ARIPiprazole (ABILIFY) 5 MG tablet Take 1 tablet (5 mg total) by mouth every morning. 90 tablet 0   No current facility-administered medications for this visit.     Musculoskeletal: Strength & Muscle Tone: within normal limits Gait & Station: normal Patient leans: N/A  Psychiatric Specialty Exam: ROS  Blood pressure 110/70, pulse 68, height 5' 6" (1.676 m), weight 203 lb (92.1 kg), last menstrual period 05/29/2018.Body mass index is 32.77 kg/m.  General Appearance: Casual and Fairly Groomed  Eye Contact:  Fair  Speech:  Clear and Coherent and Normal Rate  Volume:  Normal  Mood:  Depressed and Dysphoric  Affect:  Depressed and Flat  Thought Process:  Goal Directed and Descriptions of Associations: Intact  Orientation:  Full (Time, Place, and Person)  Thought Content: Logical   Suicidal Thoughts:  No  Homicidal Thoughts:  No  Memory:  Immediate;   Fair  Judgement:  Fair  Insight:  Fair  Psychomotor Activity:  Normal, Psychomotor Retardation and baseline  Concentration:  Concentration: Fair  Recall:  Fair  Fund of Knowledge: Fair  Language: Fair  Akathisia:  Negative  Handed:  Right  AIMS (if indicated): not done  Assets:  Communication Skills Desire for Improvement Financial Resources/Insurance Housing Transportation Vocational/Educational  ADL's:  Intact  Cognition: WNL  Sleep:  Poor    Screenings: AIMS     Admission (Discharged) from [facility]14/2018 in [facility] Admission (Discharged[facility] Ad[facility] (Discharged) from 11/07/2016 in [facility]  AIMS Total Score  0  0  0    AUDIT     Admission (Discharged) from 06/02/2018 in [facility] Admission (Discharged) from 05/14/2018 in [facility] Admission (Discharged) from [facility] Admission (Discharged) from 11/07/2016 in [facility]  Alcohol Use Disorder Identification Test Final Score (AUDIT)  1    3  2  0    ECT-MADRS     Admission (Discharged) from 06/02/2018 in [facility] Most recent reading at 06/09/2018  9:33 AM ECT Treatment from 06/02/2018 in [facility] Most recent reading at 06/02/2018  9:33 AM Admission (Discharged) from 05/14/2018 in [facility] Most recent reading at 05/26/2018  9:24 AM ECT Treatment from 05/16/2018 in [facility] Most recent reading at 05/16/2018 10:21 AM  MADRS Total Score  21  19  18  19    Mini-Mental     Admission (Discharged) from 06/02/2018 in [facility] Most recent reading at 06/09/2018  9:33 AM ECT Treatment from 06/02/2018 in [facility] Most recent reading at 06/02/2018  9:28 AM Admission (Discharged) from 05/14/2018 in [facility] Most recent reading at 05/26/2018  9:24 AM ECT Treatment from 05/16/2018 in [facility] Most recent reading at 05/16/2018 10:21 AM  Total Score (max 30 points )  30  30  30  30    PHQ2-9     [facility]  PHQ-2 Total Score  5  PHQ-9 Total Score  23        Assessment and Plan:  Caniya Nyirwot Blackwelder presents with ongoing severe melancholic depression. Continues with approximately 30-50 % medication adherance. ECT nor TMS have proven effective for her symptoms. Spent time today discussing initiating of Abilify and transition to LAI after assess for tolerance in 2 weeks.  No acute SI, she recently adopted a puppy. I will support emotional support animal if patient wishes to apply for this.   1. Severe recurrent major depression without psychotic features ([name])     Status of current problems: unchanged  Labs Ordered: No orders of the defined types were placed in this encounter.   Labs Reviewed: n/a  Collateral Obtained/Records Reviewed: n/a  Plan:  Prozac 40 mg daily Trazodone 50 mg nightly prn Abilify 5 mg daily Switch to Abilify LAI after assessing response/tolerabiltiy RTC 2 weeks   Arya , MD 06/11/2018, 9:31 AM 

## 2018-06-18 ENCOUNTER — Telehealth: Payer: Self-pay

## 2018-06-22 ENCOUNTER — Other Ambulatory Visit: Payer: Self-pay | Admitting: Psychiatry

## 2018-06-23 ENCOUNTER — Encounter
Admission: RE | Admit: 2018-06-23 | Discharge: 2018-06-23 | Disposition: A | Discharge status: Home / Self Care | Payer: BLUE CROSS/BLUE SHIELD | Source: Ambulatory Visit | Attending: Psychiatry | Admitting: Psychiatry

## 2018-06-23 ENCOUNTER — Encounter: Payer: Self-pay | Admitting: Anesthesiology

## 2018-06-23 ENCOUNTER — Telehealth: Payer: Self-pay

## 2018-06-23 DIAGNOSIS — F419 Anxiety disorder, unspecified: Secondary | ICD-10-CM | POA: Diagnosis not present

## 2018-06-23 DIAGNOSIS — F4310 Post-traumatic stress disorder, unspecified: Secondary | ICD-10-CM | POA: Diagnosis not present

## 2018-06-23 DIAGNOSIS — F332 Major depressive disorder, recurrent severe without psychotic features: Secondary | ICD-10-CM

## 2018-06-23 DIAGNOSIS — E119 Type 2 diabetes mellitus without complications: Secondary | ICD-10-CM | POA: Diagnosis not present

## 2018-06-23 LAB — URINALYSIS, COMPLETE (UACMP) WITH MICROSCOPIC
BACTERIA UA: NONE SEEN
Bilirubin Urine: NEGATIVE
Hgb urine dipstick: NEGATIVE
KETONES UR: NEGATIVE mg/dL
LEUKOCYTES UA: NEGATIVE
Nitrite: NEGATIVE
PROTEIN: 30 mg/dL — AB
Specific Gravity, Urine: 1.031 — ABNORMAL HIGH (ref 1.005–1.030)
WBC UA: NONE SEEN WBC/hpf (ref 0–5)
pH: 5 (ref 5.0–8.0)

## 2018-06-23 LAB — GLUCOSE, CAPILLARY: GLUCOSE-CAPILLARY: 125 mg/dL — AB (ref 70–99)

## 2018-06-23 MED ORDER — KETOROLAC TROMETHAMINE 30 MG/ML IJ SOLN
30.0000 mg | Freq: Once | INTRAMUSCULAR | Status: AC
  Administered 2018-06-23: 30 mg via INTRAVENOUS

## 2018-06-23 MED ORDER — METHOHEXITAL SODIUM 100 MG/10ML IV SOSY
PREFILLED_SYRINGE | INTRAVENOUS | Status: DC | PRN
  Administered 2018-06-23: 90 mg via INTRAVENOUS
  Administered 2018-06-23: 10 mg via INTRAVENOUS
  Administered 2018-06-23: 30 mg via INTRAVENOUS
  Administered 2018-06-23: 20 mg via INTRAVENOUS

## 2018-06-23 MED ORDER — ESMOLOL HCL 100 MG/10ML IV SOLN
INTRAVENOUS | Status: AC
  Filled 2018-06-23: qty 10

## 2018-06-23 MED ORDER — SODIUM CHLORIDE 0.9 % IV SOLN
500.0000 mL | Freq: Once | INTRAVENOUS | Status: AC
  Administered 2018-06-23: 500 mL via INTRAVENOUS

## 2018-06-23 MED ORDER — LABETALOL HCL 5 MG/ML IV SOLN
INTRAVENOUS | Status: AC
  Filled 2018-06-23: qty 4

## 2018-06-23 MED ORDER — KETOROLAC TROMETHAMINE 30 MG/ML IJ SOLN
INTRAMUSCULAR | Status: AC
  Administered 2018-06-23: 30 mg via INTRAVENOUS
  Filled 2018-06-23: qty 1

## 2018-06-23 MED ORDER — GLYCOPYRROLATE 0.2 MG/ML IJ SOLN
0.1000 mg | Freq: Once | INTRAMUSCULAR | Status: AC
  Administered 2018-06-23: 0.1 mg via INTRAVENOUS

## 2018-06-23 MED ORDER — ESMOLOL HCL 100 MG/10ML IV SOLN
INTRAVENOUS | Status: DC | PRN
  Administered 2018-06-23: 30 mg via INTRAVENOUS

## 2018-06-23 MED ORDER — LABETALOL HCL 5 MG/ML IV SOLN
INTRAVENOUS | Status: DC | PRN
  Administered 2018-06-23: 10 mg via INTRAVENOUS

## 2018-06-23 MED ORDER — SUCCINYLCHOLINE CHLORIDE 20 MG/ML IJ SOLN
INTRAMUSCULAR | Status: AC
  Filled 2018-06-23: qty 1

## 2018-06-23 MED ORDER — SODIUM CHLORIDE 0.9 % IV SOLN
INTRAVENOUS | Status: DC | PRN
  Administered 2018-06-23: 10:00:00 via INTRAVENOUS

## 2018-06-23 MED ORDER — SUCCINYLCHOLINE CHLORIDE 200 MG/10ML IV SOSY
PREFILLED_SYRINGE | INTRAVENOUS | Status: DC | PRN
  Administered 2018-06-23: 100 mg via INTRAVENOUS

## 2018-06-23 MED ORDER — GLYCOPYRROLATE 0.2 MG/ML IJ SOLN
INTRAMUSCULAR | Status: AC
  Administered 2018-06-23: 0.1 mg via INTRAVENOUS
  Filled 2018-06-23: qty 1

## 2018-06-23 NOTE — Anesthesia Postprocedure Evaluation (Signed)
Anesthesia Post Note  Patient: Renee Turner  Procedure(s) Performed: ECT TX  Patient location during evaluation: PACU Anesthesia Type: General Level of consciousness: awake and alert Pain management: pain level controlled Vital Signs Assessment: post-procedure vital signs reviewed and stable Respiratory status: spontaneous breathing, nonlabored ventilation, respiratory function stable and patient connected to nasal cannula oxygen Cardiovascular status: blood pressure returned to baseline and stable Postop Assessment: no apparent nausea or vomiting Anesthetic complications: no     Last Vitals:  Vitals:   06/23/18 1145 06/23/18 1152  BP:  105/79  Pulse: 89 74  Resp: 19 16  Temp:  36.9 C  SpO2: 100%     Last Pain:  Vitals:   06/23/18 1152  TempSrc:   PainSc: 0-No pain                 Precious Haws Piscitello

## 2018-06-23 NOTE — Transfer of Care (Signed)
Immediate Anesthesia Transfer of Care Note  Patient: Renee Turner  Procedure(s) Performed: ECT TX  Patient Location: PACU  Anesthesia Type:General  Level of Consciousness: sedated  Airway & Oxygen Therapy: Patient Spontanous Breathing and Patient connected to face mask oxygen  Post-op Assessment: Report given to RN and Post -op Vital signs reviewed and stable  Post vital signs: Reviewed and stable  Last Vitals:  Vitals Value Taken Time  BP 96/62 06/23/2018 11:06 AM  Temp    Pulse 77 06/23/2018 11:09 AM  Resp 17 06/23/2018 11:09 AM  SpO2 100 % 06/23/2018 11:09 AM  Vitals shown include unvalidated device data.  Last Pain:  Vitals:   06/23/18 0852  TempSrc: Oral         Complications: No apparent anesthesia complications

## 2018-06-23 NOTE — Anesthesia Preprocedure Evaluation (Addendum)
Anesthesia Evaluation  Patient identified by MRN, date of birth, ID band Patient awake    Reviewed: Allergy & Precautions, H&P , NPO status , Patient's Chart, lab work & pertinent test results, reviewed documented beta blocker date and time   Airway Mallampati: II   Neck ROM: full    Dental  (+) Poor Dentition, Chipped   Pulmonary neg pulmonary ROS,    Pulmonary exam normal        Cardiovascular negative cardio ROS Normal cardiovascular exam Rhythm:regular Rate:Normal     Neuro/Psych negative neurological ROS  negative psych ROS   GI/Hepatic negative GI ROS, Neg liver ROS,   Endo/Other  negative endocrine ROSdiabetes  Renal/GU negative Renal ROS  negative genitourinary   Musculoskeletal   Abdominal   Peds  Hematology negative hematology ROS (+)   Anesthesia Other Findings Past Medical History: No date: Anxiety No date: Depression No date: Diabetes mellitus without complication ([name])     Comment:  Type II No date: Diabetes mellitus, type II ([name]) History reviewed. No pertinent surgical history. BMI    Body Mass Index:  32.12 kg/m     Reproductive/Obstetrics negative OB ROS                                                             Anesthesia Evaluation  Patient identified by MRN, date of birth, ID band Patient awake    Reviewed: Allergy & Precautions, NPO status , Patient's Chart, lab work & pertinent test results  History of Anesthesia Complications Negative for: history of anesthetic complications  Airway Mallampati: II  TM Distance: >3 FB Neck ROM: full    Dental  (+) Poor Dentition, Chipped   Pulmonary neg pulmonary ROS, neg shortness of breath,    Pulmonary exam normal breath sounds clear to auscultation       Cardiovascular (-) Past MI negative cardio ROS Normal cardiovascular exam Rhythm:Regular Rate:Normal     Neuro/Psych PSYCHIATRIC DISORDERS  Anxiety Depression negative neurological ROS  negative psych ROS   GI/Hepatic negative GI ROS, Neg liver ROS,   Endo/Other  diabetes, Well Controlled, Type 2  Renal/GU negative Renal ROS  negative genitourinary   Musculoskeletal negative musculoskeletal ROS (+)   Abdominal Normal abdominal exam  (+)   Peds negative pediatric ROS (+)  Hematology negative hematology ROS (+)   Anesthesia Other Findings Past Medical History: No date: Anxiety No date: Depression No date: Diabetes mellitus without complication ([name])     Comment:  Type II No date: Diabetes mellitus, type II ([name])  BMI    Body Mass Index:  32.93 kg/m      Reproductive/Obstetrics negative OB ROS                             Anesthesia Physical  Anesthesia Plan  ASA: III  Anesthesia Plan: General   Post-op Pain Management:    Induction: Intravenous  PONV Risk Score and Plan: TIVA  Airway Management Planned: Mask and Natural Airway  Additional Equipment:   Intra-op Plan:   Post-operative Plan:   Informed Consent: I have reviewed the patients History and Physical, chart, labs and discussed the procedure including the risks, benefits and alternatives for the proposed anesthesia with the patient or authorized representative who  has indicated his/her understanding and acceptance.   Dental advisory given  Plan Discussed with: CRNA and Surgeon  Anesthesia Plan Comments: (Patient consented for risks of anesthesia including but not limited to:  - adverse reactions to medications - risk of intubation if required - damage to teeth, lips or other oral mucosa - sore throat or hoarseness - Damage to heart, brain, lungs or loss of life  Patient voiced understanding.)        Anesthesia Quick Evaluation                                   Anesthesia Evaluation  Patient identified by MRN, date of birth, ID band Patient awake    Reviewed: Allergy & Precautions, H&P , NPO  status , Patient's Chart, lab work & pertinent test results, reviewed documented beta blocker date and time   Airway Mallampati: II   Neck ROM: full    Dental  (+) Poor Dentition   Pulmonary neg pulmonary ROS,    Pulmonary exam normal        Cardiovascular negative cardio ROS Normal cardiovascular exam Rhythm:regular Rate:Normal     Neuro/Psych negative neurological ROS  negative psych ROS   GI/Hepatic negative GI ROS, Neg liver ROS,   Endo/Other  negative endocrine ROSdiabetes  Renal/GU negative Renal ROS  negative genitourinary   Musculoskeletal   Abdominal   Peds  Hematology negative hematology ROS (+)   Anesthesia Other Findings Past Medical History: No date: Anxiety No date: Depression No date: Diabetes mellitus without complication ([name])     Comment:  Type II No date: Diabetes mellitus, type II ([name]) History reviewed. No pertinent surgical history. BMI    Body Mass Index:  32.12 kg/m     Reproductive/Obstetrics negative OB ROS                             Anesthesia Physical  Anesthesia Plan  ASA: II  Anesthesia Plan: General   Post-op Pain Management:    Induction:   PONV Risk Score and Plan:   Airway Management Planned: Mask  Additional Equipment:   Intra-op Plan:   Post-operative Plan:   Informed Consent: I have reviewed the patients History and Physical, chart, labs and discussed the procedure including the risks, benefits and alternatives for the proposed anesthesia with the patient or authorized representative who has indicated his/her understanding and acceptance.   Dental Advisory Given  Plan Discussed with: CRNA  Anesthesia Plan Comments:         Anesthesia Quick Evaluation  Anesthesia Physical  Anesthesia Plan  ASA: III  Anesthesia Plan: General   Post-op Pain Management:    Induction: Intravenous  PONV Risk Score and Plan: TIVA  Airway Management Planned: Mask and  Natural Airway  Additional Equipment:   Intra-op Plan:   Post-operative Plan:   Informed Consent: I have reviewed the patients History and Physical, chart, labs and discussed the procedure including the risks, benefits and alternatives for the proposed anesthesia with the patient or authorized representative who has indicated his/her understanding and acceptance.   Dental Advisory Given  Plan Discussed with: CRNA  Anesthesia Plan Comments: (Patient consented for risks of anesthesia including but not limited to:  - adverse reactions to medications - risk of intubation if required - damage to teeth, lips or other oral mucosa - sore throat or hoarseness - Damage to  heart, brain, lungs or loss of life  Patient voiced understanding.)       Anesthesia Quick Evaluation

## 2018-06-23 NOTE — H&P (Signed)
Renee Turner is an 22 y.o. female.   Chief Complaint: Patient has no specific new complaint.  No physical complaint.  Mood is doing reasonably well still has some anxiety HPI: History of recurrent severe depression and PTSD suicidality has had good response to ECT and medicine.  Past Medical History:  Diagnosis Date  . Anxiety   . Depression   . Diabetes mellitus without complication ([place])    Type II  . Diabetes mellitus, type II ([place])     History reviewed. No pertinent surgical history.  History reviewed. No pertinent family history. Social History:  reports that she has never smoked. She has never used smokeless tobacco. She reports that she drank alcohol. She reports that she does not use drugs.  Allergies: No Known Allergies   (Not in a hospital admission)  No results found for this or any previous visit (from the past 48 hour(s)). No results found.  Review of Systems  Constitutional: Negative.   HENT: Negative.   Eyes: Negative.   Respiratory: Negative.   Cardiovascular: Negative.   Gastrointestinal: Negative.   Musculoskeletal: Negative.   Skin: Negative.   Neurological: Negative.   Psychiatric/Behavioral: Negative for depression, hallucinations, memory loss, substance abuse and suicidal ideas. The patient is nervous/anxious. The patient does not have insomnia.     Blood pressure 110/78, pulse 88, temperature 98.3 F (36.8 C), temperature source Oral, resp. rate 16, height '5\' 6"'  (1.676 m), weight 90.3 kg, last menstrual period 05/29/2018, SpO2 98 %. Physical Exam  Nursing note and vitals reviewed. Constitutional: She appears well-developed and well-nourished.  HENT:  Head: Normocephalic and atraumatic.  Eyes: Pupils are equal, round, and reactive to light. Conjunctivae are normal.  Neck: Normal range of motion.  Cardiovascular: Regular rhythm and normal heart sounds.  Respiratory: Effort normal. No respiratory distress.  GI: Soft.  Musculoskeletal:  Normal range of motion.  Neurological: She is alert.  Skin: Skin is warm and dry.  Psychiatric: She has a normal mood and affect. Her speech is normal. Judgment normal. She is slowed. Thought content is not paranoid. Cognition and memory are normal. She expresses no homicidal and no suicidal ideation.     Assessment/Plan Review treatment plan follow-up in about 2 weeks or 3 weeks given the holiday.  Alethia Berthold, MD 06/23/2018, 10:40 AM

## 2018-06-23 NOTE — Anesthesia Procedure Notes (Signed)
Date/Time: 06/23/2018 10:54 AM Performed by: Lily KocherPeralta, Kaylyn Garrow, CRNA Pre-anesthesia Checklist: Patient identified, Emergency Drugs available, Suction available and Patient being monitored Patient Re-evaluated:Patient Re-evaluated prior to induction Oxygen Delivery Method: Circle system utilized Preoxygenation: Pre-oxygenation with 100% oxygen Induction Type: IV induction Ventilation: Mask ventilation without difficulty and Mask ventilation throughout procedure Airway Equipment and Method: Bite block Placement Confirmation: positive ETCO2 Dental Injury: Teeth and Oropharynx as per pre-operative assessment

## 2018-06-23 NOTE — Anesthesia Post-op Follow-up Note (Signed)
Anesthesia QCDR form completed.        

## 2018-06-23 NOTE — Procedures (Signed)
ECT SERVICES Physician's Interval Evaluation & Treatment Note  Patient Identification: Rupal Childress Bozza MRN:  [id number] Date of Evaluation:  06/23/2018 TX #: 11  MADRS:   MMSE:   P.E. Findings:  No change to physical exam  Psychiatric Interval Note:  Patient presents as smiling more a little more articulate feeling better.  Seems to be more active.  Subjective:  Patient is a 22 y.o. female seen for evaluation for Electroconvulsive Therapy. No suicidal ideation mood feels a little better  Treatment Summary:   '[x]'   Right Unilateral             '[]'  Bilateral   % Energy : 0.3 ms 50%   Impedance: 1320 ohms  Seizure Energy Index: 14,983 V squared  Postictal Suppression Index: 95%  Seizure Concordance Index: 98%  Medications  Pre Shock: Robinul 0.1 mg labetalol 10 mg esmolol 30 mg Toradol 30 mg.  Brevital dose was increased to 150 mg because of inadequate sedation at the previous dose.  We will go with 150 going forward.  Succinylcholine 100 mg  Post Shock: None  Seizure Duration: 35 seconds by EMG 97 seconds by EEG   Comments: Seems to be doing well no change to overall treatment we will see her back in 3 weeks.  Lungs:  '[x]'   Clear to auscultation               '[]'  Other:   Heart:    '[x]'   Regular rhythm             '[]'  irregular rhythm    '[x]'   Previous H&P reviewed, patient examined and there are NO CHANGES                 '[]'   Previous H&P reviewed, patient examined and there are changes noted.   Alethia Berthold, MD 8/19/201910:41 AM

## 2018-06-23 NOTE — Discharge Instructions (Signed)
1)  The drugs that you have been given will stay in your system until tomorrow so for the       next 24 hours you should not:  A. Drive an automobile  B. Make any legal decisions  C. Drink any alcoholic beverages  2)  You may resume your regular meals upon return home.  3)  A responsible adult must take you home.  Someone should stay with you for a few          hours, then be available by phone for the remainder of the treatment day.  4)  You May experience any of the following symptoms:  Headache, Nausea and a dry mouth (due to the medications you were given),  temporary memory loss and some confusion, or sore muscles (a warm bath  should help this).  If you you experience any of these symptoms let us know on                your return visit.  5)  Report any of the following: any acute discomfort, severe headache, or temperature        greater than 100.5 F.   Also report any unusual redness, swelling, drainage, or pain         at your IV site.    You may report Symptoms to:  ECT [facility]- [place] [facility]          Phone: [phone], ECT Department           or Dr. Shary Keylapac's office [phone]  6)  Your next ECT Treatment is Monday September 9   We will call 2 days prior to your scheduled appointment for arrival times.  7)  Nothing to eat or drink after midnight the night before your procedure.  8)  Take     With a sip of water the morning of your procedure.  9)  Other Instructions: Call [phone] to cancel the morning of your procedure due         to illness or emergency.  10) We will call within 72 hours to assess how you are feeling.

## 2018-06-23 NOTE — Progress Notes (Signed)
Patient ID: Renee Turner, female   DOB: November 23, 1995, 22 y.o.   MRN: [id number] [name], DOB 1996-08-06, needed to miss work for 06/23/2018 and 06/24/2018 for medical reason. Please excuse her from work for those days. [name],M.D. [phone]

## 2018-06-24 ENCOUNTER — Ambulatory Visit (HOSPITAL_COMMUNITY): Payer: BLUE CROSS/BLUE SHIELD | Admitting: Psychiatry

## 2018-07-09 ENCOUNTER — Telehealth: Payer: Self-pay | Admitting: *Deleted

## 2018-07-13 ENCOUNTER — Other Ambulatory Visit: Payer: Self-pay | Admitting: Psychiatry

## 2018-07-14 ENCOUNTER — Encounter
Admission: RE | Admit: 2018-07-14 | Discharge: 2018-07-14 | Disposition: A | Discharge status: Home / Self Care | Payer: BLUE CROSS/BLUE SHIELD | Source: Ambulatory Visit | Attending: Psychiatry | Admitting: Psychiatry

## 2018-07-14 ENCOUNTER — Encounter: Payer: Self-pay | Admitting: Anesthesiology

## 2018-07-14 DIAGNOSIS — Z915 Personal history of self-harm: Secondary | ICD-10-CM | POA: Diagnosis not present

## 2018-07-14 DIAGNOSIS — F339 Major depressive disorder, recurrent, unspecified: Secondary | ICD-10-CM | POA: Diagnosis not present

## 2018-07-14 DIAGNOSIS — F419 Anxiety disorder, unspecified: Secondary | ICD-10-CM | POA: Insufficient documentation

## 2018-07-14 DIAGNOSIS — E119 Type 2 diabetes mellitus without complications: Secondary | ICD-10-CM | POA: Diagnosis not present

## 2018-07-14 DIAGNOSIS — F332 Major depressive disorder, recurrent severe without psychotic features: Secondary | ICD-10-CM

## 2018-07-14 LAB — GLUCOSE, CAPILLARY
GLUCOSE-CAPILLARY: 94 mg/dL (ref 70–99)
Glucose-Capillary: 118 mg/dL — ABNORMAL HIGH (ref 70–99)

## 2018-07-14 LAB — POCT PREGNANCY, URINE: Preg Test, Ur: NEGATIVE

## 2018-07-14 MED ORDER — ESMOLOL HCL 100 MG/10ML IV SOLN
INTRAVENOUS | Status: AC
  Filled 2018-07-14: qty 10

## 2018-07-14 MED ORDER — GLYCOPYRROLATE 0.2 MG/ML IJ SOLN
0.1000 mg | Freq: Once | INTRAMUSCULAR | Status: AC
  Administered 2018-07-14: 0.1 mg via INTRAVENOUS

## 2018-07-14 MED ORDER — KETOROLAC TROMETHAMINE 30 MG/ML IJ SOLN
30.0000 mg | Freq: Once | INTRAMUSCULAR | Status: AC
  Administered 2018-07-14: 30 mg via INTRAVENOUS

## 2018-07-14 MED ORDER — LABETALOL HCL 5 MG/ML IV SOLN
INTRAVENOUS | Status: DC | PRN
  Administered 2018-07-14: 10 mg via INTRAVENOUS

## 2018-07-14 MED ORDER — SUCCINYLCHOLINE CHLORIDE 200 MG/10ML IV SOSY
PREFILLED_SYRINGE | INTRAVENOUS | Status: DC | PRN
  Administered 2018-07-14: 100 mg via INTRAVENOUS

## 2018-07-14 MED ORDER — SODIUM CHLORIDE 0.9 % IV SOLN
500.0000 mL | Freq: Once | INTRAVENOUS | Status: AC
  Administered 2018-07-14: 500 mL via INTRAVENOUS

## 2018-07-14 MED ORDER — SUCCINYLCHOLINE CHLORIDE 20 MG/ML IJ SOLN
INTRAMUSCULAR | Status: AC
  Filled 2018-07-14: qty 1

## 2018-07-14 MED ORDER — LABETALOL HCL 5 MG/ML IV SOLN
INTRAVENOUS | Status: AC
  Filled 2018-07-14: qty 4

## 2018-07-14 MED ORDER — METHOHEXITAL SODIUM 100 MG/10ML IV SOSY
PREFILLED_SYRINGE | INTRAVENOUS | Status: DC | PRN
  Administered 2018-07-14: 150 mg via INTRAVENOUS

## 2018-07-14 MED ORDER — ESMOLOL HCL 100 MG/10ML IV SOLN
INTRAVENOUS | Status: DC | PRN
  Administered 2018-07-14: 30 mg via INTRAVENOUS

## 2018-07-14 MED ORDER — GLYCOPYRROLATE 0.2 MG/ML IJ SOLN
INTRAMUSCULAR | Status: AC
  Filled 2018-07-14: qty 1

## 2018-07-14 MED ORDER — SODIUM CHLORIDE 0.9 % IV SOLN
INTRAVENOUS | Status: DC | PRN
  Administered 2018-07-14: 12:00:00 via INTRAVENOUS

## 2018-07-14 MED ORDER — KETOROLAC TROMETHAMINE 30 MG/ML IJ SOLN
INTRAMUSCULAR | Status: AC
  Administered 2018-07-14: 30 mg via INTRAVENOUS
  Filled 2018-07-14: qty 1

## 2018-07-14 NOTE — Transfer of Care (Signed)
Immediate Anesthesia Transfer of Care Note  Patient: Renee Turner  Procedure(s) Performed: ECT TX  Patient Location: PACU  Anesthesia Type:General  Level of Consciousness: sedated  Airway & Oxygen Therapy: Patient Spontanous Breathing and Patient connected to face mask oxygen  Post-op Assessment: Report given to RN and Post -op Vital signs reviewed and stable  Post vital signs: Reviewed and stable  Last Vitals:  Vitals Value Taken Time  BP 108/73 07/14/2018 12:03 PM  Temp 36.3 C 07/14/2018 12:03 PM  Pulse 71 07/14/2018 12:04 PM  Resp 16 07/14/2018 12:04 PM  SpO2 100 % 07/14/2018 12:04 PM  Vitals shown include unvalidated device data.  Last Pain:  Vitals:   07/14/18 1203  TempSrc:   PainSc: Asleep         Complications: No apparent anesthesia complications

## 2018-07-14 NOTE — Discharge Instructions (Signed)
1)  The drugs that you have been given will stay in your system until tomorrow so for the       next 24 hours you should not:  A. Drive an automobile  B. Make any legal decisions  C. Drink any alcoholic beverages  2)  You may resume your regular meals upon return home.  3)  A responsible adult must take you home.  Someone should stay with you for a few          hours, then be available by phone for the remainder of the treatment day.  4)  You May experience any of the following symptoms:  Headache, Nausea and a dry mouth (due to the medications you were given),  temporary memory loss and some confusion, or sore muscles (a warm bath  should help this).  If you you experience any of these symptoms let us know on                your return visit.  5)  Report any of the following: any acute discomfort, severe headache, or temperature        greater than 100.5 F.   Also report any unusual redness, swelling, drainage, or pain         at your IV site.    You may report Symptoms to:  ECT PROGRAM- [place] at [facility]          Phone: [phone], ECT Department           or Dr. Shary Key office [phone]  6)  Your next ECT Treatment is Day Monday  Date October 7  We will call 2 days prior to your scheduled appointment for arrival times.  7)  Nothing to eat or drink after midnight the night before your procedure.  8)  Take .     With a sip of water the morning of your procedure.  9)  Other Instructions: Call [phone] to cancel the morning of your procedure due         to illness or emergency.  10) We will call within 72 hours to assess how you are feeling.

## 2018-07-14 NOTE — Anesthesia Preprocedure Evaluation (Signed)
Anesthesia Evaluation  Patient identified by MRN, date of birth, ID band Patient awake    Reviewed: Allergy & Precautions, H&P , NPO status , reviewed documented beta blocker date and time   Airway Mallampati: II  TM Distance: >3 FB Neck ROM: full    Dental  (+) Poor Dentition, Chipped   Pulmonary    Pulmonary exam normal        Cardiovascular Normal cardiovascular exam     Neuro/Psych PSYCHIATRIC DISORDERS Anxiety Depression    GI/Hepatic   Endo/Other  diabetes  Renal/GU      Musculoskeletal   Abdominal   Peds  Hematology   Anesthesia Other Findings Past Medical History: No date: Anxiety No date: Depression No date: Diabetes mellitus without complication ([name])     Comment:  Type II No date: Diabetes mellitus, type II ([name])  History reviewed. No pertinent surgical history.  BMI    Body Mass Index:  33.25 kg/m      Reproductive/Obstetrics                             Anesthesia Physical Anesthesia Plan  ASA: III  Anesthesia Plan: General   Post-op Pain Management:    Induction: Intravenous  PONV Risk Score and Plan: Treatment may vary due to age or medical condition and TIVA  Airway Management Planned: Nasal Cannula and Natural Airway  Additional Equipment:   Intra-op Plan:   Post-operative Plan:   Informed Consent: I have reviewed the patients History and Physical, chart, labs and discussed the procedure including the risks, benefits and alternatives for the proposed anesthesia with the patient or authorized representative who has indicated his/her understanding and acceptance.   Dental Advisory Given  Plan Discussed with: CRNA  Anesthesia Plan Comments:         Anesthesia Quick Evaluation

## 2018-07-14 NOTE — H&P (Signed)
Renee Turner is an 22 y.o. female.   Chief Complaint: Patient has really no clear specific complaint.  No physical complaint.  Feels like her depression is not as bad. HPI: History of recurrent depression and past suicide attempts.  Past Medical History:  Diagnosis Date  . Anxiety   . Depression   . Diabetes mellitus without complication ([place])    Type II  . Diabetes mellitus, type II ([place])     History reviewed. No pertinent surgical history.  History reviewed. No pertinent family history. Social History:  reports that she has never smoked. She has never used smokeless tobacco. She reports that she drank alcohol. She reports that she does not use drugs.  Allergies: No Known Allergies   (Not in a hospital admission)  Results for orders placed or performed during the hospital encounter of 07/14/18 (from the past 48 hour(s))  Pregnancy, urine POC     Status: None   Collection Time: 07/14/18  8:46 AM  Result Value Ref Range   Preg Test, Ur NEGATIVE NEGATIVE    Comment:        THE SENSITIVITY OF THIS METHODOLOGY IS >24 mIU/mL   Glucose, capillary     Status: Abnormal   Collection Time: 07/14/18  9:24 AM  Result Value Ref Range   Glucose-Capillary 118 (H) 70 - 99 mg/dL   No results found.  Review of Systems  Constitutional: Negative.   HENT: Negative.   Eyes: Negative.   Respiratory: Negative.   Cardiovascular: Negative.   Gastrointestinal: Negative.   Musculoskeletal: Negative.   Skin: Negative.   Neurological: Negative.   Psychiatric/Behavioral: Negative.     Blood pressure 104/68, pulse 75, temperature (!) 97.2 F (36.2 C), temperature source Tympanic, resp. rate 18, height _0  (1.676 m), weight 93.4 kg, last menstrual period 06/30/2018, SpO2 98 %. Physical Exam  Nursing note and vitals reviewed. Constitutional: She appears well-developed and well-nourished.  HENT:  Head: Normocephalic and atraumatic.  Eyes: Pupils are equal, round, and reactive to  light. Conjunctivae are normal.  Neck: Normal range of motion.  Cardiovascular: Regular rhythm and normal heart sounds.  Respiratory: Effort normal. No respiratory distress.  GI: Soft.  Musculoskeletal: Normal range of motion.  Neurological: She is alert.  Skin: Skin is warm and dry.  Psychiatric: Her affect is blunt. Her speech is delayed. She is slowed. Thought content is not paranoid. Cognition and memory are normal. She expresses inappropriate judgment. She expresses no homicidal and no suicidal ideation.     Assessment/Plan Patient will receive treatment today and we are going to schedule her again in a month.  I have tried to impress upon her the importance of staying compliant with medication as well as therapy.  Alethia Berthold, MD 07/14/2018, 11:32 AM

## 2018-07-14 NOTE — Anesthesia Procedure Notes (Signed)
Date/Time: 07/14/2018 11:51 AM Performed by: Lily Kocher, CRNA Pre-anesthesia Checklist: Patient identified, Emergency Drugs available, Suction available and Patient being monitored Patient Re-evaluated:Patient Re-evaluated prior to induction Oxygen Delivery Method: Circle system utilized Preoxygenation: Pre-oxygenation with 100% oxygen Induction Type: IV induction Ventilation: Mask ventilation without difficulty and Mask ventilation throughout procedure Airway Equipment and Method: Bite block Placement Confirmation: positive ETCO2 Dental Injury: Teeth and Oropharynx as per pre-operative assessment

## 2018-07-14 NOTE — Anesthesia Post-op Follow-up Note (Signed)
Anesthesia QCDR form completed.        

## 2018-07-14 NOTE — Procedures (Signed)
ECT SERVICES Physician's Interval Evaluation & Treatment Note  Patient Identification: Renee Turner MRN:  [id number] Date of Evaluation:  07/14/2018 TX #: 12  MADRS:   MMSE:   P.E. Findings:  No change to physical exam.  Vitals stable.  Heart and lungs normal.  No neurologic problems.  Psychiatric Interval Note:  Seems to be pretty stable.  Not reporting depression or suicidal thoughts or psychosis.  Subjective:  Patient is a 22 y.o. female seen for evaluation for Electroconvulsive Therapy. Patient is noncommittal but does not seem to be severely depressed.  Treatment Summary:   [x]  Right Unilateral             [] Bilateral   % Energy : 0.3 ms 50%   Impedance: 1200 ohms  Seizure Energy Index: 9630 V squared  Postictal Suppression Index: 93%  Seizure Concordance Index: 98%  Medications  Pre Shock: Robinul 0.1 mg labetalol 10 mg esmolol 30 mg Toradol 30 mg Brevital 150 mg succinylcholine 100 mg  Post Shock:    Seizure Duration: 28 seconds by EMG 49 seconds by EEG   Comments: I am going to have her come back in 1 month and then reassess to see if this is really worthwhile to continue to pursue longer-term.  Lungs:  [x]  Clear to auscultation               [] Other:   Heart:    [x]  Regular rhythm             [] irregular rhythm    [x]  Previous H&P reviewed, patient examined and there are NO CHANGES                 []  Previous H&P reviewed, patient examined and there are changes noted.    , MD 9/9/201911:34 AM    

## 2018-07-15 NOTE — Anesthesia Postprocedure Evaluation (Signed)
Anesthesia Post Note  Patient: Sruthi Nyirwot Biby  Procedure(s) Performed: ECT TX  Patient location during evaluation: PACU Anesthesia Type: General Level of consciousness: awake and alert Pain management: pain level controlled Vital Signs Assessment: post-procedure vital signs reviewed and stable Respiratory status: spontaneous breathing, nonlabored ventilation and respiratory function stable Cardiovascular status: blood pressure returned to baseline and stable Postop Assessment: no apparent nausea or vomiting Anesthetic complications: no     Last Vitals:  Vitals:   07/14/18 1233 07/14/18 1245  BP: 109/73 101/71  Pulse: 71 84  Resp: 18 16  Temp: (!) 36.3 C   SpO2: 100%     Last Pain:  Vitals:   07/14/18 1245  TempSrc:   PainSc: 0-No pain                 Alphonsus Sias

## 2018-08-04 ENCOUNTER — Telehealth: Payer: Self-pay

## 2018-08-11 ENCOUNTER — Encounter
Admission: RE | Admit: 2018-08-11 | Discharge: 2018-08-11 | Disposition: A | Discharge status: Home / Self Care | Payer: BLUE CROSS/BLUE SHIELD | Source: Ambulatory Visit | Attending: Psychiatry | Admitting: Psychiatry

## 2018-08-11 ENCOUNTER — Other Ambulatory Visit: Payer: Self-pay | Admitting: Psychiatry

## 2018-08-11 ENCOUNTER — Encounter: Payer: Self-pay | Admitting: Anesthesiology

## 2018-08-11 DIAGNOSIS — R45851 Suicidal ideations: Secondary | ICD-10-CM | POA: Insufficient documentation

## 2018-08-11 DIAGNOSIS — E119 Type 2 diabetes mellitus without complications: Secondary | ICD-10-CM | POA: Diagnosis not present

## 2018-08-11 DIAGNOSIS — F339 Major depressive disorder, recurrent, unspecified: Secondary | ICD-10-CM | POA: Diagnosis not present

## 2018-08-11 LAB — POCT PREGNANCY, URINE: Preg Test, Ur: NEGATIVE

## 2018-08-11 LAB — GLUCOSE, CAPILLARY: Glucose-Capillary: 133 mg/dL — ABNORMAL HIGH (ref 70–99)

## 2018-08-11 MED ORDER — SODIUM CHLORIDE 0.9 % IV SOLN
INTRAVENOUS | Status: DC | PRN
  Administered 2018-08-11: 09:00:00 via INTRAVENOUS

## 2018-08-11 MED ORDER — METHOHEXITAL SODIUM 0.5 G IJ SOLR
INTRAMUSCULAR | Status: AC
  Filled 2018-08-11: qty 500

## 2018-08-11 MED ORDER — SODIUM CHLORIDE 0.9 % IV SOLN
500.0000 mL | Freq: Once | INTRAVENOUS | Status: AC
  Administered 2018-08-11: 500 mL via INTRAVENOUS

## 2018-08-11 MED ORDER — KETOROLAC TROMETHAMINE 30 MG/ML IJ SOLN
INTRAMUSCULAR | Status: AC
  Administered 2018-08-11: 30 mg via INTRAVENOUS
  Filled 2018-08-11: qty 1

## 2018-08-11 MED ORDER — KETOROLAC TROMETHAMINE 30 MG/ML IJ SOLN
30.0000 mg | Freq: Once | INTRAMUSCULAR | Status: AC
  Administered 2018-08-11: 30 mg via INTRAVENOUS

## 2018-08-11 MED ORDER — GLYCOPYRROLATE 0.2 MG/ML IJ SOLN
INTRAMUSCULAR | Status: AC
  Administered 2018-08-11: 0.1 mg via INTRAVENOUS
  Filled 2018-08-11: qty 1

## 2018-08-11 MED ORDER — METHOHEXITAL SODIUM 100 MG/10ML IV SOSY
PREFILLED_SYRINGE | INTRAVENOUS | Status: DC | PRN
  Administered 2018-08-11: 150 mg via INTRAVENOUS

## 2018-08-11 MED ORDER — SUCCINYLCHOLINE CHLORIDE 200 MG/10ML IV SOSY
PREFILLED_SYRINGE | INTRAVENOUS | Status: DC | PRN
  Administered 2018-08-11: 100 mg via INTRAVENOUS

## 2018-08-11 MED ORDER — GLYCOPYRROLATE 0.2 MG/ML IJ SOLN
0.1000 mg | Freq: Once | INTRAMUSCULAR | Status: AC
  Administered 2018-08-11: 0.1 mg via INTRAVENOUS

## 2018-08-11 MED ORDER — ESMOLOL HCL 100 MG/10ML IV SOLN
INTRAVENOUS | Status: DC | PRN
  Administered 2018-08-11: 30 mg via INTRAVENOUS

## 2018-08-11 NOTE — Anesthesia Postprocedure Evaluation (Signed)
Anesthesia Post Note  Patient: Renee Turner  Procedure(s) Performed: ECT TX  Patient location during evaluation: PACU Anesthesia Type: General Level of consciousness: awake and alert Pain management: pain level controlled Vital Signs Assessment: post-procedure vital signs reviewed and stable Respiratory status: spontaneous breathing, nonlabored ventilation and respiratory function stable Cardiovascular status: blood pressure returned to baseline and stable Postop Assessment: no signs of nausea or vomiting Anesthetic complications: no     Last Vitals:  Vitals:   08/11/18 1139 08/11/18 1153  BP: 115/80 115/84  Pulse: (!) 110 78  Resp: 17 16  Temp:  36.4 C  SpO2: 100%     Last Pain:  Vitals:   08/11/18 1153  TempSrc: Oral  PainSc: 0-No pain                 Shavana Calder

## 2018-08-11 NOTE — Anesthesia Post-op Follow-up Note (Signed)
Anesthesia QCDR form completed.        

## 2018-08-11 NOTE — Anesthesia Preprocedure Evaluation (Signed)
Anesthesia Evaluation  Patient identified by MRN, date of birth, ID band Patient awake    Reviewed: Allergy & Precautions, NPO status , Patient's Chart, lab work & pertinent test results  History of Anesthesia Complications Negative for: history of anesthetic complications  Airway Mallampati: III  TM Distance: >3 FB Neck ROM: Full    Dental no notable dental hx.    Pulmonary neg pulmonary ROS, neg sleep apnea, neg COPD,    breath sounds clear to auscultation- rhonchi (-) wheezing      Cardiovascular Exercise Tolerance: Good (-) hypertension(-) CAD, (-) Past MI, (-) Cardiac Stents and (-) CABG  Rhythm:Regular Rate:Normal - Systolic murmurs and - Diastolic murmurs    Neuro/Psych PSYCHIATRIC DISORDERS Anxiety Depression negative neurological ROS     GI/Hepatic negative GI ROS, Neg liver ROS,   Endo/Other  diabetes, Oral Hypoglycemic Agents  Renal/GU negative Renal ROS     Musculoskeletal negative musculoskeletal ROS (+)   Abdominal (+) + obese,   Peds  Hematology negative hematology ROS (+)   Anesthesia Other Findings Past Medical History: No date: Anxiety No date: Depression No date: Diabetes mellitus without complication ([name])     Comment:  Type II No date: Diabetes mellitus, type II ([name])   Reproductive/Obstetrics                             Anesthesia Physical  Anesthesia Plan  ASA: II  Anesthesia Plan: General   Post-op Pain Management:    Induction: Intravenous  PONV Risk Score and Plan: 2 and Ondansetron  Airway Management Planned: Mask  Additional Equipment:   Intra-op Plan:   Post-operative Plan:   Informed Consent: I have reviewed the patients History and Physical, chart, labs and discussed the procedure including the risks, benefits and alternatives for the proposed anesthesia with the patient or authorized representative who has indicated his/her understanding  and acceptance.   Dental advisory given  Plan Discussed with: CRNA and Anesthesiologist  Anesthesia Plan Comments:         Anesthesia Quick Evaluation  

## 2018-08-11 NOTE — Anesthesia Procedure Notes (Signed)
Date/Time: 08/11/2018 10:57 AM Performed by: Lily Kocher, CRNA Pre-anesthesia Checklist: Patient identified, Emergency Drugs available, Suction available and Patient being monitored Patient Re-evaluated:Patient Re-evaluated prior to induction Oxygen Delivery Method: Circle system utilized Preoxygenation: Pre-oxygenation with 100% oxygen Induction Type: IV induction Ventilation: Mask ventilation without difficulty and Mask ventilation throughout procedure Airway Equipment and Method: Bite block Placement Confirmation: positive ETCO2 Dental Injury: Teeth and Oropharynx as per pre-operative assessment

## 2018-08-11 NOTE — Discharge Instructions (Signed)
1)  The drugs that you have been given will stay in your system until tomorrow so for the       next 24 hours you should not:  A. Drive an automobile  B. Make any legal decisions  C. Drink any alcoholic beverages  2)  You may resume your regular meals upon return home.  3)  A responsible adult must take you home.  Someone should stay with you for a few          hours, then be available by phone for the remainder of the treatment day.  4)  You May experience any of the following symptoms:  Headache, Nausea and a dry mouth (due to the medications you were given),  temporary memory loss and some confusion, or sore muscles (a warm bath  should help this).  If you you experience any of these symptoms let us know on                your return visit.  5)  Report any of the following: any acute discomfort, severe headache, or temperature        greater than 100.5 F.   Also report any unusual redness, swelling, drainage, or pain         at your IV site.    You may report Symptoms to:  ECT PROGRAM- [place] [facility]          Phone: [phone], ECT Department           or Dr. Shary Key office [phone]  6)  Your next ECT Treatment is when you call Dr. Toni Amend regarding needing another treatment  We will call 2 days prior to your scheduled appointment for arrival times.  7)  Nothing to eat or drink after midnight the night before your procedure.  8)  Take      With a sip of water the morning of your procedure.  9)  Other Instructions: Call [phone] to cancel the morning of your procedure due         to illness or emergency.  10) We will call within 72 hours to assess how you are feeling.

## 2018-08-11 NOTE — Transfer of Care (Signed)
Immediate Anesthesia Transfer of Care Note  Patient: Renee Turner  Procedure(s) Performed: ECT TX  Patient Location: PACU  Anesthesia Type:General  Level of Consciousness: sedated  Airway & Oxygen Therapy: Patient Spontanous Breathing and Patient connected to face mask oxygen  Post-op Assessment: Report given to RN and Post -op Vital signs reviewed and stable  Post vital signs: Reviewed and stable  Last Vitals:  Vitals Value Taken Time  BP 109/63   Temp    Pulse 72 08/11/2018 11:09 AM  Resp 16 08/11/2018 11:09 AM  SpO2 100 % 08/11/2018 11:09 AM  Vitals shown include unvalidated device data.  Last Pain:  Vitals:   08/11/18 0905  TempSrc:   PainSc: 0-No pain         Complications: No apparent anesthesia complications

## 2018-09-25 ENCOUNTER — Other Ambulatory Visit: Payer: Self-pay | Admitting: Psychiatry

## 2018-09-25 ENCOUNTER — Encounter: Payer: Self-pay | Admitting: Psychiatry

## 2018-09-25 ENCOUNTER — Inpatient Hospital Stay
Admission: AD | Admit: 2018-09-25 | Discharge: 2018-09-29 | DRG: 885 | Disposition: A | Discharge status: Home / Self Care | Payer: BLUE CROSS/BLUE SHIELD | Source: Intra-hospital | Attending: Psychiatry | Admitting: Psychiatry

## 2018-09-25 ENCOUNTER — Emergency Department (EMERGENCY_DEPARTMENT_HOSPITAL)
Admission: EM | Admit: 2018-09-25 | Discharge: 2018-09-25 | Disposition: A | Discharge status: Other Acute Inpatient Hospital | Payer: BLUE CROSS/BLUE SHIELD | Source: Home / Self Care | Attending: Emergency Medicine | Admitting: Emergency Medicine

## 2018-09-25 ENCOUNTER — Other Ambulatory Visit: Payer: Self-pay

## 2018-09-25 ENCOUNTER — Encounter: Payer: Self-pay | Admitting: Emergency Medicine

## 2018-09-25 DIAGNOSIS — F1010 Alcohol abuse, uncomplicated: Secondary | ICD-10-CM | POA: Diagnosis present

## 2018-09-25 DIAGNOSIS — F332 Major depressive disorder, recurrent severe without psychotic features: Secondary | ICD-10-CM | POA: Insufficient documentation

## 2018-09-25 DIAGNOSIS — Z7984 Long term (current) use of oral hypoglycemic drugs: Secondary | ICD-10-CM | POA: Insufficient documentation

## 2018-09-25 DIAGNOSIS — G4700 Insomnia, unspecified: Secondary | ICD-10-CM | POA: Diagnosis present

## 2018-09-25 DIAGNOSIS — Z9119 Patient's noncompliance with other medical treatment and regimen: Secondary | ICD-10-CM | POA: Diagnosis not present

## 2018-09-25 DIAGNOSIS — Z915 Personal history of self-harm: Secondary | ICD-10-CM | POA: Diagnosis not present

## 2018-09-25 DIAGNOSIS — E1165 Type 2 diabetes mellitus with hyperglycemia: Secondary | ICD-10-CM | POA: Diagnosis present

## 2018-09-25 DIAGNOSIS — Z79899 Other long term (current) drug therapy: Secondary | ICD-10-CM

## 2018-09-25 DIAGNOSIS — F419 Anxiety disorder, unspecified: Secondary | ICD-10-CM | POA: Diagnosis present

## 2018-09-25 DIAGNOSIS — Z5181 Encounter for therapeutic drug level monitoring: Secondary | ICD-10-CM | POA: Diagnosis not present

## 2018-09-25 DIAGNOSIS — F4310 Post-traumatic stress disorder, unspecified: Secondary | ICD-10-CM | POA: Insufficient documentation

## 2018-09-25 DIAGNOSIS — R45851 Suicidal ideations: Secondary | ICD-10-CM | POA: Diagnosis present

## 2018-09-25 DIAGNOSIS — E119 Type 2 diabetes mellitus without complications: Secondary | ICD-10-CM | POA: Diagnosis present

## 2018-09-25 DIAGNOSIS — Z79891 Long term (current) use of opiate analgesic: Secondary | ICD-10-CM

## 2018-09-25 LAB — COMPREHENSIVE METABOLIC PANEL
ALBUMIN: 4.1 g/dL (ref 3.5–5.0)
ALT: 14 U/L (ref 0–44)
AST: 14 U/L — AB (ref 15–41)
Alkaline Phosphatase: 54 U/L (ref 38–126)
Anion gap: 8 (ref 5–15)
BUN: 12 mg/dL (ref 6–20)
CHLORIDE: 102 mmol/L (ref 98–111)
CO2: 30 mmol/L (ref 22–32)
CREATININE: 0.76 mg/dL (ref 0.44–1.00)
Calcium: 9.9 mg/dL (ref 8.9–10.3)
GFR calc Af Amer: >60 mL/min (ref >60)
GLUCOSE: 197 mg/dL — AB (ref 70–99)
POTASSIUM: 4 mmol/L (ref 3.5–5.1)
SODIUM: 140 mmol/L (ref 135–145)
Total Bilirubin: 0.4 mg/dL (ref 0.3–1.2)
Total Protein: 8.3 g/dL — ABNORMAL HIGH (ref 6.5–8.1)

## 2018-09-25 LAB — URINE DRUG SCREEN, QUALITATIVE (ARMC ONLY)
AMPHETAMINES, UR SCREEN: NOT DETECTED
BENZODIAZEPINE, UR SCRN: NOT DETECTED
Barbiturates, Ur Screen: NOT DETECTED
Cannabinoid 50 Ng, Ur ~~LOC~~: NOT DETECTED
Cocaine Metabolite,Ur ~~LOC~~: NOT DETECTED
MDMA (ECSTASY) UR SCREEN: NOT DETECTED
METHADONE SCREEN, URINE: NOT DETECTED
OPIATE, UR SCREEN: NOT DETECTED
PHENCYCLIDINE (PCP) UR S: NOT DETECTED
Tricyclic, Ur Screen: NOT DETECTED

## 2018-09-25 LAB — CBC
HEMATOCRIT: 36.8 % (ref 36.0–46.0)
HEMOGLOBIN: 11.8 g/dL — AB (ref 12.0–15.0)
MCH: 27 pg (ref 26.0–34.0)
MCHC: 32.1 g/dL (ref 30.0–36.0)
MCV: 84.2 fL (ref 80.0–100.0)
NRBC: 0 % (ref 0.0–0.2)
Platelets: 388 10*3/uL (ref 150–400)
RBC: 4.37 MIL/uL (ref 3.87–5.11)
RDW: 13.1 % (ref 11.5–15.5)
WBC: 14.7 10*3/uL — ABNORMAL HIGH (ref 4.0–10.5)

## 2018-09-25 LAB — SALICYLATE LEVEL: Salicylate Lvl: <7 mg/dL (ref 2.8–30.0)

## 2018-09-25 LAB — ETHANOL

## 2018-09-25 LAB — ACETAMINOPHEN LEVEL: Acetaminophen (Tylenol), Serum: <10 ug/mL — ABNORMAL LOW (ref 10–30)

## 2018-09-25 LAB — POCT PREGNANCY, URINE: Preg Test, Ur: NEGATIVE

## 2018-09-25 MED ORDER — TRAZODONE HCL 100 MG PO TABS
100.0000 mg | ORAL_TABLET | Freq: Every day | ORAL | Status: DC
  Administered 2018-09-26 – 2018-09-28 (×3): 100 mg via ORAL
  Filled 2018-09-25 (×3): qty 1

## 2018-09-25 MED ORDER — TRAZODONE HCL 100 MG PO TABS: 100.0000 mg | ORAL_TABLET | Freq: Every evening | ORAL | Status: DC | PRN

## 2018-09-25 MED ORDER — FLUOXETINE HCL 20 MG PO CAPS
40.0000 mg | ORAL_CAPSULE | Freq: Every day | ORAL | Status: DC
  Administered 2018-09-25: 40 mg via ORAL
  Filled 2018-09-25: qty 2

## 2018-09-25 MED ORDER — ARIPIPRAZOLE 5 MG PO TABS
5.0000 mg | ORAL_TABLET | Freq: Every day | ORAL | Status: DC
  Administered 2018-09-26: 5 mg via ORAL
  Filled 2018-09-25: qty 1

## 2018-09-25 MED ORDER — METFORMIN HCL 500 MG PO TABS
1000.0000 mg | ORAL_TABLET | Freq: Two times a day (BID) | ORAL | Status: DC
  Administered 2018-09-26 – 2018-09-28 (×5): 1000 mg via ORAL
  Filled 2018-09-25 (×6): qty 2

## 2018-09-25 MED ORDER — ALUM & MAG HYDROXIDE-SIMETH 200-200-20 MG/5ML PO SUSP: 30.0000 mL | ORAL | Status: DC | PRN

## 2018-09-25 MED ORDER — TRAZODONE HCL 100 MG PO TABS: 100.0000 mg | ORAL_TABLET | Freq: Every day | ORAL | Status: DC

## 2018-09-25 MED ORDER — MAGNESIUM HYDROXIDE 400 MG/5ML PO SUSP: 30.0000 mL | Freq: Every day | ORAL | Status: DC | PRN

## 2018-09-25 MED ORDER — METFORMIN HCL 500 MG PO TABS
1000.0000 mg | ORAL_TABLET | Freq: Two times a day (BID) | ORAL | Status: DC
  Administered 2018-09-25: 1000 mg via ORAL
  Filled 2018-09-25: qty 2

## 2018-09-25 MED ORDER — ARIPIPRAZOLE 5 MG PO TABS
5.0000 mg | ORAL_TABLET | Freq: Every day | ORAL | Status: DC
  Administered 2018-09-25: 5 mg via ORAL
  Filled 2018-09-25 (×2): qty 1

## 2018-09-25 MED ORDER — HYDROXYZINE HCL 50 MG PO TABS
50.0000 mg | ORAL_TABLET | Freq: Three times a day (TID) | ORAL | Status: DC | PRN
  Administered 2018-09-28: 50 mg via ORAL
  Filled 2018-09-25: qty 1

## 2018-09-25 MED ORDER — FLUOXETINE HCL 20 MG PO CAPS
40.0000 mg | ORAL_CAPSULE | Freq: Every day | ORAL | Status: DC
  Administered 2018-09-26 – 2018-09-29 (×4): 40 mg via ORAL
  Filled 2018-09-25 (×4): qty 2

## 2018-09-25 MED ORDER — KETOROLAC TROMETHAMINE 30 MG/ML IJ SOLN
30.0000 mg | Freq: Once | INTRAMUSCULAR | Status: AC
  Administered 2018-09-29: 30 mg via INTRAVENOUS

## 2018-09-25 MED ORDER — ACETAMINOPHEN 325 MG PO TABS: 650.0000 mg | ORAL_TABLET | Freq: Four times a day (QID) | ORAL | Status: DC | PRN

## 2018-09-25 MED ORDER — SODIUM CHLORIDE 0.9 % IV SOLN
500.0000 mL | Freq: Once | INTRAVENOUS | Status: AC
  Administered 2018-09-29: 500 mL via INTRAVENOUS

## 2018-09-25 NOTE — ED Notes (Signed)
Pt refused snack at this time.  

## 2018-09-25 NOTE — ED Notes (Signed)
ENVIRONMENTAL ASSESSMENT  Potentially harmful objects out of patient reach: Yes.  Personal belongings secured: Yes.  Patient dressed in hospital provided attire only: Yes.  Plastic bags out of patient reach: Yes.  Patient care equipment (cords, cables, call bells, lines, and drains) shortened, removed, or accounted for: Yes.  Equipment and supplies removed from bottom of stretcher: Yes.  Potentially toxic materials out of patient reach: Yes.  Sharps container removed or out of patient reach: Yes.   ED BHU PLACEMENT JUSTIFICATION  Is the patient under IVC or is there intent for IVC: No.  Is the patient medically cleared: Yes.  Is there vacancy in the ED BHU: Yes.  Is the population mix appropriate for patient: Yes.  Is the patient awaiting placement in inpatient or outpatient setting: Pending psych treatment has ECT scheduled in the AM per Dr Toni Amendlapacs. .  Has the patient had a psychiatric consult: Yes.  Survey of unit performed for contraband, proper placement and condition of furniture, tampering with fixtures in bathroom, shower, and each patient room: Yes. ; Findings: All clear  APPEARANCE/BEHAVIOR  calm, cooperative and adequate rapport can be established  NEURO ASSESSMENT  Orientation: time, place and person  Hallucinations: No.None noted (Hallucinations)  Speech: Normal  Gait: normal  RESPIRATORY ASSESSMENT  WNL  CARDIOVASCULAR ASSESSMENT  WNL  GASTROINTESTINAL ASSESSMENT  WNL  EXTREMITIES  WNL  PLAN OF CARE  Provide calm/safe environment. Vital signs assessed TID. ED BHU Assessment once each 12-hour shift. Collaborate with TTS daily or as condition indicates. Assure the ED provider has rounded once each shift. Provide and encourage hygiene. Provide redirection as needed. Assess for escalating behavior; address immediately and inform ED provider.  Assess family dynamic and appropriateness for visitation as needed: Yes. ; If necessary, describe findings:  Educate the  patient/family about BHU procedures/visitation: Yes. ; If necessary, describe findings: Pt is calm and cooperative at this time. Pt understanding and accepting of unit procedures/rules. Will continue to monitor with Q 15 min safety rounds and observation via security camera.   BEHAVIORAL HEALTH ROUNDING  Patient sleeping: No.  Patient alert and oriented: yes  Behavior appropriate: Yes. ; If no, describe:  Nutrition and fluids offered: Yes  Toileting and hygiene offered: Yes  Sitter present: not applicable, Q 15 min safety rounds and observation via security camera. Law enforcement present: Yes ODS

## 2018-09-25 NOTE — Progress Notes (Signed)
D: Received patient from [facility] Emergency Department at 10:44pm with report given by Dewayne HatchAnn, RN at 10:26pm. Patient skin assessment completed with Jon GillsAlexis, RN, skin is intact, no contraband found with all unit prohibited items locked away for discharge. Pt. Was admitted under the services of, Dr. Toni Amendlapacs.  Patient during the admissions process is pleasant and cooperative, but presents with noticeable thought blocking behaviors and brief eye contact. Pt. While interviewing with this writer reports she stopped taking her medications and has been having increasing suicidal thoughts just like received by this Clinical research associatewriter in report, but when asked if she is currently feeling suicidal she denies SI/HI/AVH, contracts for safety. Pt. Also denies pain. Pt. Reports when asked about history of suicide attempts denies any history of attempts to end her life. Pt. Reports eating and sleeping like usual, which is conflicting from report. Pt. Reports depression and anxiety at, "7/10" and "5/10" respectively. Denies need for PRN medications. Pt. Reports she does not now why she feels the way she has been feeling, but does appears to not be able to elaborate beyond this. Pt. Offered meal and something to drink, but wants only something to drink before bed.   A: Patient oriented to unit/room/call light. Pt. Given extensive admissions education, but will need reinforcement of these concepts. Patient was encourage to participate in unit activities and continue with plan of care. Q x 15 minute observation checks were to be initiated for safety.   R: Patient is receptive to treatment plan put into place with direct patient input and safety to be maintained on unit per MD orders. Pt. Educated on NPO after midnight for ECT in the morning. Pt. Blood sugars to be monitored pre-ect. Pt. Had ECG/Preg test complete just prior to admissions on the unit. ECG flagged in the patient's chart for MD to review.

## 2018-09-25 NOTE — Tx Team (Signed)
Initial Treatment Plan 09/25/2018 11:21 PM [name] XIH:[id number]    PATIENT STRESSORS: Medication change or noncompliance Other: depression, suicidal thoughts   PATIENT STRENGTHS: Ability for insight Average or above average intelligence Occupational psychologist fund of knowledge Motivation for treatment/growth Physical Health Special hobby/interest Supportive family/friends Work skills   PATIENT IDENTIFIED PROBLEMS: Suicidal thoughts  depression                   DISCHARGE CRITERIA:  Improved stabilization in mood, thinking, and/or behavior Need for constant or close observation no longer present Verbal commitment to aftercare and medication compliance  PRELIMINARY DISCHARGE PLAN: Outpatient therapy Return to previous living arrangement Return to previous work or school arrangements  PATIENT/FAMILY INVOLVEMENT: This treatment plan has been presented to and reviewed with the patient, Renee Turner.  The patient has been given the opportunity to ask questions and make suggestions.  Reyes Ivan, RN 09/25/2018, 11:21 PM

## 2018-09-25 NOTE — ED Notes (Signed)
BEHAVIORAL HEALTH ROUNDING  Patient sleeping: No.  Patient alert and oriented: yes  Behavior appropriate: Yes. ; If no, describe:  Nutrition and fluids offered: Yes  Toileting and hygiene offered: Yes  Sitter present: not applicable, Q 15 min safety rounds and observation via security camera. Law enforcement present: Yes ODS  

## 2018-09-25 NOTE — ED Provider Notes (Signed)
[facility] Emergency Department Provider Note  ____________________________________________  Time seen: Approximately 4:42 PM  I have reviewed the triage vital signs and the nursing notes.   HISTORY  Chief Complaint Psychiatric Evaluation  Level 5 Caveat: Portions of the History and Physical including HPI and review of systems are unable to be completely obtained due to patient being a poor historian    HPI Dedra Matsuo Regula is a 22 y.o. female with a past medical history of anxiety depression diabetes alcohol abuse and medication noncompliance who brought to the ED at the recommendation of her therapist who wanted her to have a psychiatric evaluation due to being withdrawn and having depressed mood.  She reports she is been out of her medicines for the past 2 to 3 weeks.  Is also had thoughts of self-harm.  The symptoms have been waxing and waning, unable to relate any aggravating or alleviating factors.   Patient denies any acute medical complaints   Past Medical History:  Diagnosis Date  . Anxiety   . Depression   . Diabetes mellitus without complication ([place])    Type II  . Diabetes mellitus, type II [facility])      Patient Active Problem List   Diagnosis Date Noted  . Alcohol abuse 09/25/2018  . Severe recurrent major depression without psychotic features ([place]) 05/14/2018  . MDD (major depressive disorder), recurrent severe, without psychosis ([place]) 05/06/2018  . 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 Posttraumatic stress disorder 11/13/2016  . Severe episode of recurrent major depressive disorder, without psychotic features ([place]) 11/07/2016  . Diabetes ([place]) 11/07/2016  . Diabetic neuropathy associated with type 2 diabetes mellitus ([place]) 07/19/2016  . Obesity (BMI 30.0-34.9) 07/19/2016  . Type 2 diabetes mellitus with hyperglycemia, without long-term current use of insulin ([place]) 07/19/2016     History reviewed. No pertinent surgical  history.   Prior to Admission medications   Medication Sig Start Date End Date Taking? Authorizing Provider  ARIPiprazole (ABILIFY) 5 MG tablet Take 1 tablet (5 mg total) by mouth every morning. 06/11/18 06/11/19  Aundra Dubin, MD  dapagliflozin propanediol (FARXIGA) 10 MG TABS tablet Take 10 mg by mouth daily.    [provider]  docusate sodium (COLACE) 100 MG capsule Take 1 capsule (100 mg total) by mouth 2 (two) times daily. 06/09/18   Clapacs, Madie Reno, MD  ferrous sulfate 325 (65 FE) MG tablet Take 1 tablet (325 mg total) by mouth daily with breakfast. 06/09/18   Clapacs, Madie Reno, MD  FLUoxetine (PROZAC) 40 MG capsule Take 1 capsule (40 mg total) by mouth every morning. 06/11/18   Eksir, Richard Miu, MD  gabapentin (NEURONTIN) 300 MG capsule Take 1 capsule (300 mg total) by mouth 3 (three) times daily. 06/09/18   Clapacs, Madie Reno, MD  metformin (FORTAMET) 1000 MG (OSM) 24 hr tablet Take 1 tablet (1,000 mg total) by mouth 2 (two) times daily with a meal. 06/09/18   Clapacs, Madie Reno, MD  traZODone (DESYREL) 50 MG tablet Take 1 tablet (50 mg total) by mouth at bedtime. 06/11/18   Eksir, Richard Miu, MD     Allergies Patient has no known allergies.   No family history on file.  Social History Social History   Tobacco Use  . Smoking status: Never Smoker  . Smokeless tobacco: Never Used  Substance Use Topics  . Alcohol use: Not Currently  . Drug use: No    Review of Systems  Constitutional:   No fever or chills.  ENT:   No sore throat. No rhinorrhea. Cardiovascular:   No chest pain or syncope. Respiratory:   No dyspnea or cough. Gastrointestinal:   Negative for abdominal pain, vomiting and diarrhea.  Musculoskeletal:   Negative for focal pain or swelling All other systems reviewed and are negative except as documented above in ROS and HPI.  ____________________________________________   PHYSICAL EXAM:  VITAL SIGNS: ED Triage Vitals  Enc Vitals Group     BP  09/25/18 1450 111/76     Pulse Rate 09/25/18 1450 78     Resp 09/25/18 1450 12     Temp 09/25/18 1450 98.7 F (37.1 C)     Temp Source 09/25/18 1450 Oral     SpO2 09/25/18 1450 99 %     Weight 09/25/18 1449 200 lb (90.7 kg)     Height 09/25/18 1449 '5\' 6"'  (1.676 m)     Head Circumference --      Peak Flow --      Pain Score 09/25/18 1457 0     Pain Loc --      Pain Edu? --      Excl. in [place]? --     Vital signs reviewed, nursing assessments reviewed.   Constitutional:   Alert and oriented. Non-toxic appearance. Eyes:   Conjunctivae are normal. EOMI.  ENT      Head:   Normocephalic and atraumatic.      Nose:   No congestion/rhinnorhea.       Mouth/Throat:   MMM, no pharyngeal erythema. No peritonsillar mass.       Neck:   No meningismus. Full ROM. Hematological/Lymphatic/Immunilogical:   No cervical lymphadenopathy. Cardiovascular:   RRR. Symmetric bilateral radial and DP pulses.  No murmurs. Cap refill less than 2 seconds. Respiratory:   Normal respiratory effort without tachypnea/retractions. Breath sounds are clear and equal bilaterally. No wheezes/rales/rhonchi. Gastrointestinal:   Soft and nontender. Non distended. There is no CVA tenderness.  No rebound, rigidity, or guarding. Musculoskeletal:   Normal range of motion in all extremities. No joint effusions.  No lower extremity tenderness.  No edema. Neurologic:   Normal speech and language.  Motor grossly intact. No acute focal neurologic deficits are appreciated.  Skin:    Skin is warm, dry and intact. No rash noted.  No petechiae, purpura, or bullae.  No wounds  ____________________________________________    LABS (pertinent positives/negatives) (all labs ordered are listed, but only abnormal results are displayed) Labs Reviewed  COMPREHENSIVE METABOLIC PANEL - Abnormal; Notable for the following components:      Result Value   Glucose, Bld 197 (*)    Total Protein 8.3 (*)    AST 14 (*)    All other components  within normal limits  ACETAMINOPHEN LEVEL - Abnormal; Notable for the following components:   Acetaminophen (Tylenol), Serum <10 (*)    All other components within normal limits  CBC - Abnormal; Notable for the following components:   WBC 14.7 (*)    Hemoglobin 11.8 (*)    All other components within normal limits  ETHANOL  SALICYLATE LEVEL  URINE DRUG SCREEN, QUALITATIVE ([facility] ONLY)  POC URINE PREG, ED  POCT PREGNANCY, URINE   ____________________________________________   EKG    ____________________________________________    RADIOLOGY  No results found.  ____________________________________________   PROCEDURES Procedures  ____________________________________________    CLINICAL IMPRESSION / ASSESSMENT AND PLAN / ED COURSE  Pertinent labs & imaging results that were available during my care of the patient were  reviewed by me and considered in my medical decision making (see chart for details).    Patient presents with symptoms of depression.  No acute medical complaints.  Vital signs are normal.  Exam is benign.  She is medically stable.  Discussed with Dr. Weber Cooks after his evaluation, he plans to hospitalize for ECT tomorrow and further management of her depression.      ____________________________________________   FINAL CLINICAL IMPRESSION(S) / ED DIAGNOSES    Final diagnoses:  Severe episode of recurrent major depressive disorder, without psychotic features [facility])     ED Discharge Orders    None      Portions of this note were generated with dragon dictation software. Dictation errors may occur despite best attempts at proofreading.    Carrie Mew, MD 09/25/18 [phone]

## 2018-09-25 NOTE — ED Triage Notes (Signed)
Pt presents to ED via POV with her dad. Pt presents to ED after her therapist recommended a psych evaluation. Per Pt's father pt went to work and her work called and he brought her for evaluation. When asked about SI/HI pt does not answer, pt does not answer when asked about the situation. Pt previously had ECT treatments, last one approx 1 month ago, has been off of her meds x 2-3 weeks, but restarted today. Per letter from therapist pt had thoughts of self harm (cutting), but did not act on them.

## 2018-09-25 NOTE — ED Notes (Signed)
BEHAVIORAL HEALTH ROUNDING Patient sleeping: Yes.   Patient alert and oriented: not applicable SLEEPING Behavior appropriate: Yes.  ; If no, describe: SLEEPING Nutrition and fluids offered: No SLEEPING Toileting and hygiene offered: NoSLEEPING Sitter present: not applicable, Q 15 min safety rounds and observation via security camera. Law enforcement present: Yes ODS 

## 2018-09-25 NOTE — BH Assessment (Signed)
Patient is to be [facility] BMU by Dr. Toni Amendlapacs.  Attending Physician will be Dr. Toni Amendlapacs.   [facility] [place].   Intake Paper Work has been signed and placed on patient chart.  ER staff is aware of the admission:  [facility] ER Secretary    Dr. Scotty CourtStafford, ER MD   Dewayne HatchAnn Patient's Nurse   [facility] Patient Access.

## 2018-09-25 NOTE — ED Notes (Signed)
Patient assigned to appropriate care area   Introduced self to pt  Patient oriented to unit/care area: Informed that, for their safety, care areas are designed for safety and visiting and phone hours explained to patient. Patient verbalizes understanding, and verbal contract for safety obtained Environment secured Patient has a bizarre affect, appropriate and cooperative. NAD noted

## 2018-09-25 NOTE — ED Notes (Signed)
1 pair black sneakers, 1 pair balck socks, 1 pair green pants, 1 scrub top with hearts, 1 black sweater, 1 pair underwear, 1 bra.

## 2018-09-25 NOTE — ED Notes (Signed)
PT VOLUNTARY PENDING PLACEMENT. 

## 2018-09-25 NOTE — Plan of Care (Signed)
Patient just recently admitted to the unit. Patient has not had sufficient time to show progressions at this time. Will continue to monitor for progressions.    Problem: Activity: Goal: Interest or engagement in leisure activities will improve Outcome: Not Progressing Goal: Imbalance in normal sleep/wake cycle will improve Outcome: Not Progressing   Problem: Coping: Goal: Coping ability will improve Outcome: Not Progressing Goal: Will verbalize feelings Outcome: Not Progressing   Problem: Health Behavior/Discharge Planning: Goal: Ability to make decisions will improve Outcome: Not Progressing Goal: Compliance with therapeutic regimen will improve Outcome: Not Progressing   Problem: Role Relationship: Goal: Will demonstrate positive changes in social behaviors and relationships Outcome: Not Progressing   Problem: Safety: Goal: Ability to disclose and discuss suicidal ideas will improve Outcome: Not Progressing Goal: Ability to identify and utilize support systems that promote safety will improve Outcome: Not Progressing   Problem: Self-Concept: Goal: Will verbalize positive feelings about self Outcome: Not Progressing Goal: Level of anxiety will decrease Outcome: Not Progressing

## 2018-09-25 NOTE — Consult Note (Signed)
[facility] Face-to-Face Psychiatry Consult   Reason for Consult: Consult for this 22 year old woman with a history of major depression comes to the emergency room because of a return of suicidal thinking Referring Physician: Joni Fears Patient Identification: Renee Turner MRN:  [id number] Principal Diagnosis: MDD (major depressive disorder), recurrent severe, without psychosis ([place]) Diagnosis:  Principal Problem:   MDD (major depressive disorder), recurrent severe, without psychosis ([place]) Active Problems:   Posttraumatic stress disorder   Type 2 diabetes mellitus with hyperglycemia, without long-term current use of insulin ([place])   Alcohol abuse   Total Time spent with patient: 1 hour  Subjective:   Renee Turner is a 22 y.o. female patient admitted with "I got depressed again".  HPI: Patient seen chart reviewed.  22 year old woman with a history of major depression comes voluntarily to the emergency room.  She went to see her therapist today at the insistence of her coworkers.  She revealed to the therapist that she was having more thoughts of suicide and self-mutilation.  Therapist insisted that she come to the hospital.  Patient admits she is been much more depressed for about the last week to 2 weeks.  Having more intrusive thoughts of hurting herself although she has not acted on it.  Sleep is poor.  Appetite decreased.  Activity level decreased concentration poor function at work poor.  Not having any psychotic symptoms no homicidal ideation.  Patient admits she is been noncompliant with her medication for about 2 or 3 weeks maybe longer.  She also has missed appointments for return ECT sessions.  Patient not even seen her therapist in the past month.  Also admits that she is been drinking a little more recently.  She says she got into a relationship recently and although it is going okay it triggers her mood.  Social history: Continues to live with her biological parents which she has  identified in the past as being a major stress.  Working as a Psychologist, counselling.  Medical history: Diabetes usually managed with oral medication.  Anemia.  Substance abuse history: Has recently started drinking more frequently which seems to be contributing to her mood problems.  Not intoxicated now no sign of withdrawal.  Past Psychiatric History: Patient has been treated for depression with multiple medications and has had hospitalizations after suicide attempts.  She was in our hospital earlier this year and at that time I initiated ECT treatment which did seem to be of benefit.  Patient has had some trouble with compliance with outpatient treatment.  Medications possibly have been of some benefit.  Also has a diagnosis of PTSD related to trauma from earlier in her life.  Risk to Self:   Risk to Others:   Prior Inpatient Therapy:   Prior Outpatient Therapy:    Past Medical History:  Past Medical History:  Diagnosis Date  . Anxiety   . Depression   . Diabetes mellitus without complication ([place])    Type II  . Diabetes mellitus, type II ([place])    History reviewed. No pertinent surgical history. Family History: No family history on file. Family Psychiatric  History: Not known Social History:  Social History   Substance and Sexual Activity  Alcohol Use Not Currently     Social History   Substance and Sexual Activity  Drug Use No    Social History   Socioeconomic History  . Marital status: Single    Spouse name: Not on file  . Number of children: Not on file  .  Years of education: Not on file  . Highest education level: Not on file  Occupational History  . Not on file  Social Needs  . Financial resource strain: Not on file  . Food insecurity:    Worry: Not on file    Inability: Not on file  . Transportation needs:    Medical: Not on file    Non-medical: Not on file  Tobacco Use  . Smoking status: Never Smoker  . Smokeless tobacco: Never Used  Substance and Sexual  Activity  . Alcohol use: Not Currently  . Drug use: No  . Sexual activity: Yes    Birth control/protection: None  Lifestyle  . Physical activity:    Days per week: Not on file    Minutes per session: Not on file  . Stress: Not on file  Relationships  . Social connections:    Talks on phone: Not on file    Gets together: Not on file    Attends religious service: Not on file    Active member of club or organization: Not on file    Attends meetings of clubs or organizations: Not on file    Relationship status: Not on file  Other Topics Concern  . Not on file  Social History Narrative  . Not on file   Additional Social History:    Allergies:  No Known Allergies  Labs:  Results for orders placed or performed during the hospital encounter of 09/25/18 (from the past 48 hour(s))  Comprehensive metabolic panel     Status: Abnormal   Collection Time: 09/25/18  2:51 PM  Result Value Ref Range   Sodium 140 135 - 145 mmol/L   Potassium 4.0 3.5 - 5.1 mmol/L   Chloride 102 98 - 111 mmol/L   CO2 30 22 - 32 mmol/L   Glucose, Bld 197 (H) 70 - 99 mg/dL   BUN 12 6 - 20 mg/dL   Creatinine, Ser 0.76 0.44 - 1.00 mg/dL   Calcium 9.9 8.9 - 10.3 mg/dL   Total Protein 8.3 (H) 6.5 - 8.1 g/dL   Albumin 4.1 3.5 - 5.0 g/dL   AST 14 (L) 15 - 41 U/L   ALT 14 0 - 44 U/L   Alkaline Phosphatase 54 38 - 126 U/L   Total Bilirubin 0.4 0.3 - 1.2 mg/dL   GFR calc non Af Amer >60 >60 mL/min   GFR calc Af Amer >60 >60 mL/min    Comment: (NOTE) The eGFR has been calculated using the CKD EPI equation. This calculation has not been validated in all clinical situations. eGFR's persistently <60 mL/min signify possible Chronic Kidney Disease.    Anion gap 8 5 - 15    Comment: Performed at [facility], [address] [place], [place] [address]  Ethanol     Status: None   Collection Time: 09/25/18  2:51 PM  Result Value Ref Range   Alcohol, Ethyl (B) <10 <10 mg/dL    Comment: (NOTE) Lowest  detectable limit for serum alcohol is 10 mg/dL. For medical purposes only. Performed at [facility], [address] [place], [place] [address]   Salicylate level     Status: None   Collection Time: 09/25/18  2:51 PM  Result Value Ref Range   Salicylate Lvl <3.7 2.8 - 30.0 mg/dL    Comment: Performed at [facility], [address] [place], [place] [address]  Acetaminophen level     Status: Abnormal   Collection Time: 09/25/18  2:51 PM  Result Value Ref Range   Acetaminophen (Tylenol), Serum <10 (L) 10 - 30 ug/mL    Comment: (NOTE) Therapeutic concentrations vary significantly. A range of 10-30 ug/mL  may be an effective concentration for many patients. However, some  are best treated at concentrations outside of this range. Acetaminophen concentrations >150 ug/mL at 4 hours after ingestion  and >50 ug/mL at 12 hours after ingestion are often associated with  toxic reactions. Performed at [facility], [address] [place], [place] [address]   cbc     Status: Abnormal   Collection Time: 09/25/18  2:51 PM  Result Value Ref Range   WBC 14.7 (H) 4.0 - 10.5 K/uL   RBC 4.37 3.87 - 5.11 MIL/uL   Hemoglobin 11.8 (L) 12.0 - 15.0 g/dL   HCT 36.8 36.0 - 46.0 %   MCV 84.2 80.0 - 100.0 fL   MCH 27.0 26.0 - 34.0 pg   MCHC 32.1 30.0 - 36.0 g/dL   RDW 13.1 11.5 - 15.5 %   Platelets 388 150 - 400 K/uL   nRBC 0.0 0.0 - 0.2 %    Comment: Performed at [facility], [address], [place], [place] [address]  Urine Drug Screen, Qualitative     Status: None   Collection Time: 09/25/18  2:51 PM  Result Value Ref Range   Tricyclic, Ur Screen NONE DETECTED NONE DETECTED   Amphetamines, Ur Screen NONE DETECTED NONE DETECTED   MDMA (Ecstasy)Ur Screen NONE DETECTED NONE DETECTED   Cocaine Metabolite,Ur [place] NONE DETECTED NONE DETECTED   Opiate, Ur Screen NONE DETECTED NONE DETECTED   Phencyclidine (PCP) Ur S NONE DETECTED NONE DETECTED   Cannabinoid 50  Ng, Ur [place] NONE DETECTED NONE DETECTED   Barbiturates, Ur Screen NONE DETECTED NONE DETECTED   Benzodiazepine, Ur Scrn NONE DETECTED NONE DETECTED   Methadone Scn, Ur NONE DETECTED NONE DETECTED    Comment: (NOTE) Tricyclics + metabolites, urine    Cutoff 1000 ng/mL Amphetamines + metabolites, urine  Cutoff 1000 ng/mL MDMA (Ecstasy), urine              Cutoff 500 ng/mL Cocaine Metabolite, urine          Cutoff 300 ng/mL Opiate + metabolites, urine        Cutoff 300 ng/mL Phencyclidine (PCP), urine         Cutoff 25 ng/mL Cannabinoid, urine                 Cutoff 50 ng/mL Barbiturates + metabolites, urine  Cutoff 200 ng/mL Benzodiazepine, urine              Cutoff 200 ng/mL Methadone, urine                   Cutoff 300 ng/mL The urine drug screen provides only a preliminary, unconfirmed analytical test result and should not be used for non-medical purposes. Clinical consideration and professional judgment should be applied to any positive drug screen result due to possible interfering substances. A more specific alternate chemical method must be used in order to obtain a confirmed analytical result. Gas chromatography / mass spectrometry (GC/MS) is the preferred confirmat ory method. Performed at [facility], [address] [place], [place] [address]   Pregnancy, urine POC     Status: None   Collection Time: 09/25/18  3:48 PM  Result Value Ref Range   Preg Test, Ur NEGATIVE NEGATIVE    Comment:  THE SENSITIVITY OF THIS METHODOLOGY IS >24 mIU/mL     Current Facility-Administered Medications  Medication Dose Route Frequency Provider Last Rate Last Dose  . ARIPiprazole (ABILIFY) tablet 5 mg  5 mg Oral Daily Ishmeal Rorie T, MD      . FLUoxetine (PROZAC) capsule 40 mg  40 mg Oral Daily Temara Lanum T, MD      . metFORMIN (GLUCOPHAGE) tablet 1,000 mg  1,000 mg Oral BID WC Porfiria Heinrich T, MD      . traZODone (DESYREL) tablet 100 mg  100 mg Oral QHS Weltha Cathy, Madie Reno, MD       Current Outpatient Medications  Medication Sig Dispense Refill  . ARIPiprazole (ABILIFY) 5 MG tablet Take 1 tablet (5 mg total) by mouth every morning. 90 tablet 0  . dapagliflozin propanediol (FARXIGA) 10 MG TABS tablet Take 10 mg by mouth daily.    Marland Kitchen docusate sodium (COLACE) 100 MG capsule Take 1 capsule (100 mg total) by mouth 2 (two) times daily. 60 capsule 1  . ferrous sulfate 325 (65 FE) MG tablet Take 1 tablet (325 mg total) by mouth daily with breakfast. 30 tablet 0  . FLUoxetine (PROZAC) 40 MG capsule Take 1 capsule (40 mg total) by mouth every morning. 90 capsule 0  . gabapentin (NEURONTIN) 300 MG capsule Take 1 capsule (300 mg total) by mouth 3 (three) times daily. 90 capsule 1  . metformin (FORTAMET) 1000 MG (OSM) 24 hr tablet Take 1 tablet (1,000 mg total) by mouth 2 (two) times daily with a meal. 60 tablet 1  . traZODone (DESYREL) 50 MG tablet Take 1 tablet (50 mg total) by mouth at bedtime. 90 tablet 0    Musculoskeletal: Strength & Muscle Tone: within normal limits Gait & Station: normal Patient leans: N/A  Psychiatric Specialty Exam: Physical Exam  Nursing note and vitals reviewed. Constitutional: She appears well-developed and well-nourished.  HENT:  Head: Normocephalic and atraumatic.  Eyes: Pupils are equal, round, and reactive to light. Conjunctivae are normal.  Neck: Normal range of motion.  Cardiovascular: Regular rhythm and normal heart sounds.  Respiratory: Effort normal. No respiratory distress.  GI: Soft.  Musculoskeletal: Normal range of motion.  Neurological: She is alert.  Skin: Skin is warm and dry.  Psychiatric: Her speech is delayed. She is slowed and withdrawn. Cognition and memory are impaired. She expresses impulsivity. She exhibits a depressed mood. She expresses suicidal ideation. She expresses suicidal plans.    Review of Systems  Constitutional: Negative.   HENT: Negative.   Eyes: Negative.   Respiratory: Negative.    Cardiovascular: Negative.   Gastrointestinal: Negative.   Musculoskeletal: Negative.   Skin: Negative.   Neurological: Negative.   Psychiatric/Behavioral: Positive for depression, substance abuse and suicidal ideas. Negative for hallucinations and memory loss. The patient has insomnia. The patient is not nervous/anxious.     Blood pressure 111/76, pulse 78, temperature 98.7 F (37.1 C), temperature source Oral, resp. rate 12, height '5\' 6"'  (1.676 m), weight 90.7 kg, last menstrual period 09/19/2018, SpO2 99 %.Body mass index is 32.28 kg/m.  General Appearance: Fairly Groomed  Eye Contact:  Minimal  Speech:  Slow  Volume:  Decreased  Mood:  Depressed  Affect:  Constricted  Thought Process:  Coherent  Orientation:  Full (Time, Place, and Person)  Thought Content:  Logical  Suicidal Thoughts:  Yes.  with intent/plan  Homicidal Thoughts:  No  Memory:  Immediate;   Fair Recent;   Fair Remote;   Fair  Judgement:  Fair  Insight:  Fair  Psychomotor Activity:  Decreased  Concentration:  Concentration: Fair  Recall:  [employer] of Knowledge:  Fair  Language:  Fair  Akathisia:  No  Handed:  Right  AIMS (if indicated):     Assets:  Desire for Improvement Housing Physical Health Resilience Social Support  ADL's:  Intact  Cognition:  Impaired,  Mild  Sleep:        Treatment Plan Summary: Daily contact with patient to assess and evaluate symptoms and progress in treatment, Medication management and Plan Patient is very depressed flat and withdrawn.  Having suicidal thoughts.  Based on her history of noncompliance I think that it is less likely that she will return for ECT tomorrow.  I think she meets criteria for inpatient hospitalization and have proposed to her that we admit her to the hospital for ECT and to resume medication with the hope that perhaps she will need only a short stay.  Patient agrees to plan.  Case reviewed with TTS and emergency room doctor.  Orders restarted.   Labs will be done.  Disposition: Recommend psychiatric Inpatient admission when medically cleared. Supportive therapy provided about ongoing stressors.  Alethia Berthold, MD 09/25/2018 4:32 PM

## 2018-09-26 ENCOUNTER — Inpatient Hospital Stay: Payer: BLUE CROSS/BLUE SHIELD | Admitting: Anesthesiology

## 2018-09-26 ENCOUNTER — Inpatient Hospital Stay: Payer: BLUE CROSS/BLUE SHIELD

## 2018-09-26 DIAGNOSIS — F332 Major depressive disorder, recurrent severe without psychotic features: Principal | ICD-10-CM

## 2018-09-26 LAB — LIPID PANEL
CHOLESTEROL: 186 mg/dL (ref 0–200)
HDL: 56 mg/dL (ref >40)
LDL CALC: 109 mg/dL — AB (ref 0–99)
TRIGLYCERIDES: 107 mg/dL (ref <150)
Total CHOL/HDL Ratio: 3.3 RATIO
VLDL: 21 mg/dL (ref 0–40)

## 2018-09-26 LAB — HEMOGLOBIN A1C
Hgb A1c MFr Bld: 8.5 % — ABNORMAL HIGH (ref 4.8–5.6)
Mean Plasma Glucose: 197.25 mg/dL

## 2018-09-26 LAB — GLUCOSE, CAPILLARY
GLUCOSE-CAPILLARY: 190 mg/dL — AB (ref 70–99)
GLUCOSE-CAPILLARY: 214 mg/dL — AB (ref 70–99)
Glucose-Capillary: 188 mg/dL — ABNORMAL HIGH (ref 70–99)

## 2018-09-26 LAB — TSH: TSH: 0.962 u[IU]/mL (ref 0.350–4.500)

## 2018-09-26 MED ORDER — INSULIN ASPART 100 UNIT/ML ~~LOC~~ SOLN
0.0000 [IU] | Freq: Three times a day (TID) | SUBCUTANEOUS | Status: DC
  Administered 2018-09-27: 8 [IU] via SUBCUTANEOUS
  Administered 2018-09-27: 5 [IU] via SUBCUTANEOUS
  Administered 2018-09-27: 3 [IU] via SUBCUTANEOUS
  Administered 2018-09-28 (×3): 8 [IU] via SUBCUTANEOUS

## 2018-09-26 MED ORDER — METHOHEXITAL SODIUM 100 MG/10ML IV SOSY
PREFILLED_SYRINGE | INTRAVENOUS | Status: DC | PRN
  Administered 2018-09-26: 150 mg via INTRAVENOUS

## 2018-09-26 MED ORDER — ARIPIPRAZOLE 10 MG PO TABS
10.0000 mg | ORAL_TABLET | Freq: Every day | ORAL | Status: DC
  Administered 2018-09-27 – 2018-09-29 (×3): 10 mg via ORAL
  Filled 2018-09-26 (×3): qty 1

## 2018-09-26 MED ORDER — SUCCINYLCHOLINE CHLORIDE 20 MG/ML IJ SOLN
INTRAMUSCULAR | Status: DC | PRN
  Administered 2018-09-26: 100 mg via INTRAVENOUS

## 2018-09-26 MED ORDER — LABETALOL HCL 5 MG/ML IV SOLN
INTRAVENOUS | Status: DC | PRN
  Administered 2018-09-26: 10 mg via INTRAVENOUS

## 2018-09-26 MED ORDER — ESMOLOL HCL 100 MG/10ML IV SOLN
INTRAVENOUS | Status: DC | PRN
  Administered 2018-09-26: 30 mg via INTRAVENOUS

## 2018-09-26 NOTE — Progress Notes (Signed)
Recreation Therapy Notes  Date: 09/26/2018  Time: 9:30 am   Location: Craft room   Behavioral response: N/A   Intervention Topic: Communication  Discussion/Intervention: Patient did not attend group.   Clinical Observations/Feedback:  Patient did not attend group.   Peytan Andringa LRT/CTRS        Deuce Paternoster 09/26/2018 11:12 AM

## 2018-09-26 NOTE — Anesthesia Preprocedure Evaluation (Addendum)
Anesthesia Evaluation  Patient identified by MRN, date of birth, ID band Patient awake    Reviewed: Allergy & Precautions, H&P , NPO status , Patient's Chart, lab work & pertinent test results  Airway Mallampati: III       Dental  (+) Chipped   Pulmonary neg pulmonary ROS,           Cardiovascular negative cardio ROS       Neuro/Psych PSYCHIATRIC DISORDERS Anxiety Depression negative neurological ROS     GI/Hepatic negative GI ROS, Neg liver ROS,   Endo/Other  diabetes  Renal/GU negative Renal ROS  negative genitourinary   Musculoskeletal   Abdominal   Peds  Hematology negative hematology ROS (+)   Anesthesia Other Findings Past Medical History: No date: Anxiety No date: Depression No date: Diabetes mellitus without complication ([name])     Comment:  Type II No date: Diabetes mellitus, type II ([name])  History reviewed. No pertinent surgical history.  BMI    Body Mass Index:  32.44 kg/m      Reproductive/Obstetrics negative OB ROS                            Anesthesia Physical Anesthesia Plan  ASA: III  Anesthesia Plan: General   Post-op Pain Management:    Induction:   PONV Risk Score and Plan:   Airway Management Planned: Mask  Additional Equipment:   Intra-op Plan:   Post-operative Plan:   Informed Consent: I have reviewed the patients History and Physical, chart, labs and discussed the procedure including the risks, benefits and alternatives for the proposed anesthesia with the patient or authorized representative who has indicated his/her understanding and acceptance.   Dental Advisory Given  Plan Discussed with: Anesthesiologist, CRNA and Surgeon  Anesthesia Plan Comments:         Anesthesia Quick Evaluation
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
## 2018-09-26 NOTE — H&P (Signed)
Renee Turner is an 22 y.o. female.   Chief Complaint: Patient continues to report depression.  She has gotten worse and been having suicidal thoughts. HPI: History of recurrent severe depression had reasonably good response to medication.  History of noncompliance in between.  Past Medical History:  Diagnosis Date  . Anxiety   . Depression   . Diabetes mellitus without complication ([place])    Type II  . Diabetes mellitus, type II ([place])     History reviewed. No pertinent surgical history.  History reviewed. No pertinent family history. Social History:  reports that she has never smoked. She has never used smokeless tobacco. She reports that she drank alcohol. She reports that she does not use drugs.  Allergies: No Known Allergies  Medications Prior to Admission  Medication Sig Dispense Refill  . ARIPiprazole (ABILIFY) 5 MG tablet Take 1 tablet (5 mg total) by mouth every morning. 90 tablet 0  . dapagliflozin propanediol (FARXIGA) 10 MG TABS tablet Take 10 mg by mouth daily.    Marland Kitchen docusate sodium (COLACE) 100 MG capsule Take 1 capsule (100 mg total) by mouth 2 (two) times daily. 60 capsule 1  . ferrous sulfate 325 (65 FE) MG tablet Take 1 tablet (325 mg total) by mouth daily with breakfast. 30 tablet 0  . FLUoxetine (PROZAC) 40 MG capsule Take 1 capsule (40 mg total) by mouth every morning. 90 capsule 0  . gabapentin (NEURONTIN) 300 MG capsule Take 1 capsule (300 mg total) by mouth 3 (three) times daily. 90 capsule 1  . metformin (FORTAMET) 1000 MG (OSM) 24 hr tablet Take 1 tablet (1,000 mg total) by mouth 2 (two) times daily with a meal. 60 tablet 1  . traZODone (DESYREL) 50 MG tablet Take 1 tablet (50 mg total) by mouth at bedtime. 90 tablet 0    Results for orders placed or performed during the hospital encounter of 09/25/18 (from the past 48 hour(s))  Glucose, capillary     Status: Abnormal   Collection Time: 09/26/18  5:13 AM  Result Value Ref Range   Glucose-Capillary 188  (H) 70 - 99 mg/dL  Lipid panel     Status: Abnormal   Collection Time: 09/26/18  6:57 AM  Result Value Ref Range   Cholesterol 186 0 - 200 mg/dL   Triglycerides 107 <150 mg/dL   HDL 56 >40 mg/dL   Total CHOL/HDL Ratio 3.3 RATIO   VLDL 21 0 - 40 mg/dL   LDL Cholesterol 109 (H) 0 - 99 mg/dL    Comment:        Total Cholesterol/HDL:CHD Risk Coronary Heart Disease Risk Table                     Men   Women  1/2 Average Risk   3.4   3.3  Average Risk       5.0   4.4  2 X Average Risk   9.6   7.1  3 X Average Risk  23.4   11.0        Use the calculated Patient Ratio above and the CHD Risk Table to determine the patient's CHD Risk.        ATP III CLASSIFICATION (LDL):  <100     mg/dL   Optimal  100-129  mg/dL   Near or Above                    Optimal  130-159  mg/dL   Borderline  160-189  mg/dL   High  >190     mg/dL   Very High Performed at [facility], [address] [place], [place] [address]   TSH     Status: None   Collection Time: 09/26/18  6:57 AM  Result Value Ref Range   TSH 0.962 0.350 - 4.500 uIU/mL    Comment: Performed by a 3rd Generation assay with a functional sensitivity of <=0.01 uIU/mL. Performed at [facility], [address] [place], [place] [address]   Glucose, capillary     Status: Abnormal   Collection Time: 09/26/18 10:02 AM  Result Value Ref Range   Glucose-Capillary 190 (H) 70 - 99 mg/dL   No results found.  Review of Systems  Constitutional: Negative.   HENT: Negative.   Eyes: Negative.   Respiratory: Negative.   Cardiovascular: Negative.   Gastrointestinal: Negative.   Musculoskeletal: Negative.   Skin: Negative.   Neurological: Negative.   Psychiatric/Behavioral: Positive for depression and suicidal ideas. Negative for hallucinations, memory loss and substance abuse. The patient is nervous/anxious and has insomnia.     Blood pressure 97/61, pulse 66, temperature 98.1 F (36.7 C), temperature source Oral, resp.  rate 16, height _0  (1.676 m), weight 41.3 kg, last menstrual period 09/19/2018, SpO2 98 %. Physical Exam  Nursing note and vitals reviewed. Constitutional: She appears well-developed and well-nourished.  HENT:  Head: Normocephalic and atraumatic.  Eyes: Pupils are equal, round, and reactive to light. Conjunctivae are normal.  Neck: Normal range of motion.  Cardiovascular: Regular rhythm and normal heart sounds.  Respiratory: Effort normal. No respiratory distress.  GI: Soft.  Musculoskeletal: Normal range of motion.  Neurological: She is alert.  Skin: Skin is warm and dry.  Psychiatric: Her affect is blunt. Her speech is delayed. She is slowed and withdrawn. Cognition and memory are normal. She expresses inappropriate judgment. She exhibits a depressed mood. She expresses suicidal ideation.     Assessment/Plan Plan to do ECT today and most likely on Monday and then reassess response while continuing medication  Alethia Berthold, MD 09/26/2018, 11:27 AM

## 2018-09-26 NOTE — Transfer of Care (Signed)
Immediate Anesthesia Transfer of Care Note  Patient: Renee Turner  Procedure(s) Performed: ECT TX  Patient Location: PACU  Anesthesia Type:General  Level of Consciousness: drowsy  Airway & Oxygen Therapy: Patient Spontanous Breathing and Patient connected to face mask oxygen  Post-op Assessment: Report given to RN  Post vital signs: stable  Last Vitals:  Vitals Value Taken Time  BP 114/82 09/26/2018 11:42 AM  Temp 36.7 C 09/26/2018 11:42 AM  Pulse 86 09/26/2018 11:44 AM  Resp 17 09/26/2018 11:44 AM  SpO2 100 % 09/26/2018 11:44 AM  Vitals shown include unvalidated device data.  Last Pain:  Vitals:   09/26/18 1142  TempSrc:   PainSc: (P) Asleep         Complications: No apparent anesthesia complications

## 2018-09-26 NOTE — Procedures (Signed)
ECT SERVICES Physician's Interval Evaluation & Treatment Note  Patient Identification: Renee Turner MRN:  [id number] Date of Evaluation:  09/26/2018 TX #: [id number]  MADRS:   MMSE:   P.E. Findings:  No change to physical exam  Psychiatric Interval Note:  Depressed and withdrawn with suicidal ideation  Subjective:  Patient is a 22 y.o. female seen for evaluation for Electroconvulsive Therapy. Feeling worse suicidal not psychotic  Treatment Summary:   '[x]'   Right Unilateral             '[]'  Bilateral   % Energy : 0.3 ms 50%   Impedance: 2550 ohms  Seizure Energy Index: No reading  Postictal Suppression Index: No reading  Seizure Concordance Index: No reading  Medications  Pre Shock: Robinul 0.1 mg labetalol 10 mg esmolol 30 Milgrim Toradol 30 mg Brevital 150 mg succinylcholine 100 mg  Post Shock:    Seizure Duration: 24 seconds by EMG 44 seconds by EEG   Comments: For reasons I will never understand the computer did not pick up on the EEG seizure here even though it was very clear-cut and we had established a baseline.  We will continue treatment with her next treatment on Monday.  Lungs:  '[x]'   Clear to auscultation               '[]'  Other:   Heart:    '[x]'   Regular rhythm             '[]'  irregular rhythm    '[x]'   Previous H&P reviewed, patient examined and there are NO CHANGES                 '[]'   Previous H&P reviewed, patient examined and there are changes noted.   Alethia Berthold, MD 11/22/201911:28 AM

## 2018-09-26 NOTE — Plan of Care (Addendum)
Patient found asleep in bed upon my arrival. Patient is isolative to bed throughout the evening. Neither visible nor social. Patient is minimally verbal. Agrees that she is depressed and tired. Processing impaired. Thought blocking noted. Denies SI/HI/AVH. Denies pain. Refused snack, appetite poor. Compliant with HS medication. CBG 214. No coverage ordered. Remains on Glucophage BID. Q 15 minute checks maintained. Will continue to monitor throughout the shift.  Patient slept 7.25 hours. Restlessly. CBG 239. No coverage ordered. Remains on Glucophage BID. Patient ate nothing during the night. Will endorse care to oncoming shift.  Problem: Health Behavior/Discharge Planning: Goal: Compliance with therapeutic regimen will improve Outcome: Progressing   Problem: Activity: Goal: Interest or engagement in leisure activities will improve Outcome: Not Progressing Goal: Imbalance in normal sleep/wake cycle will improve Outcome: Not Progressing   Problem: Coping: Goal: Coping ability will improve Outcome: Not Progressing Goal: Will verbalize feelings Outcome: Not Progressing

## 2018-09-26 NOTE — Anesthesia Post-op Follow-up Note (Signed)
Anesthesia QCDR form completed.        

## 2018-09-26 NOTE — Progress Notes (Signed)
Received Lyrick from PACU via wheelchair, she went to bed after drinking ginger ale. She got up, ate a late lunch  And took her AM medications. She was assessed per ECT protocol. She endorsed feeling anxious and depressed. She denied feeling suicidal. Her affect is flat. She has been OOB in the milieu at intervals and talking on the phone.

## 2018-09-27 LAB — GLUCOSE, CAPILLARY
GLUCOSE-CAPILLARY: 239 mg/dL — AB (ref 70–99)
Glucose-Capillary: 284 mg/dL — ABNORMAL HIGH (ref 70–99)
Glucose-Capillary: 188 mg/dL — ABNORMAL HIGH (ref 70–99)
Glucose-Capillary: 208 mg/dL — ABNORMAL HIGH (ref 70–99)

## 2018-09-27 NOTE — Plan of Care (Signed)
Patient is alert and oriented X 3, Denies SI, HI and AVH. Patient complains of depression rating 7/10, feeling hopelessness 5/10 and anxiety 3/10. Patient rates pain 0/10. Patients affect is very sad and depressed, facial expression  is sullen and flat. Patient speech is logical and coherent just very soft and slow. Patient reports of sleeping well last night. Report of sleeping 7.25 hours last night. Patient is eating meals, isolates to room most of the times laying down in bed. Patient does exhibit any self harming behaviors. Safety checks Q 15 minutes to continue. Problem: Activity: Goal: Interest or engagement in leisure activities will improve Outcome: Progressing Goal: Imbalance in normal sleep/wake cycle will improve Outcome: Progressing   Problem: Coping: Goal: Coping ability will improve Outcome: Progressing Goal: Will verbalize feelings Outcome: Progressing   Problem: Health Behavior/Discharge Planning: Goal: Ability to make decisions will improve Outcome: Progressing Goal: Compliance with therapeutic regimen will improve Outcome: Progressing   Problem: Role Relationship: Goal: Will demonstrate positive changes in social behaviors and relationships Outcome: Progressing

## 2018-09-27 NOTE — BHH Group Notes (Signed)
LCSW Group Therapy Note  09/27/2018 1:15pm  Type of Therapy and Topic:  Group Therapy:  Healthy Self Image and Positive Change  Participation Level:  Active   Description of Group:  In this group, patients will compare and contrast their current "I am...." statements to the visions they identify as desirable for their lives.  Patients discuss fears and how they can make positive changes in their cognitions that will positively impact their behaviors.  Facilitator played a motivational 3-minute speech and patients were left with the task of thinking about what "I am...." statements they can start using in their lives immediately.  Therapeutic Goals: 1. Patient will state their current self-perception as expressed in an "I Am" statement 2. Patient will contrast this with their desired vision for their live 3. Patient will identify 3 fears that negatively impact their behavior 4. Patient will discuss cognitive distortions that stem from their fears 5. Patient will verbalize statements that challenge their cognitive distortions  Summary of Patient Progress:  The patient reported that she feels "tired." The patient stated, "I am compassionate" Patient discussed her fears and how she can make positive changes in their cognitions that will positively impact her behaviors. Patient was able to discuss and process cognitive distortions that stem from her fears. Patient actively and appropriately engaged in the group. Patient was able to provide support and validation to other group members. Patient practiced active listening when interacting with the facilitator and other group members.   Therapeutic Modalities Cognitive Behavioral Therapy Motivational Interviewing  Marveline Profeta  CUEBAS-COLON, LCSW 09/27/2018 12:08 PM

## 2018-09-27 NOTE — Progress Notes (Signed)
Patient did attend social work group today.
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
## 2018-09-27 NOTE — Progress Notes (Signed)
[facility] MD Progress Note  09/27/2018 1:52 PM Renee Turner  MRN:  [id number] Subjective: Follow-up for this patient with recurrent severe depression.  Patient seen chart reviewed.  Patient had ECT yesterday without difficulty.  Continued antidepressant medicine today.  I found her up sitting in the day room.  As usual the patient's speech was so quiet it was almost impossible to understand.  Renee Turner says he thought Renee Turner was feeling better.  Denied any suicidal thoughts today.  Denied any hallucinations.  Not really articulating much of anything else not engaging in any more conversation affect very flat and blank.  Her blood sugars continue to run quite high.  I have put in sliding scale yesterday but there are still blood sugars close to 300. Principal Problem: Severe recurrent major depression without psychotic features ([place]) Diagnosis: Principal Problem:   Severe recurrent major depression without psychotic features ([place]) Active Problems:   Diabetes ([place])   Alcohol abuse  Total Time spent with patient: 30 minutes  Past Psychiatric History: Patient has a history of ECT for severe depression in the past.  Possible PTSD.  Past history of suicide attempts  Past Medical History:  Past Medical History:  Diagnosis Date  . Anxiety   . Depression   . Diabetes mellitus without complication ([place])    Type II  . Diabetes mellitus, type II ([place])    History reviewed. No pertinent surgical history. Family History: History reviewed. No pertinent family history. Family Psychiatric  History: None known Social History:  Social History   Substance and Sexual Activity  Alcohol Use Not Currently     Social History   Substance and Sexual Activity  Drug Use No    Social History   Socioeconomic History  . Marital status: Single    Spouse name: Not on file  . Number of children: Not on file  . Years of education: Not on file  . Highest education level: Not on file  Occupational History  . Not on  file  Social Needs  . Financial resource strain: Not on file  . Food insecurity:    Worry: Not on file    Inability: Not on file  . Transportation needs:    Medical: Not on file    Non-medical: Not on file  Tobacco Use  . Smoking status: Never Smoker  . Smokeless tobacco: Never Used  Substance and Sexual Activity  . Alcohol use: Not Currently  . Drug use: No  . Sexual activity: Yes    Birth control/protection: None  Lifestyle  . Physical activity:    Days per week: Not on file    Minutes per session: Not on file  . Stress: Not on file  Relationships  . Social connections:    Talks on phone: Not on file    Gets together: Not on file    Attends religious service: Not on file    Active member of club or organization: Not on file    Attends meetings of clubs or organizations: Not on file    Relationship status: Not on file  Other Topics Concern  . Not on file  Social History Narrative  . Not on file   Additional Social History:                         Sleep: Fair  Appetite:  Fair  Current Medications: Current Facility-Administered Medications  Medication Dose Route Frequency Provider Last Rate Last Dose  . 0.9 %  sodium chloride infusion  500 mL Intravenous Once Ranita Stjulien T, MD      . acetaminophen (TYLENOL) tablet 650 mg  650 mg Oral Q6H PRN Akirah Storck T, MD      . alum & mag hydroxide-simeth (MAALOX/MYLANTA) 200-200-20 MG/5ML suspension 30 mL  30 mL Oral Q4H PRN Yezenia Fredrick T, MD      . ARIPiprazole (ABILIFY) tablet 10 mg  10 mg Oral Daily Kegan Shepardson, Madie Reno, MD   10 mg at 09/27/18 0823  . FLUoxetine (PROZAC) capsule 40 mg  40 mg Oral Daily Ahsley Attwood T, MD   40 mg at 09/27/18 4627  . hydrOXYzine (ATARAX/VISTARIL) tablet 50 mg  50 mg Oral TID PRN Irisha Grandmaison T, MD      . insulin aspart (novoLOG) injection 0-15 Units  0-15 Units Subcutaneous TID WC Lanore Renderos, Madie Reno, MD   8 Units at 09/27/18 1142  . ketorolac (TORADOL) 30 MG/ML injection 30 mg  30  mg Intravenous Once Fairy Ashlock T, MD      . magnesium hydroxide (MILK OF MAGNESIA) suspension 30 mL  30 mL Oral Daily PRN Latica Hohmann T, MD      . metFORMIN (GLUCOPHAGE) tablet 1,000 mg  1,000 mg Oral BID WC Nikeya Maxim, Madie Reno, MD   1,000 mg at 09/27/18 0823  . traZODone (DESYREL) tablet 100 mg  100 mg Oral QHS Jezabel Lecker, Madie Reno, MD   100 mg at 09/26/18 2140    Lab Results:  Results for orders placed or performed during the hospital encounter of 09/25/18 (from the past 48 hour(s))  Glucose, capillary     Status: Abnormal   Collection Time: 09/26/18  5:13 AM  Result Value Ref Range   Glucose-Capillary 188 (H) 70 - 99 mg/dL  Hemoglobin A1c     Status: Abnormal   Collection Time: 09/26/18  6:57 AM  Result Value Ref Range   Hgb A1c MFr Bld 8.5 (H) 4.8 - 5.6 %    Comment: (NOTE) Pre diabetes:          5.7%-6.4% Diabetes:              >6.4% Glycemic control for   <7.0% adults with diabetes    Mean Plasma Glucose 197.25 mg/dL    Comment: Performed at [facility], [place] [address] [place], [place] [address]  Lipid panel     Status: Abnormal   Collection Time: 09/26/18  6:57 AM  Result Value Ref Range   Cholesterol 186 0 - 200 mg/dL   Triglycerides 107 <150 mg/dL   HDL 56 >40 mg/dL   Total CHOL/HDL Ratio 3.3 RATIO   VLDL 21 0 - 40 mg/dL   LDL Cholesterol 109 (H) 0 - 99 mg/dL    Comment:        Total Cholesterol/HDL:CHD Risk Coronary Heart Disease Risk Table                     Men   Women  1/2 Average Risk   3.4   3.3  Average Risk       5.0   4.4  2 X Average Risk   9.6   7.1  3 X Average Risk  23.4   11.0        Use the calculated Patient Ratio above and the CHD Risk Table to determine the patient's CHD Risk.        ATP III CLASSIFICATION (LDL):  <100     mg/dL  Optimal  100-129  mg/dL   Near or Above                    Optimal  130-159  mg/dL   Borderline  160-189  mg/dL   High  >190     mg/dL   Very High Performed at [facility], [address]  [place], [place] [address]   TSH     Status: None   Collection Time: 09/26/18  6:57 AM  Result Value Ref Range   TSH 0.962 0.350 - 4.500 uIU/mL    Comment: Performed by a 3rd Generation assay with a functional sensitivity of <=0.01 uIU/mL. Performed at [facility], [address] [place],  [address]   Glucose, capillary     Status: Abnormal   Collection Time: 09/26/18 10:02 AM  Result Value Ref Range   Glucose-Capillary 190 (H) 70 - 99 mg/dL  Glucose, capillary     Status: Abnormal   Collection Time: 09/26/18 11:05 PM  Result Value Ref Range   Glucose-Capillary 214 (H) 70 - 99 mg/dL  Glucose, capillary     Status: Abnormal   Collection Time: 09/27/18  6:22 AM  Result Value Ref Range   Glucose-Capillary 239 (H) 70 - 99 mg/dL  Glucose, capillary     Status: Abnormal   Collection Time: 09/27/18 11:28 AM  Result Value Ref Range   Glucose-Capillary 284 (H) 70 - 99 mg/dL    Blood Alcohol level:  Lab Results  Component Value Date   ETH <10 09/25/2018   ETH <5 77/93/9030    Metabolic Disorder Labs: Lab Results  Component Value Date   HGBA1C 8.5 (H) 09/26/2018   MPG 197.25 09/26/2018   MPG 139.85 05/07/2018   No results found for: PROLACTIN Lab Results  Component Value Date   CHOL 186 09/26/2018   TRIG 107 09/26/2018   HDL 56 09/26/2018   CHOLHDL 3.3 09/26/2018   VLDL 21 09/26/2018   LDLCALC 109 (H) 09/26/2018   LDLCALC 117 (H) 05/07/2018    Physical Findings: AIMS:  , ,  ,  ,    CIWA:    COWS:     Musculoskeletal: Strength & Muscle Tone: within normal limits Gait & Station: normal Patient leans: N/A  Psychiatric Specialty Exam: Physical Exam  Nursing note and vitals reviewed. Constitutional: Renee Turner appears well-developed and well-nourished.  HENT:  Head: Normocephalic and atraumatic.  Eyes: Pupils are equal, round, and reactive to light. Conjunctivae are normal.  Neck: Normal range of motion.  Cardiovascular: Regular rhythm and normal heart  sounds.  Respiratory: Effort normal. No respiratory distress.  GI: Soft.  Musculoskeletal: Normal range of motion.  Neurological: Renee Turner is alert.  Skin: Skin is warm and dry.  Psychiatric: Judgment normal. Her affect is blunt. Her speech is delayed. Renee Turner is slowed. Renee Turner expresses no suicidal ideation. Renee Turner exhibits abnormal recent memory.    Review of Systems  Constitutional: Negative.   HENT: Negative.   Eyes: Negative.   Respiratory: Negative.   Cardiovascular: Negative.   Gastrointestinal: Negative.   Musculoskeletal: Negative.   Skin: Negative.   Neurological: Negative.   Psychiatric/Behavioral: Positive for depression and memory loss. Negative for hallucinations, substance abuse and suicidal ideas. The patient is nervous/anxious and has insomnia.     Blood pressure (!) 98/57, pulse 73, temperature 98.5 F (36.9 C), resp. rate 16, height _0  (1.676 m), weight 41.3 kg, last menstrual period 09/19/2018, SpO2 100 %.Body mass index is 14.7 kg/m.  General Appearance: Fairly Groomed  Eye Contact:  Minimal  Speech:  Slow  Volume:  Decreased  Mood:  Depressed  Affect:  Constricted and Flat  Thought Process:  Coherent  Orientation:  Full (Time, Place, and Person)  Thought Content:  Logical  Suicidal Thoughts:  No  Homicidal Thoughts:  No  Memory:  Immediate;   Fair Recent;   Fair Remote;   Fair  Judgement:  Fair  Insight:  Shallow  Psychomotor Activity:  Decreased  Concentration:  Concentration: Fair  Recall:  [employer] of Knowledge:  Fair  Language:  Fair  Akathisia:  No  Handed:  Right  AIMS (if indicated):     Assets:  Housing Resilience  ADL's:  Intact  Cognition:  Impaired,  Mild  Sleep:  Number of Hours: 7.25     Treatment Plan Summary: Daily contact with patient to assess and evaluate symptoms and progress in treatment, Medication management and Plan Patient psychiatric medicines will be continued.  Renee Turner is going to get ECT Monday morning as well.  We will then  reassess whether Renee Turner needs to continue to be in the hospital over the holiday.  I have asked for a diabetes coordinator consult as her blood sugars continue to run quite high despite the writing scale insulin.  Encourage patient in group attendance.  We will continue with daily reassessment leading to discharge planning  Alethia Berthold, MD 09/27/2018, 1:52 PM

## 2018-09-28 ENCOUNTER — Other Ambulatory Visit: Payer: Self-pay | Admitting: Psychiatry

## 2018-09-28 LAB — GLUCOSE, CAPILLARY
GLUCOSE-CAPILLARY: 196 mg/dL — AB (ref 70–99)
Glucose-Capillary: 294 mg/dL — ABNORMAL HIGH (ref 70–99)
Glucose-Capillary: 255 mg/dL — ABNORMAL HIGH (ref 70–99)
Glucose-Capillary: 272 mg/dL — ABNORMAL HIGH (ref 70–99)

## 2018-09-28 NOTE — Progress Notes (Signed)
[facility] MD Progress Note  09/28/2018 3:49 PM Renee Turner  MRN:  [id number] Subjective: Follow-up for patient with recurrent severe depression.  Patient seen today reports that her moods feels depressed.  Denies suicidal thoughts.  As usual it is difficult to elicit much information from her.  Speech is very slow quiet and minimal.  She denies she is having psychotic symptoms.  Nursing just reports that she stays withdrawn most of the time.  No physical complaints.  No complaints of cognitive side effects or medication problems.  Blood sugars continue to run inappropriately high. Principal Problem: Severe recurrent major depression without psychotic features ([place]) Diagnosis: Principal Problem:   Severe recurrent major depression without psychotic features ([place]) Active Problems:   Diabetes ([place])   Alcohol abuse  Total Time spent with patient: 20 minutes  Past Psychiatric History: Patient has a history of recurrent severe depression past history of suicide attempts and now a history of at least partial response to ECT  Past Medical History:  Past Medical History:  Diagnosis Date  . Anxiety   . Depression   . Diabetes mellitus without complication ([place])    Type II  . Diabetes mellitus, type II ([place])    History reviewed. No pertinent surgical history. Family History: History reviewed. No pertinent family history. Family Psychiatric  History: None identified Social History:  Social History   Substance and Sexual Activity  Alcohol Use Not Currently     Social History   Substance and Sexual Activity  Drug Use No    Social History   Socioeconomic History  . Marital status: Single    Spouse name: Not on file  . Number of children: Not on file  . Years of education: Not on file  . Highest education level: Not on file  Occupational History  . Not on file  Social Needs  . Financial resource strain: Not on file  . Food insecurity:    Worry: Not on file    Inability: Not on  file  . Transportation needs:    Medical: Not on file    Non-medical: Not on file  Tobacco Use  . Smoking status: Never Smoker  . Smokeless tobacco: Never Used  Substance and Sexual Activity  . Alcohol use: Not Currently  . Drug use: No  . Sexual activity: Yes    Birth control/protection: None  Lifestyle  . Physical activity:    Days per week: Not on file    Minutes per session: Not on file  . Stress: Not on file  Relationships  . Social connections:    Talks on phone: Not on file    Gets together: Not on file    Attends religious service: Not on file    Active member of club or organization: Not on file    Attends meetings of clubs or organizations: Not on file    Relationship status: Not on file  Other Topics Concern  . Not on file  Social History Narrative  . Not on file   Additional Social History:                         Sleep: Fair  Appetite:  Fair  Current Medications: Current Facility-Administered Medications  Medication Dose Route Frequency Provider Last Rate Last Dose  . 0.9 %  sodium chloride infusion  500 mL Intravenous Once Neftali Abair T, MD      . acetaminophen (TYLENOL) tablet 650 mg  650 mg Oral  Q6H PRN Elzy Tomasello, Madie Reno, MD      . alum & mag hydroxide-simeth (MAALOX/MYLANTA) 200-200-20 MG/5ML suspension 30 mL  30 mL Oral Q4H PRN Woodroe Vogan T, MD      . ARIPiprazole (ABILIFY) tablet 10 mg  10 mg Oral Daily Zachery Niswander, Madie Reno, MD   10 mg at 09/28/18 0744  . FLUoxetine (PROZAC) capsule 40 mg  40 mg Oral Daily Danija Gosa T, MD   40 mg at 09/28/18 0744  . hydrOXYzine (ATARAX/VISTARIL) tablet 50 mg  50 mg Oral TID PRN Demitri Kucinski T, MD      . insulin aspart (novoLOG) injection 0-15 Units  0-15 Units Subcutaneous TID WC Shealyn Sean, Madie Reno, MD   8 Units at 09/28/18 1224  . ketorolac (TORADOL) 30 MG/ML injection 30 mg  30 mg Intravenous Once Shivonne Schwartzman T, MD      . magnesium hydroxide (MILK OF MAGNESIA) suspension 30 mL  30 mL Oral Daily PRN  Dontrez Pettis T, MD      . metFORMIN (GLUCOPHAGE) tablet 1,000 mg  1,000 mg Oral BID WC Inika Bellanger, Madie Reno, MD   1,000 mg at 09/28/18 0744  . traZODone (DESYREL) tablet 100 mg  100 mg Oral QHS Ona Rathert, Madie Reno, MD   100 mg at 09/27/18 2107    Lab Results:  Results for orders placed or performed during the hospital encounter of 09/25/18 (from the past 48 hour(s))  Glucose, capillary     Status: Abnormal   Collection Time: 09/26/18 11:05 PM  Result Value Ref Range   Glucose-Capillary 214 (H) 70 - 99 mg/dL  Glucose, capillary     Status: Abnormal   Collection Time: 09/27/18  6:22 AM  Result Value Ref Range   Glucose-Capillary 239 (H) 70 - 99 mg/dL  Glucose, capillary     Status: Abnormal   Collection Time: 09/27/18 11:28 AM  Result Value Ref Range   Glucose-Capillary 284 (H) 70 - 99 mg/dL  Glucose, capillary     Status: Abnormal   Collection Time: 09/27/18  4:19 PM  Result Value Ref Range   Glucose-Capillary 188 (H) 70 - 99 mg/dL  Glucose, capillary     Status: Abnormal   Collection Time: 09/27/18  9:06 PM  Result Value Ref Range   Glucose-Capillary 208 (H) 70 - 99 mg/dL   Comment 1 Notify RN   Glucose, capillary     Status: Abnormal   Collection Time: 09/28/18  7:04 AM  Result Value Ref Range   Glucose-Capillary 294 (H) 70 - 99 mg/dL   Comment 1 Notify RN   Glucose, capillary     Status: Abnormal   Collection Time: 09/28/18 11:43 AM  Result Value Ref Range   Glucose-Capillary 255 (H) 70 - 99 mg/dL   Comment 1 Notify RN     Blood Alcohol level:  Lab Results  Component Value Date   ETH <10 09/25/2018   ETH <5 25/85/2778    Metabolic Disorder Labs: Lab Results  Component Value Date   HGBA1C 8.5 (H) 09/26/2018   MPG 197.25 09/26/2018   MPG 139.85 05/07/2018   No results found for: PROLACTIN Lab Results  Component Value Date   CHOL 186 09/26/2018   TRIG 107 09/26/2018   HDL 56 09/26/2018   CHOLHDL 3.3 09/26/2018   VLDL 21 09/26/2018   LDLCALC 109 (H) 09/26/2018    LDLCALC 117 (H) 05/07/2018    Physical Findings: AIMS:  , ,  ,  ,    CIWA:  COWS:     Musculoskeletal: Strength & Muscle Tone: within normal limits Gait & Station: normal Patient leans: N/A  Psychiatric Specialty Exam: Physical Exam  Nursing note and vitals reviewed. Constitutional: She appears well-developed and well-nourished.  HENT:  Head: Normocephalic and atraumatic.  Eyes: Pupils are equal, round, and reactive to light. Conjunctivae are normal.  Neck: Normal range of motion.  Cardiovascular: Regular rhythm and normal heart sounds.  Respiratory: Effort normal. No respiratory distress.  GI: Soft.  Musculoskeletal: Normal range of motion.  Neurological: She is alert.  Skin: Skin is warm and dry.  Psychiatric: Judgment normal. Her affect is blunt. Her speech is delayed. She is slowed and withdrawn. Cognition and memory are impaired. She exhibits a depressed mood. She expresses no suicidal ideation.    Review of Systems  Constitutional: Negative.   HENT: Negative.   Eyes: Negative.   Respiratory: Negative.   Cardiovascular: Negative.   Gastrointestinal: Negative.   Musculoskeletal: Negative.   Skin: Negative.   Neurological: Negative.   Psychiatric/Behavioral: Positive for depression. Negative for hallucinations, memory loss, substance abuse and suicidal ideas. The patient is nervous/anxious. The patient does not have insomnia.     Blood pressure (!) 97/58, pulse 76, temperature 98.3 F (36.8 C), resp. rate 18, height '5\' 6"'  (1.676 m), weight 41.3 kg, last menstrual period 09/19/2018, SpO2 100 %.Body mass index is 14.7 kg/m.  General Appearance: Casual  Eye Contact:  Good  Speech:  Clear and Coherent  Volume:  Decreased  Mood:  Depressed  Affect:  Flat  Thought Process:  Coherent  Orientation:  Full (Time, Place, and Person)  Thought Content:  Logical  Suicidal Thoughts:  No  Homicidal Thoughts:  No  Memory:  Immediate;   Fair Recent;   Fair Remote;   Fair   Judgement:  Fair  Insight:  Shallow  Psychomotor Activity:  Decreased  Concentration:  Concentration: Fair  Recall:  [employer] of Knowledge:  Fair  Language:  Fair  Akathisia:  No  Handed:  Right  AIMS (if indicated):     Assets:  Desire for Improvement Housing Physical Health  ADL's:  Intact  Cognition:  WNL  Sleep:  Number of Hours: 7.45     Treatment Plan Summary: Daily contact with patient to assess and evaluate symptoms and progress in treatment, Medication management and Plan I suggested to the patient that she should think today about how safe she will feel going home.  We will have ECT tomorrow morning and if she is feeling safe we can discharge her at that point as there will not be ECT the rest of the week.  However if she does not feel comfortable with this we can talk about it and possibly have her stay in the hospital through the week.  I wanted the patient to give it some serious consideration so she could explain it to me tomorrow morning.  No change in medicine for today.  ECT scheduled for tomorrow.  Alethia Berthold, MD 09/28/2018, 3:49 PM

## 2018-09-28 NOTE — Progress Notes (Signed)
D: Patient stated slept good last night .Stated appetite is good and energy level  sluggish . Stated concentration poor . Stated she is Depressed Denies suicidal  homicidal ideations  .  No auditory hallucinations  No pain concerns . Appropriate ADL'S stated she  Took a wash up .Limited  Interacting with peers and staff. Patient isolating  To his room . Sleeping through out shift . Unable to attend unit programing . 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 Isolate to room  A: Encourage patient participation with unit programming . Instruction  Given on  Medication , verbalize understanding.  R: Voice no other concerns. Staff continue to monitor.

## 2018-09-28 NOTE — Progress Notes (Signed)
Inpatient Diabetes Program Recommendations  AACE/ADA: New Consensus Statement on Inpatient Glycemic Control (2015)  Target Ranges:  Prepandial:   less than 140 mg/dL      Peak postprandial:   less than 180 mg/dL (1-2 hours)      Critically ill patients:  140 - 180 mg/dL   Lab Results  Component Value Date   GLUCAP 255 (H) 09/28/2018   HGBA1C 8.5 (H) 09/26/2018  Results for Witucki, CARLEAN CROWL (MRN [id number]) as of 09/28/2018 11:50  Ref. Range 09/27/2018 11:28 09/27/2018 16:19 09/27/2018 21:06 09/28/2018 07:04 09/28/2018 11:43  Glucose-Capillary Latest Ref Range: 70 - 99 mg/dL 284 (H) 188 (H) 208 (H) 294 (H) 255 (H)    Review of Glycemic Control  Diabetes history: type 2 Outpatient Diabetes medications: Farxiga 10 mg tab daily, Metformin Current orders for Inpatient glycemic control: Novolog 0-15 units TID, Metformin  Inpatient Diabetes Program Recommendations:     Noted that blood sugars continue to be greater than 180 mg/dl.  Agree with current insulin orders. Recommend adding Novolog 3 units TID with meals if eating at least 50% of meal. Will continue to monitor blood sugars while in the hospital.  Harvel Ricks RN BSN CDE Diabetes Coordinator Pager: [phone]  8am-5pm

## 2018-09-28 NOTE — Plan of Care (Signed)
Patient at this time remain in room . Sleeping through out shift . Unable to attend unit programing . 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 Isolate to room   Problem: Activity: Goal: Imbalance in normal sleep/wake cycle will improve Outcome: Progressing   Problem: Coping: Goal: Coping ability will improve Outcome: Progressing   Problem: Health Behavior/Discharge Planning: Goal: Compliance with therapeutic regimen will improve Outcome: Progressing   Problem: Role Relationship: Goal: Will demonstrate positive changes in social behaviors and relationships Outcome: Progressing

## 2018-09-28 NOTE — [facility] Group Notes (Signed)
LCSW Group Therapy Note 09/28/2018 1:15pm  Type of Therapy and Topic: Group Therapy: Feelings Around Returning Home & Establishing a Supportive Framework and Supporting Oneself When Supports Not Available  Participation Level: Did Not Attend  Description of Group:  Patients first processed thoughts and feelings about upcoming discharge. These included fears of upcoming changes, lack of change, new living environments, judgements and expectations from others and overall stigma of mental health issues. The group then discussed the definition of a supportive framework, what that looks and feels like, and how do to discern it from an unhealthy non-supportive network. The group identified different types of supports as well as what to do when your family/friends are less than helpful or unavailable  Therapeutic Goals  1. Patient will identify one healthy supportive network that they can use at discharge. 2. Patient will identify one factor of a supportive framework and how to tell it from an unhealthy network. 3. Patient able to identify one coping skill to use when they do not have positive supports from others. 4. Patient will demonstrate ability to communicate their needs through discussion and/or role plays.  Summary of Patient Progress:  Pt was invited to attend group but chose not to attend. CSW will continue to encourage pt to attend group throughout their admission.   Therapeutic Modalities Cognitive Behavioral Therapy Motivational Interviewing   Renee Turner  CUEBAS-COLON, LCSW 09/28/2018 12:08 PM

## 2018-09-28 NOTE — Plan of Care (Signed)
Pt. Is complaint with medications. Pt. Continues to be isolative and withdrawn to her room. Pt. Denies si/hi, verbally is able to contract for safety. Pt. Is absent for groups, snacks, and unit activities this evening.     Problem: Health Behavior/Discharge Planning: Goal: Compliance with therapeutic regimen will improve Outcome: Progressing   Problem: Safety: Goal: Ability to disclose and discuss suicidal ideas will improve Outcome: Progressing   Problem: Safety: Goal: Ability to remain free from injury will improve Outcome: Progressing   Problem: Activity: Goal: Interest or engagement in leisure activities will improve Outcome: Not Progressing

## 2018-09-28 NOTE — Progress Notes (Signed)
D: Upon arrival to the unit, the patient Is isolative and withdrawn to her room resting. Pt. During this writer's assessment is pleasant and does engage, but minimally. Pt. Forwards little. Pt. Reports anxiety and depression as, "2/10" and "5/10" respectively. Pt. Spent a majority of the shift isolative and withdrawn to her room. Speech continues to be delayed, affect still flat. Pt. Denies si/hi/avh, contracts for safety. Physical movements still slowed, but gait is steady.   A: Q x 15 minute observation checks were completed for safety. Patient was provided with education, but needs reinforcement.  Patient was given/offered medications per orders. Patient  was encourage to attend groups, participate in unit activities and continue with plan of care. Pt. Chart and plans of care reviewed. Pt. Given support and encouragement.   R: Patient is complaint with medication with direction and encouragement. Pt. Blood sugars monitored per MD orders. Snack, group, and unit activities attendance absent.             Precautionary checks every 15 minutes for safety maintained, room free of safety hazards, patient sustains no injury or falls during this shift. Will endorse care to next shift.

## 2018-09-29 ENCOUNTER — Inpatient Hospital Stay: Payer: BLUE CROSS/BLUE SHIELD | Admitting: Anesthesiology

## 2018-09-29 ENCOUNTER — Encounter: Payer: Self-pay | Admitting: *Deleted

## 2018-09-29 LAB — GLUCOSE, CAPILLARY
Glucose-Capillary: 217 mg/dL — ABNORMAL HIGH (ref 70–99)
Glucose-Capillary: 181 mg/dL — ABNORMAL HIGH (ref 70–99)

## 2018-09-29 MED ORDER — ESMOLOL HCL 100 MG/10ML IV SOLN
INTRAVENOUS | Status: DC | PRN
  Administered 2018-09-29: 30 mg via INTRAVENOUS

## 2018-09-29 MED ORDER — GLYCOPYRROLATE 0.2 MG/ML IJ SOLN
0.1000 mg | Freq: Once | INTRAMUSCULAR | Status: AC
  Administered 2018-09-29: 0.1 mg via INTRAVENOUS

## 2018-09-29 MED ORDER — FENTANYL CITRATE (PF) 100 MCG/2ML IJ SOLN: 25.0000 ug | INTRAMUSCULAR | Status: DC | PRN

## 2018-09-29 MED ORDER — KETOROLAC TROMETHAMINE 30 MG/ML IJ SOLN: 30.0000 mg | Freq: Once | INTRAMUSCULAR | Status: DC

## 2018-09-29 MED ORDER — ARIPIPRAZOLE 10 MG PO TABS: 10.0000 mg | ORAL_TABLET | Freq: Every day | ORAL | 1 refills | Status: AC

## 2018-09-29 MED ORDER — METFORMIN HCL 1000 MG PO TABS: 1000.0000 mg | ORAL_TABLET | Freq: Two times a day (BID) | ORAL | 1 refills | Status: AC

## 2018-09-29 MED ORDER — ONDANSETRON HCL 4 MG/2ML IJ SOLN: 4.0000 mg | Freq: Once | INTRAMUSCULAR | Status: DC | PRN

## 2018-09-29 MED ORDER — TRAZODONE HCL 100 MG PO TABS: 100.0000 mg | ORAL_TABLET | Freq: Every day | ORAL | 1 refills | Status: AC

## 2018-09-29 MED ORDER — METHOHEXITAL SODIUM 100 MG/10ML IV SOSY
PREFILLED_SYRINGE | INTRAVENOUS | Status: DC | PRN
  Administered 2018-09-29: 150 mg via INTRAVENOUS

## 2018-09-29 MED ORDER — SODIUM CHLORIDE 0.9 % IV SOLN
500.0000 mL | Freq: Once | INTRAVENOUS | Status: AC
  Administered 2018-09-29: 09:00:00 via INTRAVENOUS

## 2018-09-29 MED ORDER — LABETALOL HCL 5 MG/ML IV SOLN
INTRAVENOUS | Status: DC | PRN
  Administered 2018-09-29: 20 mg via INTRAVENOUS

## 2018-09-29 MED ORDER — FLUOXETINE HCL 40 MG PO CAPS: 40.0000 mg | ORAL_CAPSULE | Freq: Every day | ORAL | 1 refills | Status: AC

## 2018-09-29 NOTE — Progress Notes (Signed)
Recreation Therapy Notes          Renee Turner 09/29/2018 11:37 AM

## 2018-09-29 NOTE — Progress Notes (Signed)
Patient alert and oriented x 4, affect is flat and sad, isolates to self,  minimal interaction with peers and staff. Patient denies SI/HI/AVH, rated depression a 6/10 ( 0 low -10 high) complaint with medication regimen, speech is soft non pressured, thoughts are organized and coherent, patient is scheduled for ECT in the morning on NPO status after midnight. 15 minutes safety checks maintained will continue to closely monitor.

## 2018-09-29 NOTE — H&P (Signed)
Renee Turner is an 22 y.o. female.   Chief Complaint: Patient is still depressed but denies any suicidal ideation denies any psychotic symptoms. HPI: History of recurrent depression somewhat complicated by noncompliance  Past Medical History:  Diagnosis Date  . Anxiety   . Depression   . Diabetes mellitus without complication ([place])    Type II  . Diabetes mellitus, type II ([place])     History reviewed. No pertinent surgical history.  History reviewed. No pertinent family history. Social History:  reports that she has never smoked. She has never used smokeless tobacco. She reports that she drank alcohol. She reports that she does not use drugs.  Allergies: No Known Allergies  Medications Prior to Admission  Medication Sig Dispense Refill  . ARIPiprazole (ABILIFY) 5 MG tablet Take 1 tablet (5 mg total) by mouth every morning. 90 tablet 0  . dapagliflozin propanediol (FARXIGA) 10 MG TABS tablet Take 10 mg by mouth daily.    Marland Kitchen docusate sodium (COLACE) 100 MG capsule Take 1 capsule (100 mg total) by mouth 2 (two) times daily. 60 capsule 1  . ferrous sulfate 325 (65 FE) MG tablet Take 1 tablet (325 mg total) by mouth daily with breakfast. 30 tablet 0  . FLUoxetine (PROZAC) 40 MG capsule Take 1 capsule (40 mg total) by mouth every morning. 90 capsule 0  . gabapentin (NEURONTIN) 300 MG capsule Take 1 capsule (300 mg total) by mouth 3 (three) times daily. 90 capsule 1  . metformin (FORTAMET) 1000 MG (OSM) 24 hr tablet Take 1 tablet (1,000 mg total) by mouth 2 (two) times daily with a meal. 60 tablet 1  . traZODone (DESYREL) 50 MG tablet Take 1 tablet (50 mg total) by mouth at bedtime. 90 tablet 0    Results for orders placed or performed during the hospital encounter of 09/25/18 (from the past 48 hour(s))  Glucose, capillary     Status: Abnormal   Collection Time: 09/27/18 11:28 AM  Result Value Ref Range   Glucose-Capillary 284 (H) 70 - 99 mg/dL  Glucose, capillary     Status:  Abnormal   Collection Time: 09/27/18  4:19 PM  Result Value Ref Range   Glucose-Capillary 188 (H) 70 - 99 mg/dL  Glucose, capillary     Status: Abnormal   Collection Time: 09/27/18  9:06 PM  Result Value Ref Range   Glucose-Capillary 208 (H) 70 - 99 mg/dL   Comment 1 Notify RN   Glucose, capillary     Status: Abnormal   Collection Time: 09/28/18  7:04 AM  Result Value Ref Range   Glucose-Capillary 294 (H) 70 - 99 mg/dL   Comment 1 Notify RN   Glucose, capillary     Status: Abnormal   Collection Time: 09/28/18 11:43 AM  Result Value Ref Range   Glucose-Capillary 255 (H) 70 - 99 mg/dL   Comment 1 Notify RN   Glucose, capillary     Status: Abnormal   Collection Time: 09/28/18  4:18 PM  Result Value Ref Range   Glucose-Capillary 272 (H) 70 - 99 mg/dL   Comment 1 Notify RN   Glucose, capillary     Status: Abnormal   Collection Time: 09/28/18  8:35 PM  Result Value Ref Range   Glucose-Capillary 196 (H) 70 - 99 mg/dL   Comment 1 Notify RN   Glucose, capillary     Status: Abnormal   Collection Time: 09/29/18  6:50 AM  Result Value Ref Range   Glucose-Capillary 217 (H)  70 - 99 mg/dL   Comment 1 Notify RN    No results found.  Review of Systems  Constitutional: Negative.   HENT: Negative.   Eyes: Negative.   Respiratory: Negative.   Cardiovascular: Negative.   Gastrointestinal: Negative.   Musculoskeletal: Negative.   Skin: Negative.   Neurological: Negative.   Psychiatric/Behavioral: Negative for depression, hallucinations, substance abuse and suicidal ideas. The patient is not nervous/anxious and does not have insomnia.     Blood pressure 109/76, pulse 88, temperature 98.4 F (36.9 C), temperature source Oral, resp. rate (!) 8, height _0  (1.676 m), weight 92.5 kg, last menstrual period 09/19/2018, SpO2 100 %. Physical Exam  Nursing note and vitals reviewed. Constitutional: She appears well-developed and well-nourished.  HENT:  Head: Normocephalic and atraumatic.   Eyes: Pupils are equal, round, and reactive to light. Conjunctivae are normal.  Neck: Normal range of motion.  Cardiovascular: Regular rhythm and normal heart sounds.  Respiratory: Effort normal.  GI: Soft.  Musculoskeletal: Normal range of motion.  Neurological: She is alert.  Skin: Skin is warm and dry.  Psychiatric: Judgment normal. Her affect is blunt. Her speech is delayed. She is slowed. Cognition and memory are normal. She expresses no suicidal ideation.     Assessment/Plan We are going to discharge her today with the absolute agreement that she must come back Monday for ECT and continue medicine  Alethia Berthold, MD 09/29/2018, 10:59 AM

## 2018-09-29 NOTE — Progress Notes (Signed)
Received Renee Turner this AM in her room asleep, she was taken to the PACU for ECT. She returned from ECT and went to bed without lunch with a reported BS check from the PACU nurse 181. Later Dr. Toni Amendlapacs  Arrived and she received her discharge order and will follow up as an outpatient next week.  She rated feeling depressed 4/10 and anxiety/hopelessness 2/10 on her self Inventory form. Her mom arrived to take her home at 1615 hrs without incident.

## 2018-09-29 NOTE — Transfer of Care (Signed)
Immediate Anesthesia Transfer of Care Note  Patient: Renee Turner  Procedure(s) Performed: ECT TX  Patient Location: PACU  Anesthesia Type:General  Level of Consciousness: sedated  Airway & Oxygen Therapy: Patient Spontanous Breathing and Patient connected to face mask oxygen  Post-op Assessment: Report given to RN and Post -op Vital signs reviewed and stable  Post vital signs: Reviewed and stable  Last Vitals:  Vitals Value Taken Time  BP    Temp    Pulse 81 09/29/2018 11:09 AM  Resp 22 09/29/2018 11:09 AM  SpO2 93 % 09/29/2018 11:09 AM  Vitals shown include unvalidated device data.  Last Pain:  Vitals:   09/29/18 0912  TempSrc:   PainSc: 0-No pain         Complications: No apparent anesthesia complications

## 2018-09-29 NOTE — Discharge Instructions (Signed)
1)  The drugs that you have been given will stay in your system until tomorrow so for the       next 24 hours you should not:  A. Drive an automobile  B. Make any legal decisions  C. Drink any alcoholic beverages  2)  You may resume your regular meals upon return home.  3)  A responsible adult must take you home.  Someone should stay with you for a few          hours, then be available by phone for the remainder of the treatment day.  4)  You May experience any of the following symptoms:  Headache, Nausea and a dry mouth (due to the medications you were given),  temporary memory loss and some confusion, or sore muscles (a warm bath  should help this).  If you you experience any of these symptoms let us know on                your return visit.  5)  Report any of the following: any acute discomfort, severe headache, or temperature        greater than 100.5 F.   Also report any unusual redness, swelling, drainage, or pain         at your IV site.    You may report Symptoms to:  ECT PROGRAM- [place] [facility]          Phone: [phone], ECT Department           or Dr. Shary Keylapac's office [phone]  6)  Your next ECT Treatment is Monday December 2, at 8:15  We will call 2 days prior to your scheduled appointment for arrival times.  7)  Nothing to eat or drink after midnight the night before your procedure.  8)  Take     With a sip of water the morning of your procedure.  9)  Other Instructions: Call [phone] to cancel the morning of your procedure due         to illness or emergency.  10) We will call within 72 hours to assess how you are feeling.

## 2018-09-29 NOTE — [facility] Group Notes (Signed)
LCSW Group Therapy Note   09/29/2018 1:15pm   Type of Therapy and Topic:  Group Therapy:  Overcoming Obstacles   Participation Level:  Did Not Attend   Description of Group:    In this group patients will be encouraged to explore what they see as obstacles to their own wellness and recovery. They will be guided to discuss their thoughts, feelings, and behaviors related to these obstacles. The group will process together ways to cope with barriers, with attention given to specific choices patients can make. Each patient will be challenged to identify changes they are motivated to make in order to overcome their obstacles. This group will be process-oriented, with patients participating in exploration of their own experiences as well as giving and receiving support and challenge from other group members.   Therapeutic Goals: 1. Patient will identify personal and current obstacles as they relate to admission. 2. Patient will identify barriers that currently interfere with their wellness or overcoming obstacles.  3. Patient will identify feelings, thought process and behaviors related to these barriers. 4. Patient will identify two changes they are willing to make to overcome these obstacles:      Summary of Patient Progress      Therapeutic Modalities:   Cognitive Behavioral Therapy Solution Focused Therapy Motivational Interviewing Relapse Prevention Therapy  Bonnye Halle B Toy Eisemann, LCSW 09/29/2018 4:10 PM  

## 2018-09-29 NOTE — Anesthesia Postprocedure Evaluation (Signed)
Anesthesia Post Note  Patient: Renee Turner  Procedure(s) Performed: ECT TX  Patient location during evaluation: PACU Anesthesia Type: General Level of consciousness: awake and alert Pain management: pain level controlled Vital Signs Assessment: post-procedure vital signs reviewed and stable Respiratory status: spontaneous breathing, nonlabored ventilation and respiratory function stable Cardiovascular status: blood pressure returned to baseline and stable Postop Assessment: no apparent nausea or vomiting Anesthetic complications: no     Last Vitals:  Vitals:   09/29/18 0628 09/29/18 0629  BP: 101/61 (!) 91/58  Pulse: 80 90  Resp: 18   Temp: 36.9 C   SpO2: 100%     Last Pain:  Vitals:   09/29/18 0628  TempSrc: Oral  PainSc:                  Durenda Hurt

## 2018-09-29 NOTE — Discharge Summary (Signed)
Physician Discharge Summary Note  Patient:  Renee Turner is an 22 y.o., female MRN:  [id number] DOB:  08/22/1996 Patient phone:  [phone] (home)  Patient address:   [place] [address],  Total Time spent with patient: 45 minutes  Date of Admission:  09/25/2018 Date of Discharge: September 29, 2018  Reason for Admission: Patient was admitted through the emergency room where she presented with a worsening of her depression accompanied by some suicidal ideation.  Principal Problem: Severe recurrent major depression without psychotic features [facility]) Discharge Diagnoses: Principal Problem:   Severe recurrent major depression without psychotic features ([place]) Active Problems:   Diabetes ([place])   Alcohol abuse   Past Psychiatric History: Patient has a history of recurrent episodes of severe depression and has a past history of suicide attempts.  She also has a past history of noncompliance with recommended treatment.  She has responded reasonably well to ECT in the past but has been less than fully compliant with recommended outpatient treatment  Past Medical History:  Past Medical History:  Diagnosis Date  . Anxiety   . Depression   . Diabetes mellitus without complication ([place])    Type II  . Diabetes mellitus, type II ([place])    History reviewed. No pertinent surgical history. Family History: History reviewed. No pertinent family history. Family Psychiatric  History: None known Social History:  Social History   Substance and Sexual Activity  Alcohol Use Not Currently     Social History   Substance and Sexual Activity  Drug Use No    Social History   Socioeconomic History  . Marital status: Single    Spouse name: Not on file  . Number of children: Not on file  . Years of education: Not on file  . Highest education level: Not on file  Occupational History  . Not on file  Social Needs  . Financial resource strain: Not on file  . Food  insecurity:    Worry: Not on file    Inability: Not on file  . Transportation needs:    Medical: Not on file    Non-medical: Not on file  Tobacco Use  . Smoking status: Never Smoker  . Smokeless tobacco: Never Used  Substance and Sexual Activity  . Alcohol use: Not Currently  . Drug use: No  . Sexual activity: Yes    Birth control/protection: None  Lifestyle  . Physical activity:    Days per week: Not on file    Minutes per session: Not on file  . Stress: Not on file  Relationships  . Social connections:    Talks on phone: Not on file    Gets together: Not on file    Attends religious service: Not on file    Active member of club or organization: Not on file    Attends meetings of clubs or organizations: Not on file    Relationship status: Not on file  Other Topics Concern  . Not on file  Social History Narrative  . Not on file    Hospital Course: Patient was admitted to the psychiatric ward with a plan that we restart ECT and then reevaluate daily.  During her time in the hospital she did not engage in any dangerous behavior.  She did not report any active suicidal ideation.  She did not appear to be psychotic.  She was taking care of her hygiene and activities of daily living.  Patient restarted antidepressant and antipsychotic medication as  previously prescribed.  She also had ECT and has had to right unilateral treatments during her time in the hospital with good tolerance.  On reassessment the patient denies any current suicidal ideation.  Does not appear to be psychotic.  Appears to be able to take care of her self outside the hospital.  She is showing improved insight and is agreeable to coming back for follow-up ECT treatment next Monday.  Given that she appears to be stable and that there will be no ECT this Wednesday or Friday it seems like a reasonable plan to discharge her with a return for outpatient ECT on Monday.  Patient was instructed to please continue all of her  psychiatric medicine and agrees to this.  Physical Findings: AIMS:  , ,  ,  ,    CIWA:    COWS:     Musculoskeletal: Strength & Muscle Tone: within normal limits Gait & Station: normal Patient leans: N/A  Psychiatric Specialty Exam: Physical Exam  Nursing note and vitals reviewed. Constitutional: She appears well-developed and well-nourished.  HENT:  Head: Normocephalic and atraumatic.  Eyes: Pupils are equal, round, and reactive to light. Conjunctivae are normal.  Neck: Normal range of motion.  Cardiovascular: Regular rhythm and normal heart sounds.  Respiratory: Effort normal. No respiratory distress.  GI: Soft.  Musculoskeletal: Normal range of motion.  Neurological: She is alert.  Skin: Skin is warm and dry.  Psychiatric: Judgment normal. Her affect is blunt. Her speech is delayed. She is slowed. Thought content is not paranoid. Cognition and memory are normal. She expresses no homicidal and no suicidal ideation.    Review of Systems  Constitutional: Negative.   HENT: Negative.   Eyes: Negative.   Respiratory: Negative.   Cardiovascular: Negative.   Gastrointestinal: Negative.   Musculoskeletal: Negative.   Skin: Negative.   Neurological: Negative.   Psychiatric/Behavioral: Positive for depression. Negative for hallucinations, memory loss, substance abuse and suicidal ideas. The patient is not nervous/anxious and does not have insomnia.     Blood pressure 113/79, pulse 89, temperature 99.3 F (37.4 C), resp. rate (!) 21, height '5\' 6"'  (1.676 m), weight 92.5 kg, last menstrual period 09/19/2018, SpO2 100 %.Body mass index is 32.93 kg/m.  General Appearance: Fairly Groomed  Eye Contact:  Fair  Speech:  Slow  Volume:  Decreased  Mood:  Euthymic  Affect:  Constricted  Thought Process:  Coherent  Orientation:  Full (Time, Place, and Person)  Thought Content:  Logical  Suicidal Thoughts:  No  Homicidal Thoughts:  No  Memory:  Immediate;   Fair Recent;    Fair Remote;   Fair  Judgement:  Fair  Insight:  Fair  Psychomotor Activity:  Decreased  Concentration:  Concentration: Fair  Recall:  [employer] of Knowledge:  Fair  Language:  Fair  Akathisia:  No  Handed:  Right  AIMS (if indicated):     Assets:  Desire for Improvement Housing Physical Health Resilience Social Support  ADL's:  Intact  Cognition:  WNL  Sleep:  Number of Hours: 7.75     Have you used any form of tobacco in the last 30 days? (Cigarettes, Smokeless Tobacco, Cigars, and/or Pipes): No  Has this patient used any form of tobacco in the last 30 days? (Cigarettes, Smokeless Tobacco, Cigars, and/or Pipes) Yes, No  Blood Alcohol level:  Lab Results  Component Value Date   [facility] <10 09/25/2018   ETH <5 50/93/2671    Metabolic Disorder Labs:  Lab Results  Component Value Date   HGBA1C 8.5 (H) 09/26/2018   MPG 197.25 09/26/2018   MPG 139.85 05/07/2018   No results found for: PROLACTIN Lab Results  Component Value Date   CHOL 186 09/26/2018   TRIG 107 09/26/2018   HDL 56 09/26/2018   CHOLHDL 3.3 09/26/2018   VLDL 21 09/26/2018   LDLCALC 109 (H) 09/26/2018   LDLCALC 117 (H) 05/07/2018    See Psychiatric Specialty Exam and Suicide Risk Assessment completed by Attending Physician prior to discharge.  Discharge destination:  Home  Is patient on multiple antipsychotic therapies at discharge:  No   Has Patient had three or more failed trials of antipsychotic monotherapy by history:  No  Recommended Plan for Multiple Antipsychotic Therapies: NA  Discharge Instructions    Diet - low sodium heart healthy   Complete by:  As directed    Increase activity slowly   Complete by:  As directed      Allergies as of 09/29/2018   No Known Allergies     Medication List    STOP taking these medications   docusate sodium 100 MG capsule Commonly known as:  COLACE   gabapentin 300 MG capsule Commonly known as:  NEURONTIN   metformin 1000 MG (OSM) 24 hr  tablet Commonly known as:  FORTAMET Replaced by:  metFORMIN 1000 MG tablet     TAKE these medications     Indication  ARIPiprazole 10 MG tablet Commonly known as:  ABILIFY Take 1 tablet (10 mg total) by mouth daily. What changed:    medication strength  how much to take  when to take this  Indication:  Major Depressive Disorder   dapagliflozin propanediol 10 MG Tabs tablet Commonly known as:  FARXIGA Take 10 mg by mouth daily.  Indication:  Type 2 Diabetes   ferrous sulfate 325 (65 FE) MG tablet Take 1 tablet (325 mg total) by mouth daily with breakfast.  Indication:  Iron Deficiency   FLUoxetine 40 MG capsule Commonly known as:  PROZAC Take 1 capsule (40 mg total) by mouth daily. What changed:  when to take this  Indication:  Depression   metFORMIN 1000 MG tablet Commonly known as:  GLUCOPHAGE Take 1 tablet (1,000 mg total) by mouth 2 (two) times daily with a meal. Replaces:  metformin 1000 MG (OSM) 24 hr tablet  Indication:  Type 2 Diabetes   traZODone 100 MG tablet Commonly known as:  DESYREL Take 1 tablet (100 mg total) by mouth at bedtime. What changed:    medication strength  how much to take  Indication:  Trouble Sleeping      Follow-up Information    [facility] Follow up on 10/06/2018.   Why:  PLEASE ARRIVE BY 8:15 Register at [place] MIDNIGHT THE NIGHT BEFORE TREATMENT Contact information: [address] [id number] [address] [place] [phone]          Follow-up recommendations:  Activity:  Activity as tolerated Diet:  Diabetic diet Other:  Follow-up with ECT next Monday continue current medicine.  Take care of diabetes.  Comments: We also reviewed her diabetes which is not very well controlled.  She is to stay on a diabetic diet take her medication and we will reassess that.  She really needs to be seeing a primary care doctor for this as  well.  Signed: Alethia Berthold, MD 09/29/2018, 3:58 PM

## 2018-09-29 NOTE — Procedures (Signed)
ECT SERVICES Physician's Interval Evaluation & Treatment Note  Patient Identification: Renee Turner MRN:  [id number] Date of Evaluation:  09/29/2018 TX #: [id number]  MADRS:   MMSE:   P.E. Findings:  No change  Psychiatric Interval Note:  Still blunted but denies suicidal thoughts  Subjective:  Patient is a 22 y.o. female seen for evaluation for Electroconvulsive Therapy. Mildly depressed  Treatment Summary:   _0   Right Unilateral             _1  Bilateral   % Energy : 0.3 ms 50%   Impedance: 2400 ohms  Seizure Energy Index: 5335 V squared  Postictal Suppression Index: 66%  Seizure Concordance Index: 84%  Medications  Pre Shock: Robinul 0.1 mg labetalol 10 mg esmolol 30 Mil[place] Toradol 30 mg Brevital 150 mg succinylcholine 100 mg  Post Shock:    Seizure Duration: 24 seconds by EMG 49 seconds by EEG   Comments: Follow-up 1 week from today as an outpatient  Lungs:  _2   Clear to auscultation               _3  Other:   Heart:    _4   Regular rhythm             _5  irregular rhythm    _6   Previous H&P reviewed, patient examined and there are NO CHANGES                 _7   Previous H&P reviewed, patient examined and there are changes noted.   Alethia Berthold, MD 11/25/201911:01 AM

## 2018-09-29 NOTE — Anesthesia Preprocedure Evaluation (Signed)
Anesthesia Evaluation  Patient identified by MRN, date of birth, ID band Patient awake    Reviewed: Allergy & Precautions, H&P , NPO status , Patient's Chart, lab work & pertinent test results  History of Anesthesia Complications Negative for: history of anesthetic complications  Airway Mallampati: III       Dental  (+) Chipped   Pulmonary neg pulmonary ROS,           Cardiovascular negative cardio ROS       Neuro/Psych PSYCHIATRIC DISORDERS Anxiety Depression negative neurological ROS     GI/Hepatic negative GI ROS, Neg liver ROS,   Endo/Other  diabetes, Type 2, Oral Hypoglycemic Agents  Renal/GU negative Renal ROS  negative genitourinary   Musculoskeletal   Abdominal   Peds  Hematology negative hematology ROS (+)   Anesthesia Other Findings   Reproductive/Obstetrics negative OB ROS                             Anesthesia Physical  Anesthesia Plan  ASA: III  Anesthesia Plan: General   Post-op Pain Management:    Induction:   PONV Risk Score and Plan:   Airway Management Planned: Mask  Additional Equipment:   Intra-op Plan:   Post-operative Plan:   Informed Consent: I have reviewed the patients History and Physical, chart, labs and discussed the procedure including the risks, benefits and alternatives for the proposed anesthesia with the patient or authorized representative who has indicated his/her understanding and acceptance.     Plan Discussed with: Anesthesiologist, CRNA and Surgeon  Anesthesia Plan Comments:         Anesthesia Quick Evaluation
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
## 2018-09-29 NOTE — Anesthesia Procedure Notes (Signed)
Date/Time: 09/29/2018 10:54 AM Performed by: Lily KocherPeralta, Ledonna Dormer, CRNA Pre-anesthesia Checklist: Patient identified, Emergency Drugs available, Suction available and Patient being monitored Patient Re-evaluated:Patient Re-evaluated prior to induction Oxygen Delivery Method: Circle system utilized Preoxygenation: Pre-oxygenation with 100% oxygen Induction Type: IV induction Ventilation: Mask ventilation without difficulty and Mask ventilation throughout procedure Airway Equipment and Method: Bite block Placement Confirmation: positive ETCO2 Dental Injury: Teeth and Oropharynx as per pre-operative assessment

## 2018-09-29 NOTE — Anesthesia Post-op Follow-up Note (Signed)
Anesthesia QCDR form completed.        

## 2018-09-29 NOTE — Progress Notes (Signed)
Inpatient Diabetes Program Recommendations  AACE/ADA: New Consensus Statement on Inpatient Glycemic Control (2019)  Target Ranges:  Prepandial:   less than 140 mg/dL      Peak postprandial:   less than 180 mg/dL (1-2 hours)      Critically ill patients:  140 - 180 mg/dL   Results for Renee Turner, Renee Turner (MRN [id number]) as of 09/29/2018 08:03  Ref. Range 09/28/2018 07:04 09/28/2018 11:43 09/28/2018 16:18 09/28/2018 20:35 09/29/2018 06:50  Glucose-Capillary Latest Ref Range: 70 - 99 mg/dL 294 (H) 255 (H) 272 (H) 196 (H) 217 (H)  Results for Renee Turner, Renee Turner (MRN [id number]) as of 09/29/2018 08:03  Ref. Range 09/26/2018 06:57  Hemoglobin A1C Latest Ref Range: 4.8 - 5.6 % 8.5 (H)   Review of Glycemic Control  Diabetes history: DM2 Outpatient Diabetes medications: Farxiga 10 mg daily, Metformin 1000 mg BID Current orders for Inpatient glycemic control: Novolog 0-15 units TID with meals, Metformin 1000 mg BID  Inpatient Diabetes Program Recommendations:  Insulin - Basal: Please consider ordering Lantus 8 units Q24H starting now. Correction (SSI): Please consider adding Novolog 0-5 units QHS for bedtime correction. Diet: If appropriate, please consider changing diet to Carb Modified.  Thanks, Barnie Alderman, RN, MSN, CDE Diabetes Coordinator Inpatient Diabetes Program [phone] (Team Pager from 8am to 5pm)

## 2018-09-29 NOTE — Progress Notes (Signed)
Urine p[regnancy test done with negative results

## 2018-09-29 NOTE — Anesthesia Postprocedure Evaluation (Signed)
Anesthesia Post Note  Patient: Renee Turner  Procedure(s) Performed: ECT TX  Patient location during evaluation: PACU Anesthesia Type: General Level of consciousness: sedated Pain management: pain level controlled Vital Signs Assessment: post-procedure vital signs reviewed and stable Respiratory status: spontaneous breathing and respiratory function stable Cardiovascular status: blood pressure returned to baseline and stable Anesthetic complications: no     Last Vitals:  Vitals:   09/29/18 1130 09/29/18 1136  BP:  113/79  Pulse: 99 88  Resp: (!) 23 (!) 21  Temp:  37.4 C  SpO2:  100%    Last Pain:  Vitals:   09/29/18 1136  TempSrc:   PainSc: 0-No pain                 Leocadio Heal K

## 2018-09-29 NOTE — Plan of Care (Signed)
  Problem: Safety: Goal: Ability to disclose and discuss suicidal ideas will improve Outcome: Progressing  Patient denies suicidal ideation   

## 2018-10-05 ENCOUNTER — Other Ambulatory Visit: Payer: Self-pay | Admitting: Psychiatry

## 2018-10-06 ENCOUNTER — Telehealth: Payer: Self-pay | Admitting: *Deleted

## 2018-10-17 ENCOUNTER — Telehealth (HOSPITAL_COMMUNITY): Payer: Self-pay | Admitting: *Deleted

## 2018-10-17 ENCOUNTER — Telehealth: Payer: Self-pay

## 2018-10-17 NOTE — Telephone Encounter (Signed)
Prior authorization for ECT called to [facility]. Spoke with Lynnae SandhoffSusanne who gave 7 sessions from 10/20/18-11/04/18. Auth #[id number].

## 2018-10-19 ENCOUNTER — Other Ambulatory Visit: Payer: Self-pay | Admitting: Psychiatry

## 2018-10-20 ENCOUNTER — Encounter: Payer: Self-pay | Admitting: Anesthesiology

## 2018-10-20 ENCOUNTER — Encounter
Admission: RE | Admit: 2018-10-20 | Discharge: 2018-10-20 | Disposition: A | Discharge status: Home / Self Care | Payer: BLUE CROSS/BLUE SHIELD | Source: Ambulatory Visit | Attending: Psychiatry | Admitting: Psychiatry

## 2018-10-20 ENCOUNTER — Ambulatory Visit: Payer: Self-pay | Admitting: Anesthesiology

## 2018-10-20 DIAGNOSIS — F339 Major depressive disorder, recurrent, unspecified: Secondary | ICD-10-CM | POA: Insufficient documentation

## 2018-10-20 DIAGNOSIS — F332 Major depressive disorder, recurrent severe without psychotic features: Secondary | ICD-10-CM

## 2018-10-20 DIAGNOSIS — R45851 Suicidal ideations: Secondary | ICD-10-CM | POA: Diagnosis not present

## 2018-10-20 DIAGNOSIS — E119 Type 2 diabetes mellitus without complications: Secondary | ICD-10-CM | POA: Diagnosis not present

## 2018-10-20 LAB — GLUCOSE, CAPILLARY: Glucose-Capillary: 190 mg/dL — ABNORMAL HIGH (ref 70–99)

## 2018-10-20 MED ORDER — METHOHEXITAL SODIUM 100 MG/10ML IV SOSY
PREFILLED_SYRINGE | INTRAVENOUS | Status: DC | PRN
  Administered 2018-10-20: 150 mg via INTRAVENOUS

## 2018-10-20 MED ORDER — GLYCOPYRROLATE 0.2 MG/ML IJ SOLN
0.1000 mg | Freq: Once | INTRAMUSCULAR | Status: AC
  Administered 2018-10-20: 0.1 mg via INTRAVENOUS

## 2018-10-20 MED ORDER — KETOROLAC TROMETHAMINE 30 MG/ML IJ SOLN
INTRAMUSCULAR | Status: AC
  Administered 2018-10-20: 30 mg via INTRAVENOUS
  Filled 2018-10-20: qty 1

## 2018-10-20 MED ORDER — ESMOLOL HCL 100 MG/10ML IV SOLN
INTRAVENOUS | Status: DC | PRN
  Administered 2018-10-20: 30 mg via INTRAVENOUS

## 2018-10-20 MED ORDER — ONDANSETRON HCL 4 MG/2ML IJ SOLN: 4.0000 mg | Freq: Once | INTRAMUSCULAR | Status: DC | PRN

## 2018-10-20 MED ORDER — LABETALOL HCL 5 MG/ML IV SOLN
INTRAVENOUS | Status: DC | PRN
  Administered 2018-10-20: 10 mg via INTRAVENOUS

## 2018-10-20 MED ORDER — KETOROLAC TROMETHAMINE 30 MG/ML IJ SOLN
30.0000 mg | Freq: Once | INTRAMUSCULAR | Status: AC
  Administered 2018-10-20: 30 mg via INTRAVENOUS

## 2018-10-20 MED ORDER — GLYCOPYRROLATE 0.2 MG/ML IJ SOLN
INTRAMUSCULAR | Status: AC
  Administered 2018-10-20: 0.1 mg via INTRAVENOUS
  Filled 2018-10-20: qty 1

## 2018-10-20 MED ORDER — SODIUM CHLORIDE 0.9 % IV SOLN
500.0000 mL | Freq: Once | INTRAVENOUS | Status: AC
  Administered 2018-10-20: 500 mL via INTRAVENOUS

## 2018-10-20 MED ORDER — GLYCOPYRROLATE 0.2 MG/ML IJ SOLN: 0.1000 mg | Freq: Once | INTRAMUSCULAR | Status: DC

## 2018-10-20 MED ORDER — SUCCINYLCHOLINE 20MG/ML (10ML) SYRINGE FOR MEDFUSION PUMP - OPTIME
INTRAMUSCULAR | Status: DC | PRN
  Administered 2018-10-20: 100 mg via INTRAVENOUS

## 2018-10-20 MED ORDER — SODIUM CHLORIDE 0.9 % IV SOLN
500.0000 mL | Freq: Once | INTRAVENOUS | Status: AC
  Administered 2018-10-20: 10:00:00 via INTRAVENOUS

## 2018-10-20 NOTE — Discharge Instructions (Signed)
1)  The drugs that you have been given will stay in your system until tomorrow so for the       next 24 hours you should not:  A. Drive an automobile  B. Make any legal decisions  C. Drink any alcoholic beverages  2)  You may resume your regular meals upon return home.  3)  A responsible adult must take you home.  Someone should stay with you for a few          hours, then be available by phone for the remainder of the treatment day.  4)  You May experience any of the following symptoms:  Headache, Nausea and a dry mouth (due to the medications you were given),  temporary memory loss and some confusion, or sore muscles (a warm bath  should help this).  If you you experience any of these symptoms let us know on                your return visit.  5)  Report any of the following: any acute discomfort, severe headache, or temperature        greater than 100.5 F.   Also report any unusual redness, swelling, drainage, or pain         at your IV site.    You may report Symptoms to:  ECT PROGRAM- [place] [facility]          Phone: [phone], ECT Department           or Dr. Shary Keylapac's office [phone]  6)  Your next ECT Treatment is Monday December 30   We will call 2 days prior to your scheduled appointment for arrival times.  7)  Nothing to eat or drink after midnight the night before your procedure.  8)  Take     With a sip of water the morning of your procedure.  9)  Other Instructions: Call [phone] to cancel the morning of your procedure due         to illness or emergency.  10) We will call within 72 hours to assess how you are feeling.

## 2018-10-20 NOTE — Anesthesia Postprocedure Evaluation (Signed)
Anesthesia Post Note  Patient: Renee Turner  Procedure(s) Performed: ECT TX  Patient location during evaluation: PACU Anesthesia Type: General Level of consciousness: awake and alert Pain management: pain level controlled Vital Signs Assessment: post-procedure vital signs reviewed and stable Respiratory status: spontaneous breathing and respiratory function stable Cardiovascular status: stable Anesthetic complications: no     Last Vitals:  Vitals:   10/20/18 1054 10/20/18 1100  BP: 116/73 110/68  Pulse: 85 78  Resp: 18 18  Temp: 37 C   SpO2: 100% 100%    Last Pain:  Vitals:   10/20/18 0930  TempSrc:   PainSc: 0-No pain                 Ellenora Talton K

## 2018-10-20 NOTE — Anesthesia Preprocedure Evaluation (Signed)
Anesthesia Evaluation  Patient identified by MRN, date of birth, ID band Patient awake    Reviewed: Allergy & Precautions, H&P , NPO status , Patient's Chart, lab work & pertinent test results  History of Anesthesia Complications Negative for: history of anesthetic complications  Airway Mallampati: III       Dental  (+) Chipped   Pulmonary neg pulmonary ROS,           Cardiovascular negative cardio ROS       Neuro/Psych PSYCHIATRIC DISORDERS Anxiety Depression negative neurological ROS     GI/Hepatic negative GI ROS, Neg liver ROS,   Endo/Other  diabetes, Type 2, Oral Hypoglycemic Agents  Renal/GU negative Renal ROS  negative genitourinary   Musculoskeletal   Abdominal   Peds  Hematology negative hematology ROS (+)   Anesthesia Other Findings   Reproductive/Obstetrics negative OB ROS                             Anesthesia Physical  Anesthesia Plan  ASA: III  Anesthesia Plan: General   Post-op Pain Management:    Induction:   PONV Risk Score and Plan:   Airway Management Planned: Mask  Additional Equipment:   Intra-op Plan:   Post-operative Plan:   Informed Consent: I have reviewed the patients History and Physical, chart, labs and discussed the procedure including the risks, benefits and alternatives for the proposed anesthesia with the patient or authorized representative who has indicated his/her understanding and acceptance.     Plan Discussed with: Anesthesiologist, CRNA and Surgeon  Anesthesia Plan Comments:         Anesthesia Quick Evaluation  

## 2018-10-20 NOTE — Procedures (Signed)
ECT SERVICES Physician's Interval Evaluation & Treatment Note  Patient Identification: Renee Turner MRN:  [id number] Date of Evaluation:  10/20/2018 TX #: 16  MADRS:   MMSE:   P.E. Findings:  No Change to physical exam  Psychiatric Interval Note:  Patient says her mood is feeling okay denies being depressed.  Usual flat affect.  Subjective:  Patient is a 22 y.o. female seen for evaluation for Electroconvulsive Therapy. Denies depression or suicidal thoughts  Treatment Summary:   _0   Right Unilateral             _1  Bilateral   % Energy : 0.3 ms 50%   Impedance: 1120 ohms  Seizure Energy Index: 20,093 V squared  Postictal Suppression Index: 48%  Seizure Concordance Index: 96%  Medications  Pre Shock: Robinul 0.1 mg labetalol 10 mg esmolol 30 mg Toradol 30 mg Brevital 150 mg succinylcholine 100 mg  Post Shock:    Seizure Duration: 22 seconds by motion 35 seconds by EEG   Comments: Follow-up 2 weeks on the 30th  Lungs:  _2   Clear to auscultation               _3  Other:   Heart:    _4   Regular rhythm             _5  irregular rhythm    _6   Previous H&P reviewed, patient examined and there are NO CHANGES                 _7   Previous H&P reviewed, patient examined and there are changes noted.   Alethia Berthold, MD 12/16/201910:36 AM

## 2018-10-20 NOTE — Transfer of Care (Signed)
Immediate Anesthesia Transfer of Care Note  Patient: Renee Turner  Procedure(s) Performed: ECT TX  Patient Location: PACU  Anesthesia Type:General  Level of Consciousness: awake, alert  and oriented  Airway & Oxygen Therapy: Patient Spontanous Breathing and Patient connected to face mask oxygen  Post-op Assessment: Report given to RN and Post -op Vital signs reviewed and stable  Post vital signs: Reviewed and stable  Last Vitals:  Vitals Value Taken Time  BP 159/143 10/20/2018 10:49 AM  Temp    Pulse 103 10/20/2018 10:51 AM  Resp 0 10/20/2018 10:51 AM  SpO2 94 % 10/20/2018 10:51 AM    Last Pain:  Vitals:   10/20/18 0930  TempSrc:   PainSc: 0-No pain         Complications: No apparent anesthesia complications

## 2018-10-20 NOTE — Anesthesia Post-op Follow-up Note (Signed)
Anesthesia QCDR form completed.        

## 2018-10-20 NOTE — H&P (Signed)
Renee Turner is an 22 y.o. female.   Chief Complaint: Patient reports mood is not much different than before.  Fairly stable.  Not suicidal HPI: History of recurrent severe depression shows some response to ECT along with other treatment  Past Medical History:  Diagnosis Date  . Anxiety   . Depression   . Diabetes mellitus without complication ([place])    Type II  . Diabetes mellitus, type II ([place])     History reviewed. No pertinent surgical history.  History reviewed. No pertinent family history. Social History:  reports that she has never smoked. She has never used smokeless tobacco. She reports previous alcohol use. She reports that she does not use drugs.  Allergies: No Known Allergies  (Not in a hospital admission)   Results for orders placed or performed during the hospital encounter of 10/20/18 (from the past 48 hour(s))  Glucose, capillary     Status: Abnormal   Collection Time: 10/20/18  9:48 AM  Result Value Ref Range   Glucose-Capillary 190 (H) 70 - 99 mg/dL   No results found.  Review of Systems  Constitutional: Negative.   HENT: Negative.   Eyes: Negative.   Respiratory: Negative.   Cardiovascular: Negative.   Gastrointestinal: Negative.   Musculoskeletal: Negative.   Skin: Negative.   Neurological: Negative.   Psychiatric/Behavioral: Negative for depression, hallucinations, memory loss, substance abuse and suicidal ideas. The patient is not nervous/anxious and does not have insomnia.     Blood pressure 116/89, pulse 80, temperature 98.6 F (37 C), temperature source Oral, resp. rate 18, height _0  (1.676 m), weight 92.1 kg. Physical Exam  Nursing note and vitals reviewed. Constitutional: She appears well-developed and well-nourished.  HENT:  Head: Normocephalic and atraumatic.  Eyes: Pupils are equal, round, and reactive to light. Conjunctivae are normal.  Neck: Normal range of motion.  Cardiovascular: Regular rhythm and normal heart sounds.   Respiratory: Effort normal.  GI: Soft.  Musculoskeletal: Normal range of motion.  Neurological: She is alert.  Skin: Skin is warm and dry.  Psychiatric: Judgment normal. Her affect is blunt. Her speech is delayed. She is slowed. Cognition and memory are normal. She expresses no suicidal ideation.     Assessment/Plan Patient reminded of the crucial need to be seeing an outpatient psychiatrist.  We will otherwise see her back in about 2 weeks.  Alethia Berthold, MD 10/20/2018, 10:34 AM

## 2018-10-24 ENCOUNTER — Telehealth: Payer: Self-pay

## 2018-10-31 ENCOUNTER — Other Ambulatory Visit: Payer: Self-pay | Admitting: Psychiatry

## 2018-11-03 ENCOUNTER — Encounter: Payer: Self-pay | Admitting: Anesthesiology

## 2018-11-03 ENCOUNTER — Encounter (HOSPITAL_COMMUNITY)
Admission: RE | Admit: 2018-11-03 | Discharge: 2018-11-03 | Disposition: A | Discharge status: Home / Self Care | Payer: BLUE CROSS/BLUE SHIELD | Source: Ambulatory Visit | Attending: Psychiatry | Admitting: Psychiatry

## 2018-11-03 DIAGNOSIS — F332 Major depressive disorder, recurrent severe without psychotic features: Secondary | ICD-10-CM | POA: Diagnosis not present

## 2018-11-03 DIAGNOSIS — F339 Major depressive disorder, recurrent, unspecified: Secondary | ICD-10-CM | POA: Diagnosis not present

## 2018-11-03 LAB — GLUCOSE, CAPILLARY: Glucose-Capillary: 235 mg/dL — ABNORMAL HIGH (ref 70–99)

## 2018-11-03 LAB — POCT PREGNANCY, URINE: Preg Test, Ur: NEGATIVE

## 2018-11-03 MED ORDER — METHOHEXITAL SODIUM 0.5 G IJ SOLR
INTRAMUSCULAR | Status: AC
  Filled 2018-11-03: qty 500

## 2018-11-03 MED ORDER — ESMOLOL HCL 100 MG/10ML IV SOLN
INTRAVENOUS | Status: DC | PRN
  Administered 2018-11-03: 30 mg via INTRAVENOUS

## 2018-11-03 MED ORDER — SODIUM CHLORIDE 0.9 % IV SOLN
500.0000 mL | Freq: Once | INTRAVENOUS | Status: AC
  Administered 2018-11-03: 500 mL via INTRAVENOUS

## 2018-11-03 MED ORDER — SODIUM CHLORIDE 0.9 % IV SOLN
INTRAVENOUS | Status: DC | PRN
  Administered 2018-11-03: 11:00:00 via INTRAVENOUS

## 2018-11-03 MED ORDER — GLYCOPYRROLATE 0.2 MG/ML IJ SOLN
INTRAMUSCULAR | Status: AC
  Administered 2018-11-03: 0.1 mg via INTRAVENOUS
  Filled 2018-11-03: qty 1

## 2018-11-03 MED ORDER — LABETALOL HCL 5 MG/ML IV SOLN
INTRAVENOUS | Status: AC
  Filled 2018-11-03: qty 4

## 2018-11-03 MED ORDER — GLYCOPYRROLATE 0.2 MG/ML IJ SOLN
0.1000 mg | Freq: Once | INTRAMUSCULAR | Status: AC
  Administered 2018-11-03: 0.1 mg via INTRAVENOUS

## 2018-11-03 MED ORDER — ESMOLOL HCL 100 MG/10ML IV SOLN
INTRAVENOUS | Status: AC
  Filled 2018-11-03: qty 10

## 2018-11-03 MED ORDER — SUCCINYLCHOLINE CHLORIDE 200 MG/10ML IV SOSY
PREFILLED_SYRINGE | INTRAVENOUS | Status: DC | PRN
  Administered 2018-11-03: 100 mg via INTRAVENOUS

## 2018-11-03 MED ORDER — KETOROLAC TROMETHAMINE 30 MG/ML IJ SOLN
INTRAMUSCULAR | Status: AC
  Administered 2018-11-03: 30 mg via INTRAVENOUS
  Filled 2018-11-03: qty 1

## 2018-11-03 MED ORDER — LABETALOL HCL 5 MG/ML IV SOLN
INTRAVENOUS | Status: DC | PRN
  Administered 2018-11-03: 10 mg via INTRAVENOUS

## 2018-11-03 MED ORDER — METHOHEXITAL SODIUM 100 MG/10ML IV SOSY
PREFILLED_SYRINGE | INTRAVENOUS | Status: DC | PRN
  Administered 2018-11-03: 120 mg via INTRAVENOUS

## 2018-11-03 MED ORDER — KETOROLAC TROMETHAMINE 30 MG/ML IJ SOLN
30.0000 mg | Freq: Once | INTRAMUSCULAR | Status: AC
  Administered 2018-11-03: 30 mg via INTRAVENOUS

## 2018-11-03 NOTE — H&P (Signed)
Renee Turner is an 22 y.o. female.   Chief Complaint: Patient has no new complaint.  Mood she feels is a little bit better. HPI: History of recurrent severe depression and PTSD  Past Medical History:  Diagnosis Date  . Anxiety   . Depression   . Diabetes mellitus without complication ([place])    Type II  . Diabetes mellitus, type II ([place])     History reviewed. No pertinent surgical history.  History reviewed. No pertinent family history. Social History:  reports that she has never smoked. She has never used smokeless tobacco. She reports previous alcohol use. She reports that she does not use drugs.  Allergies: No Known Allergies  (Not in a hospital admission)   Results for orders placed or performed during the hospital encounter of 11/03/18 (from the past 48 hour(s))  Pregnancy, urine POC     Status: None   Collection Time: 11/03/18  9:17 AM  Result Value Ref Range   Preg Test, Ur NEGATIVE NEGATIVE    Comment:        THE SENSITIVITY OF THIS METHODOLOGY IS >24 mIU/mL    No results found.  Review of Systems  Constitutional: Negative.   HENT: Negative.   Eyes: Negative.   Respiratory: Negative.   Cardiovascular: Negative.   Gastrointestinal: Negative.   Musculoskeletal: Negative.   Skin: Negative.   Neurological: Negative.   Psychiatric/Behavioral: Negative for depression, hallucinations, memory loss, substance abuse and suicidal ideas. The patient is nervous/anxious and has insomnia.     Blood pressure 117/83, pulse 70, temperature 98.2 F (36.8 C), temperature source Oral, resp. rate 18, height '5\' 6"'  (1.676 m), weight 95.7 kg, SpO2 98 %. Physical Exam  Nursing note and vitals reviewed. Constitutional: She appears well-developed and well-nourished.  HENT:  Head: Normocephalic and atraumatic.  Eyes: Pupils are equal, round, and reactive to light. Conjunctivae are normal.  Neck: Normal range of motion.  Cardiovascular: Regular rhythm and normal heart sounds.   Respiratory: Effort normal. No respiratory distress.  GI: Soft.  Musculoskeletal: Normal range of motion.  Neurological: She is alert.  Skin: Skin is warm and dry.  Psychiatric: Judgment normal. Her affect is blunt. Her speech is delayed. She is slowed. Cognition and memory are normal. She expresses no suicidal ideation.     Assessment/Plan Treatment today and we will see her back in 2 weeks  Alethia Berthold, MD 11/03/2018, 11:21 AM

## 2018-11-03 NOTE — Anesthesia Preprocedure Evaluation (Signed)
Anesthesia Evaluation  Patient identified by MRN, date of birth, ID band Patient awake    Reviewed: Allergy & Precautions, H&P , NPO status , Patient's Chart, lab work & pertinent test results  History of Anesthesia Complications Negative for: history of anesthetic complications  Airway Mallampati: III       Dental  (+) Chipped   Pulmonary neg pulmonary ROS,           Cardiovascular negative cardio ROS       Neuro/Psych PSYCHIATRIC DISORDERS Anxiety Depression negative neurological ROS     GI/Hepatic negative GI ROS, Neg liver ROS,   Endo/Other  diabetes, Type 2, Oral Hypoglycemic Agents  Renal/GU negative Renal ROS  negative genitourinary   Musculoskeletal   Abdominal   Peds  Hematology negative hematology ROS (+)   Anesthesia Other Findings   Reproductive/Obstetrics negative OB ROS                             Anesthesia Physical  Anesthesia Plan  ASA: III  Anesthesia Plan: General   Post-op Pain Management:    Induction:   PONV Risk Score and Plan:   Airway Management Planned: Mask  Additional Equipment:   Intra-op Plan:   Post-operative Plan:   Informed Consent: I have reviewed the patients History and Physical, chart, labs and discussed the procedure including the risks, benefits and alternatives for the proposed anesthesia with the patient or authorized representative who has indicated his/her understanding and acceptance.     Plan Discussed with: Anesthesiologist, CRNA and Surgeon  Anesthesia Plan Comments:         Anesthesia Quick Evaluation  

## 2018-11-03 NOTE — Discharge Instructions (Signed)
1)  The drugs that you have been given will stay in your system until tomorrow so for the       next 24 hours you should not:  A. Drive an automobile  B. Make any legal decisions  C. Drink any alcoholic beverages  2)  You may resume your regular meals upon return home.  3)  A responsible adult must take you home.  Someone should stay with you for a few          hours, then be available by phone for the remainder of the treatment day.  4)  You May experience any of the following symptoms:  Headache, Nausea and a dry mouth (due to the medications you were given),  temporary memory loss and some confusion, or sore muscles (a warm bath  should help this).  If you you experience any of these symptoms let us know on                your return visit.  5)  Report any of the following: any acute discomfort, severe headache, or temperature        greater than 100.5 F.   Also report any unusual redness, swelling, drainage, or pain         at your IV site.    You may report Symptoms to:  [facility] [place] [facility]          Phone: [phone], ECT Department           or Dr. Shary Keylapac's office [phone]  6)  Your next ECT Treatment is Day Monday  Date November 17, 2018  We will call 2 days prior to your scheduled appointment for arrival times.  7)  Nothing to eat or drink after midnight the night before your procedure.  8)  Take .    With a sip of water the morning of your procedure.  9)  Other Instructions: Call [phone] to cancel the morning of your procedure due         to illness or emergency.  10) We will call within 72 hours to assess how you are feeling.

## 2018-11-03 NOTE — Anesthesia Procedure Notes (Signed)
Date/Time: 11/03/2018 11:34 AM Performed by: Lily KocherPeralta, Micaiah Remillard, CRNA Pre-anesthesia Checklist: Patient identified, Emergency Drugs available, Suction available and Patient being monitored Patient Re-evaluated:Patient Re-evaluated prior to induction Oxygen Delivery Method: Circle system utilized Preoxygenation: Pre-oxygenation with 100% oxygen Induction Type: IV induction Ventilation: Mask ventilation without difficulty and Mask ventilation throughout procedure Airway Equipment and Method: Bite block Placement Confirmation: positive ETCO2 Dental Injury: Teeth and Oropharynx as per pre-operative assessment

## 2018-11-03 NOTE — Anesthesia Post-op Follow-up Note (Signed)
Anesthesia QCDR form completed.        

## 2018-11-03 NOTE — Procedures (Signed)
ECT SERVICES Physician's Interval Evaluation & Treatment Note  Patient Identification: Renee Turner MRN:  [id number] Date of Evaluation:  11/03/2018 TX #: 17  MADRS:   MMSE:   P.E. Findings:  No change in physical exam  Psychiatric Interval Note:  Reports that mood is a little better.  She is smiling more.  Subjective:  Patient is a 22 y.o. female seen for evaluation for Electroconvulsive Therapy. Feels like she is less depressed  Treatment Summary:   [x]  Right Unilateral             [] Bilateral   % Energy : 0.3 ms 50%   Impedance: 1240 ohms  Seizure Energy Index: 17,062 V squared  Postictal Suppression Index: Less than 10%  Seizure Concordance Index: 66%  Medications  Pre Shock: Robinul 0.1 mg labetalol 10 mg esmolol 30 mg Toradol 30 mg Brevital 150 mg succinylcholine 100 mg  Post Shock:    Seizure Duration: 31 seconds EMG 48 seconds EEG   Comments: Follow-up in 2 weeks  Lungs:  [x]  Clear to auscultation               [] Other:   Heart:    [x]  Regular rhythm             [] irregular rhythm    [x]  Previous H&P reviewed, patient examined and there are NO CHANGES                 []  Previous H&P reviewed, patient examined and there are changes noted.   Alethia Berthold, MD 12/30/201911:22 AM

## 2018-11-03 NOTE — Transfer of Care (Signed)
Immediate Anesthesia Transfer of Care Note  Patient: Renee Turner  Procedure(s) Performed: ECT TX  Patient Location: PACU  Anesthesia Type:General  Level of Consciousness: sedated  Airway & Oxygen Therapy: Patient Spontanous Breathing and Patient connected to face mask oxygen  Post-op Assessment: Report given to RN and Post -op Vital signs reviewed and stable  Post vital signs: Reviewed and stable  Last Vitals:  Vitals Value Taken Time  BP 108/71 11/03/2018 11:40 AM  Temp 37 C 11/03/2018 11:40 AM  Pulse 69 11/03/2018 11:40 AM  Resp 16 11/03/2018 11:40 AM  SpO2      Last Pain:  Vitals:   11/03/18 1140  TempSrc:   PainSc: 0-No pain         Complications: No apparent anesthesia complications

## 2018-11-03 NOTE — Anesthesia Postprocedure Evaluation (Signed)
Anesthesia Post Note  Patient: Graziella Nyirwot Dafoe  Procedure(s) Performed: ECT TX  Patient location during evaluation: PACU Anesthesia Type: General Level of consciousness: awake and alert Pain management: pain level controlled Vital Signs Assessment: post-procedure vital signs reviewed and stable Respiratory status: spontaneous breathing, nonlabored ventilation, respiratory function stable and patient connected to nasal cannula oxygen Cardiovascular status: blood pressure returned to baseline and stable Postop Assessment: no apparent nausea or vomiting Anesthetic complications: no     Last Vitals:  Vitals:   11/03/18 1214 11/03/18 1219  BP: 109/80 (!) 128/91  Pulse:  74  Resp:  18  Temp:    SpO2:      Last Pain:  Vitals:   11/03/18 1219  TempSrc:   PainSc: 0-No pain                 Martha Clan

## 2018-11-14 ENCOUNTER — Telehealth (HOSPITAL_COMMUNITY): Payer: Self-pay | Admitting: *Deleted

## 2018-11-14 ENCOUNTER — Telehealth: Payer: Self-pay

## 2018-11-14 NOTE — Telephone Encounter (Signed)
Called [employer] for prior auth of ECT. Spoke with Mont Dutton who faxed information sheets to fill out and fax back with clinicals. Information submitted for review and awaiting decision.

## 2018-11-17 ENCOUNTER — Other Ambulatory Visit: Payer: Self-pay | Admitting: Psychiatry

## 2018-11-17 ENCOUNTER — Encounter
Admission: RE | Admit: 2018-11-17 | Discharge: 2018-11-17 | Disposition: A | Discharge status: Home / Self Care | Payer: BLUE CROSS/BLUE SHIELD | Source: Ambulatory Visit | Attending: Psychiatry | Admitting: Psychiatry

## 2018-11-17 ENCOUNTER — Ambulatory Visit: Payer: Self-pay | Admitting: Anesthesiology

## 2018-11-17 ENCOUNTER — Encounter: Payer: Self-pay | Admitting: Anesthesiology

## 2018-11-17 DIAGNOSIS — R45851 Suicidal ideations: Secondary | ICD-10-CM | POA: Diagnosis not present

## 2018-11-17 DIAGNOSIS — E119 Type 2 diabetes mellitus without complications: Secondary | ICD-10-CM | POA: Diagnosis not present

## 2018-11-17 DIAGNOSIS — F332 Major depressive disorder, recurrent severe without psychotic features: Secondary | ICD-10-CM

## 2018-11-17 DIAGNOSIS — F339 Major depressive disorder, recurrent, unspecified: Secondary | ICD-10-CM | POA: Diagnosis not present

## 2018-11-17 MED ORDER — METHOHEXITAL SODIUM 100 MG/10ML IV SOSY
PREFILLED_SYRINGE | INTRAVENOUS | Status: DC | PRN
  Administered 2018-11-17: 150 mg via INTRAVENOUS

## 2018-11-17 MED ORDER — ESMOLOL HCL 100 MG/10ML IV SOLN
INTRAVENOUS | Status: DC | PRN
  Administered 2018-11-17: 30 mg via INTRAVENOUS

## 2018-11-17 MED ORDER — ESMOLOL HCL 100 MG/10ML IV SOLN
INTRAVENOUS | Status: AC
  Filled 2018-11-17: qty 10

## 2018-11-17 MED ORDER — METHOHEXITAL SODIUM 0.5 G IJ SOLR
INTRAMUSCULAR | Status: AC
  Filled 2018-11-17: qty 500

## 2018-11-17 MED ORDER — LABETALOL HCL 5 MG/ML IV SOLN
INTRAVENOUS | Status: AC
  Filled 2018-11-17: qty 4

## 2018-11-17 MED ORDER — SUCCINYLCHOLINE CHLORIDE 20 MG/ML IJ SOLN
INTRAMUSCULAR | Status: AC
  Filled 2018-11-17: qty 1

## 2018-11-17 MED ORDER — GLYCOPYRROLATE 0.2 MG/ML IJ SOLN
0.1000 mg | Freq: Once | INTRAMUSCULAR | Status: AC
  Administered 2018-11-17: 0.1 mg via INTRAVENOUS

## 2018-11-17 MED ORDER — SODIUM CHLORIDE 0.9 % IV SOLN
500.0000 mL | Freq: Once | INTRAVENOUS | Status: AC
  Administered 2018-11-17: 500 mL via INTRAVENOUS

## 2018-11-17 MED ORDER — LABETALOL HCL 5 MG/ML IV SOLN
INTRAVENOUS | Status: DC | PRN
  Administered 2018-11-17: 10 mg via INTRAVENOUS

## 2018-11-17 MED ORDER — GLYCOPYRROLATE 0.2 MG/ML IJ SOLN
INTRAMUSCULAR | Status: AC
  Administered 2018-11-17: 0.1 mg via INTRAVENOUS
  Filled 2018-11-17: qty 1

## 2018-11-17 MED ORDER — ONDANSETRON HCL 4 MG/2ML IJ SOLN: 4.0000 mg | Freq: Once | INTRAMUSCULAR | Status: DC | PRN

## 2018-11-17 MED ORDER — KETOROLAC TROMETHAMINE 30 MG/ML IJ SOLN
30.0000 mg | Freq: Once | INTRAMUSCULAR | Status: AC
  Administered 2018-11-17: 30 mg via INTRAVENOUS

## 2018-11-17 MED ORDER — SODIUM CHLORIDE 0.9 % IV SOLN
INTRAVENOUS | Status: DC | PRN
  Administered 2018-11-17: 10:00:00 via INTRAVENOUS

## 2018-11-17 MED ORDER — FENTANYL CITRATE (PF) 100 MCG/2ML IJ SOLN: 25.0000 ug | INTRAMUSCULAR | Status: DC | PRN

## 2018-11-17 MED ORDER — KETOROLAC TROMETHAMINE 30 MG/ML IJ SOLN
INTRAMUSCULAR | Status: AC
  Administered 2018-11-17: 30 mg via INTRAVENOUS
  Filled 2018-11-17: qty 1

## 2018-11-17 MED ORDER — SUCCINYLCHOLINE CHLORIDE 200 MG/10ML IV SOSY
PREFILLED_SYRINGE | INTRAVENOUS | Status: DC | PRN
  Administered 2018-11-17: 100 mg via INTRAVENOUS

## 2018-11-17 NOTE — H&P (Signed)
Renee Turner is an 23 y.o. female.   Chief Complaint: Patient continues to feel depressed.  Recent losses in her life making her feel worse.  No specific suicidal ideation HPI: History of recurrent depression complicated by noncompliance  Past Medical History:  Diagnosis Date  . Anxiety   . Depression   . Diabetes mellitus without complication ([place])    Type II  . Diabetes mellitus, type II ([place])     History reviewed. No pertinent surgical history.  History reviewed. No pertinent family history. Social History:  reports that she has never smoked. She has never used smokeless tobacco. She reports previous alcohol use. She reports that she does not use drugs.  Allergies: No Known Allergies  (Not in a hospital admission)   No results found for this or any previous visit (from the past 48 hour(s)). No results found.  Review of Systems  Constitutional: Negative.   HENT: Negative.   Eyes: Negative.   Respiratory: Negative.   Cardiovascular: Negative.   Gastrointestinal: Negative.   Musculoskeletal: Negative.   Skin: Negative.   Neurological: Negative.   Psychiatric/Behavioral: Positive for depression. Negative for hallucinations, memory loss, substance abuse and suicidal ideas. The patient is not nervous/anxious and does not have insomnia.     Blood pressure 112/77, pulse 67, temperature 98.7 F (37.1 C), temperature source Oral, resp. rate 18, height '5\' 6"'  (1.676 m), weight 93.4 kg, SpO2 98 %. Physical Exam  Nursing note and vitals reviewed. Constitutional: She appears well-developed and well-nourished.  HENT:  Head: Normocephalic and atraumatic.  Eyes: Pupils are equal, round, and reactive to light. Conjunctivae are normal.  Neck: Normal range of motion.  Cardiovascular: Regular rhythm and normal heart sounds.  Respiratory: Effort normal.  GI: Soft.  Musculoskeletal: Normal range of motion.  Neurological: She is alert.  Skin: Skin is warm and dry.  Psychiatric:  Judgment normal. Her affect is blunt. Her speech is delayed. She is slowed. Cognition and memory are normal. She expresses no suicidal ideation.     Assessment/Plan Patient cannot be convinced to be compliant with recommended treatment and only comes in for ECT periodically.  Unclear if this is really providing any lasting benefit but we will go ahead and schedule for another 2 weeks.  Patient is as usual encouraged in the strongest terms to be compliant with medication and recommended therapy  Alethia Berthold, MD 11/17/2018, 11:55 AM

## 2018-11-17 NOTE — Anesthesia Post-op Follow-up Note (Signed)
Anesthesia QCDR form completed.        

## 2018-11-17 NOTE — Procedures (Signed)
ECT SERVICES Physician's Interval Evaluation & Treatment Note  Patient Identification: Renee Turner MRN:  [id number] Date of Evaluation:  11/17/2018 TX #: 18  MADRS:   MMSE:   P.E. Findings:  No change to physical  Psychiatric Interval Note:  Mood feeling low  Subjective:  Patient is a 23 y.o. female seen for evaluation for Electroconvulsive Therapy. Passive suicidal thoughts no intent  Treatment Summary:   [x]  Right Unilateral             [] Bilateral   % Energy : 0.3 ms 50%   Impedance: 1490 ohms  Seizure Energy Index: 15,421 V squared  Postictal Suppression Index: 92%  Seizure Concordance Index: 97%  Medications  Pre Shock: Robinul 0.1 mg labetalol 10 mg esmolol 30 mg Toradol 30 mg Brevital 150 mg succinylcholine 100 mg  Post Shock:    Seizure Duration: 27 seconds EMG 50 seconds EEG   Comments: Follow-up 2 weeks  Lungs:  [x]  Clear to auscultation               [] Other:   Heart:    [x]  Regular rhythm             [] irregular rhythm    [x]  Previous H&P reviewed, patient examined and there are NO CHANGES                 []  Previous H&P reviewed, patient examined and there are changes noted.   Alethia Berthold, MD 1/13/202011:58 AM

## 2018-11-17 NOTE — Transfer of Care (Signed)
Immediate Anesthesia Transfer of Care Note  Patient: Renee Turner  Procedure(s) Performed: ECT TX  Patient Location: PACU  Anesthesia Type:General  Level of Consciousness: sedated  Airway & Oxygen Therapy: Patient Spontanous Breathing and Patient connected to face mask oxygen  Post-op Assessment: Report given to RN and Post -op Vital signs reviewed and stable  Post vital signs: Reviewed and stable  Last Vitals:  Vitals Value Taken Time  BP 106/63 11/17/2018 12:15 PM  Temp    Pulse 81 11/17/2018 12:15 PM  Resp 18 11/17/2018 12:15 PM  SpO2 100 % 11/17/2018 12:15 PM    Last Pain:  Vitals:   11/17/18 1215  TempSrc:   PainSc: Asleep         Complications: No apparent anesthesia complications

## 2018-11-17 NOTE — Anesthesia Preprocedure Evaluation (Signed)
Anesthesia Evaluation  Patient identified by MRN, date of birth, ID band Patient awake    Reviewed: Allergy & Precautions, H&P , NPO status , Patient's Chart, lab work & pertinent test results, reviewed documented beta blocker date and time   Airway Mallampati: II   Neck ROM: full    Dental  (+) Poor Dentition   Pulmonary neg pulmonary ROS,    Pulmonary exam normal        Cardiovascular negative cardio ROS Normal cardiovascular exam Rhythm:regular Rate:Normal     Neuro/Psych negative neurological ROS  negative psych ROS   GI/Hepatic negative GI ROS, Neg liver ROS,   Endo/Other  negative endocrine ROSdiabetes, Well Controlled, Type 2, Oral Hypoglycemic Agents  Renal/GU negative Renal ROS  negative genitourinary   Musculoskeletal   Abdominal   Peds  Hematology negative hematology ROS (+)   Anesthesia Other Findings Past Medical History: No date: Anxiety No date: Depression No date: Diabetes mellitus without complication ([name])     Comment:  Type II No date: Diabetes mellitus, type II ([name]) History reviewed. No pertinent surgical history. BMI    Body Mass Index:  33.25 kg/m     Reproductive/Obstetrics negative OB ROS                             Anesthesia Physical Anesthesia Plan  ASA: II  Anesthesia Plan: General   Post-op Pain Management:    Induction:   PONV Risk Score and Plan:   Airway Management Planned:   Additional Equipment:   Intra-op Plan:   Post-operative Plan:   Informed Consent: I have reviewed the patients History and Physical, chart, labs and discussed the procedure including the risks, benefits and alternatives for the proposed anesthesia with the patient or authorized representative who has indicated his/her understanding and acceptance.   Dental Advisory Given  Plan Discussed with: CRNA  Anesthesia Plan Comments:         Anesthesia Quick  Evaluation

## 2018-11-17 NOTE — Discharge Instructions (Signed)
1)  The drugs that you have been given will stay in your system until tomorrow so for the       next 24 hours you should not: ° A. Drive an automobile ° B. Make any legal decisions ° C. Drink any alcoholic beverages ° °2)  You may resume your regular meals upon return home. ° °3)  A responsible adult must take you home.  Someone should stay with you for a few          hours, then be available by phone for the remainder of the treatment day. ° °4)  You May experience any of the following symptoms: ° Headache, Nausea and a dry mouth (due to the medications you were given),  temporary memory loss and some confusion, or sore muscles (a warm bath  should help this).  If you you experience any of these symptoms let us know on                your return visit. ° °5)  Report any of the following: any acute discomfort, severe headache, or temperature        greater than 100.5 F.   Also report any unusual redness, swelling, drainage, or pain         at your IV site. ° °  You may report Symptoms to:  ECT PROGRAM- [place] [facility] °         Phone: [phone], ECT Department  °         or Dr. Clapac's office [phone] ° °6)  Your next ECT Treatment is Friday January 31  ° We will call 2 days prior to your scheduled appointment for arrival times. ° °7)  Nothing to eat or drink after midnight the night before your procedure. ° °8)  Take     With a sip of water the morning of your procedure. ° °9)  Other Instructions: Call [phone] to cancel the morning of your procedure due         to illness or emergency. ° °10) We will call within 72 hours to assess how you are feeling.  °

## 2018-11-19 NOTE — Anesthesia Postprocedure Evaluation (Signed)
Anesthesia Post Note  Patient: Renee Turner  Procedure(s) Performed: ECT TX  Patient location during evaluation: PACU Anesthesia Type: General Level of consciousness: awake and alert Pain management: pain level controlled Vital Signs Assessment: post-procedure vital signs reviewed and stable Respiratory status: spontaneous breathing, nonlabored ventilation, respiratory function stable and patient connected to nasal cannula oxygen Cardiovascular status: blood pressure returned to baseline and stable Postop Assessment: no apparent nausea or vomiting Anesthetic complications: no     Last Vitals:  Vitals:   11/17/18 1245 11/17/18 1253  BP: 118/70 113/71  Pulse: 81 81  Resp: 18 16  Temp:    SpO2: 100%     Last Pain:  Vitals:   11/17/18 1253  TempSrc: Oral  PainSc: 4                  Molli Barrows

## 2018-12-03 ENCOUNTER — Telehealth: Payer: Self-pay

## 2018-12-04 ENCOUNTER — Other Ambulatory Visit: Payer: Self-pay | Admitting: Psychiatry

## 2018-12-05 ENCOUNTER — Telehealth (HOSPITAL_COMMUNITY): Payer: Self-pay | Admitting: *Deleted

## 2018-12-05 ENCOUNTER — Inpatient Hospital Stay: Admission: RE | Admit: 2018-12-05 | Payer: Self-pay | Source: Ambulatory Visit

## 2018-12-05 ENCOUNTER — Encounter: Payer: Self-pay | Admitting: Anesthesiology

## 2018-12-05 NOTE — Telephone Encounter (Signed)
Received authorization for 5 units of ECT from 11/17/18-01/16/19 with Reference #[id number].

## 2018-12-17 ENCOUNTER — Emergency Department (HOSPITAL_COMMUNITY): Payer: BLUE CROSS/BLUE SHIELD

## 2018-12-17 ENCOUNTER — Encounter (HOSPITAL_COMMUNITY): Payer: Self-pay

## 2018-12-17 ENCOUNTER — Other Ambulatory Visit: Payer: Self-pay

## 2018-12-17 ENCOUNTER — Emergency Department (HOSPITAL_COMMUNITY)
Admission: EM | Admit: 2018-12-17 | Discharge: 2018-12-17 | Disposition: A | Discharge status: Home / Self Care | Payer: BLUE CROSS/BLUE SHIELD | Attending: Emergency Medicine | Admitting: Emergency Medicine

## 2018-12-17 DIAGNOSIS — E119 Type 2 diabetes mellitus without complications: Secondary | ICD-10-CM | POA: Insufficient documentation

## 2018-12-17 DIAGNOSIS — F329 Major depressive disorder, single episode, unspecified: Secondary | ICD-10-CM | POA: Diagnosis not present

## 2018-12-17 DIAGNOSIS — F419 Anxiety disorder, unspecified: Secondary | ICD-10-CM | POA: Diagnosis not present

## 2018-12-17 DIAGNOSIS — R079 Chest pain, unspecified: Secondary | ICD-10-CM | POA: Diagnosis not present

## 2018-12-17 DIAGNOSIS — Z79899 Other long term (current) drug therapy: Secondary | ICD-10-CM | POA: Insufficient documentation

## 2018-12-17 DIAGNOSIS — F1010 Alcohol abuse, uncomplicated: Secondary | ICD-10-CM | POA: Diagnosis not present

## 2018-12-17 DIAGNOSIS — Z7984 Long term (current) use of oral hypoglycemic drugs: Secondary | ICD-10-CM | POA: Diagnosis not present

## 2018-12-17 DIAGNOSIS — R109 Unspecified abdominal pain: Secondary | ICD-10-CM | POA: Diagnosis present

## 2018-12-17 LAB — COMPREHENSIVE METABOLIC PANEL
ALT: 14 U/L (ref 0–44)
ANION GAP: 9 (ref 5–15)
AST: 17 U/L (ref 15–41)
Albumin: 4.1 g/dL (ref 3.5–5.0)
Alkaline Phosphatase: 47 U/L (ref 38–126)
BUN: 7 mg/dL (ref 6–20)
CO2: 23 mmol/L (ref 22–32)
Calcium: 9.1 mg/dL (ref 8.9–10.3)
Chloride: 106 mmol/L (ref 98–111)
Creatinine, Ser: 0.66 mg/dL (ref 0.44–1.00)
GFR calc Af Amer: >60 mL/min (ref >60)
GFR calc non Af Amer: >60 mL/min (ref >60)
Glucose, Bld: 204 mg/dL — ABNORMAL HIGH (ref 70–99)
Potassium: 3.5 mmol/L (ref 3.5–5.1)
SODIUM: 138 mmol/L (ref 135–145)
Total Bilirubin: 0.6 mg/dL (ref 0.3–1.2)
Total Protein: 8.2 g/dL — ABNORMAL HIGH (ref 6.5–8.1)

## 2018-12-17 LAB — URINALYSIS, ROUTINE W REFLEX MICROSCOPIC
Bilirubin Urine: NEGATIVE
GLUCOSE, UA: 50 mg/dL — AB
Ketones, ur: 20 mg/dL — AB
Leukocytes,Ua: NEGATIVE
NITRITE: NEGATIVE
Protein, ur: NEGATIVE mg/dL
Specific Gravity, Urine: 1.027 (ref 1.005–1.030)
pH: 5 (ref 5.0–8.0)

## 2018-12-17 LAB — CBC WITH DIFFERENTIAL/PLATELET
Abs Immature Granulocytes: 0.04 10*3/uL (ref 0.00–0.07)
Basophils Absolute: 0 10*3/uL (ref 0.0–0.1)
Basophils Relative: 0 %
Eosinophils Absolute: 0.1 10*3/uL (ref 0.0–0.5)
Eosinophils Relative: 1 %
HCT: 38.1 % (ref 36.0–46.0)
Hemoglobin: 11.8 g/dL — ABNORMAL LOW (ref 12.0–15.0)
Immature Granulocytes: 0 %
Lymphocytes Relative: 36 %
Lymphs Abs: 3.6 10*3/uL (ref 0.7–4.0)
MCH: 26.6 pg (ref 26.0–34.0)
MCHC: 31 g/dL (ref 30.0–36.0)
MCV: 86 fL (ref 80.0–100.0)
Monocytes Absolute: 0.6 10*3/uL (ref 0.1–1.0)
Monocytes Relative: 6 %
Neutro Abs: 5.6 10*3/uL (ref 1.7–7.7)
Neutrophils Relative %: 57 %
Platelets: 302 10*3/uL (ref 150–400)
RBC: 4.43 MIL/uL (ref 3.87–5.11)
RDW: 14.1 % (ref 11.5–15.5)
WBC: 10 10*3/uL (ref 4.0–10.5)
nRBC: 0 % (ref 0.0–0.2)

## 2018-12-17 LAB — LIPASE, BLOOD: Lipase: 27 U/L (ref 11–51)

## 2018-12-17 LAB — CBG MONITORING, ED: Glucose-Capillary: 164 mg/dL — ABNORMAL HIGH (ref 70–99)

## 2018-12-17 LAB — TROPONIN I: Troponin I: <0.03 ng/mL (ref <0.03)

## 2018-12-17 MED ORDER — SODIUM CHLORIDE 0.9 % IV BOLUS
1000.0000 mL | Freq: Once | INTRAVENOUS | Status: AC
  Administered 2018-12-17: 1000 mL via INTRAVENOUS
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
## 2018-12-17 NOTE — ED Notes (Signed)
Renee Sievert, PA, patient does not meet inpatient criteria. Patient to schedule appt for medication refill with outpatient provider. [place], RN, patient informed of disposition.

## 2018-12-17 NOTE — ED Notes (Signed)
Patient ambulated to restroom with no problems to provide urine sample.

## 2018-12-17 NOTE — ED Provider Notes (Signed)
[facility] DEPT Provider Note   CSN: [id number] Arrival date & time: 12/17/18  1702     History   Chief Complaint Chief Complaint  Patient presents with  . elevated heart rate  . Abdominal Pain    HPI Renee Turner is a 23 y.o. female.  23 y.o female with a PMH of DM, MDD, Anxiety presents to the ED with a chief complaint of elevated heart rate and abdominal pain. She reports her heart rates has been ~120 with walking and standing but lower at rest. Patient reports has been out of her Prozac and metformin for about 2 weeks. She is currently under the care of Dr. Lorne Skeens, reports she called the office to make an appointment but does not have anything scheduled. She endorses some nausea along with chest pain which has been intermittent for the past two days. She has not tried any medical therapy for relieve in symptoms. She was asked about her abdominal pain, she denies any at the present moment. Denies any vomiting, diarrhea, shortness of breath or fevers. She denies any SI, HI, or hallucinations.    Abdominal Pain  Associated symptoms: chest pain   Associated symptoms: no chills, no cough, no dysuria, no fever, no hematuria, no shortness of breath, no sore throat and no vomiting     Past Medical History:  Diagnosis Date  . Anxiety   . Depression   . Diabetes mellitus without complication ([place])    Type II  . Diabetes mellitus, type II [facility])     Patient Active Problem List   Diagnosis Date Noted  . Alcohol abuse 09/25/2018  . Severe recurrent major depression without psychotic features ([place]) 05/14/2018  . MDD (major depressive disorder), recurrent severe, without psychosis ([place]) 05/06/2018  . 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 Posttraumatic stress disorder 11/13/2016  . Severe episode of recurrent major depressive disorder, without psychotic features ([place]) 11/07/2016  . Diabetes ([place]) 11/07/2016  . Diabetic neuropathy associated  with type 2 diabetes mellitus ([place]) 07/19/2016  . Obesity (BMI 30.0-34.9) 07/19/2016  . Type 2 diabetes mellitus with hyperglycemia, without long-term current use of insulin ([place]) 07/19/2016    History reviewed. No pertinent surgical history.   OB History   No obstetric history on file.      Home Medications    Prior to Admission medications   Medication Sig Start Date End Date Taking? Authorizing Provider  ARIPiprazole (ABILIFY) 10 MG tablet Take 1 tablet (10 mg total) by mouth daily. 09/29/18  Yes Clapacs, Madie Reno, MD  dapagliflozin propanediol (FARXIGA) 10 MG TABS tablet Take 10 mg by mouth daily.   Yes [provider]  ferrous sulfate 325 (65 FE) MG tablet Take 1 tablet (325 mg total) by mouth daily with breakfast. 06/09/18  Yes Clapacs, Madie Reno, MD  FLUoxetine (PROZAC) 40 MG capsule Take 1 capsule (40 mg total) by mouth daily. 09/29/18  Yes Clapacs, Madie Reno, MD  metFORMIN (GLUCOPHAGE) 1000 MG tablet Take 1 tablet (1,000 mg total) by mouth 2 (two) times daily with a meal. 09/29/18  Yes Clapacs, John T, MD  OZEMPIC, 0.25 OR 0.5 MG/DOSE, 2 MG/1.5ML SOPN Inject 0.25 mg as directed every 7 (seven) days. X 4 weeks started 01/23, then increase to 0.5 mg weekly 11/27/18  Yes [provider]  traZODone (DESYREL) 100 MG tablet Take 1 tablet (100 mg total) by mouth at bedtime. Patient taking differently: Take 100 mg by mouth at bedtime as needed for sleep.  09/29/18  Yes Clapacs, Madie Reno, MD    Family History Family History  Problem Relation Age of Onset  . Diabetes Mother   . Hypertension Father     Social History Social History   Tobacco Use  . Smoking status: Never Smoker  . Smokeless tobacco: Never Used  Substance Use Topics  . Alcohol use: Not Currently  . Drug use: No     Allergies   Patient has no known allergies.   Review of Systems Review of Systems  Constitutional: Negative for chills and fever.  HENT: Negative for ear pain and sore throat.   Eyes:  Negative for pain and visual disturbance.  Respiratory: Negative for cough and shortness of breath.   Cardiovascular: Positive for chest pain. Negative for palpitations.  Gastrointestinal: Positive for abdominal pain. Negative for vomiting.  Genitourinary: Negative for dysuria and hematuria.  Musculoskeletal: Negative for arthralgias and back pain.  Skin: Negative for color change and rash.  Neurological: Negative for seizures and syncope.  All other systems reviewed and are negative.    Physical Exam Updated Vital Signs BP 112/84   Pulse 89   Temp 98.4 F (36.9 C) (Oral)   Resp 17   Ht '5\' 6"'  (1.676 m)   Wt 91.2 kg   LMP 12/10/2018   SpO2 100%   BMI 32.44 kg/m   Physical Exam Constitutional:      General: She is not in acute distress.    Appearance: She is well-developed.  HENT:     Head: Atraumatic.     Comments: No facial, nasal, scalp bone tenderness. No obvious contusions or skin abrasions.     Ears:     Comments: No hemotympanum. No Battle's sign.    Nose:     Comments: No intranasal bleeding or rhinorrhea. Septum midline    Mouth/Throat:     Comments: No intraoral bleeding or injury. No malocclusion. MMM. Dentition appears stable.  Eyes:     Conjunctiva/sclera: Conjunctivae normal.     Comments: Lids normal. EOMs and PERRL intact. No racoon's eyes   Neck:     Comments: C-spine: no midline or paraspinal muscular tenderness. Full active ROM of cervical spine w/o pain. Trachea midline Cardiovascular:     Rate and Rhythm: Normal rate and regular rhythm.     Pulses:          Radial pulses are 1+ on the right side and 1+ on the left side.       Dorsalis pedis pulses are 1+ on the right side and 1+ on the left side.     Heart sounds: Normal heart sounds, S1 normal and S2 normal.  Pulmonary:     Effort: Pulmonary effort is normal.     Breath sounds: Normal breath sounds. No decreased breath sounds.  Abdominal:     Palpations: Abdomen is soft.     Tenderness: There  is no abdominal tenderness.     Comments: No guarding. No seatbelt sign.   Musculoskeletal: Normal range of motion.        General: No deformity.     Comments: T-spine: no paraspinal muscular tenderness or midline tenderness.   L-spine: no paraspinal muscular or midline tenderness.  Pelvis: no instability with AP/L compression, leg shortening or rotation. Full PROM of hips bilaterally without pain. Negative SLR bilaterally.   Skin:    General: Skin is warm and dry.     Capillary Refill: Capillary refill takes less than 2 seconds.  Neurological:     Mental  Status: She is alert, oriented to person, place, and time and easily aroused.     Comments: Speech is fluent without obvious dysarthria or dysphasia. Strength 5/5 with hand grip and ankle F/E.   Sensation to light touch intact in hands and feet.  CN II-XII grossly intact bilaterally.   Psychiatric:        Mood and Affect: Mood is depressed. Mood is not anxious. Affect is flat.        Behavior: Behavior normal. Behavior is cooperative.        Thought Content: Thought content normal.      ED Treatments / Results  Labs (all labs ordered are listed, but only abnormal results are displayed) Labs Reviewed  CBC WITH DIFFERENTIAL/PLATELET - Abnormal; Notable for the following components:      Result Value   Hemoglobin 11.8 (*)    All other components within normal limits  COMPREHENSIVE METABOLIC PANEL - Abnormal; Notable for the following components:   Glucose, Bld 204 (*)    Total Protein 8.2 (*)    All other components within normal limits  URINALYSIS, ROUTINE W REFLEX MICROSCOPIC - Abnormal; Notable for the following components:   APPearance HAZY (*)    Glucose, UA 50 (*)    Hgb urine dipstick MODERATE (*)    Ketones, ur 20 (*)    Bacteria, UA RARE (*)    All other components within normal limits  CBG MONITORING, ED - Abnormal; Notable for the following components:   Glucose-Capillary 164 (*)    All other components within  normal limits  LIPASE, BLOOD  TROPONIN I    EKG None  Radiology Dg Chest 2 View  Result Date: 12/17/2018 CLINICAL DATA:  Chest pain, heart racing, abdominal pain for 2 days, history type II diabetes mellitus EXAM: CHEST - 2 VIEW COMPARISON:  None FINDINGS: Normal heart size, mediastinal contours, and pulmonary vascularity. Lungs clear. No pleural effusion or pneumothorax. Bones unremarkable. IMPRESSION: Normal exam. Electronically Signed   By: Lavonia Dana M.D.   On: 12/17/2018 19:13    Procedures Procedures (including critical care time)  Medications Ordered in ED Medications  sodium chloride 0.9 % bolus 1,000 mL (0 mLs Intravenous Stopped 12/17/18 2033)     Initial Impression / Assessment and Plan / ED Course  I have reviewed the triage vital signs and the nursing notes.  Pertinent labs & imaging results that were available during my care of the patient were reviewed by me and considered in my medical decision making (see chart for details).    Patient presents with chest pain, abdominal pain and medication refill. Patient reports she's been out of her medication for the past two weeks. During evaluation patient's affect is flat. She states feeling her heart rate going from 120 walking to 60-70 at rest.  CBC showed no leukocytosis, hemoglobin is low but within patient's normal limits. CMP showed elevated glucose, she has been out of metformin as well. No electrolyte abnormality.   Due to patient's affect thought she could benefit from TTS consult, TTS recommended outpatient management as required.  First troponin was negative, chest x-ray was clear and showed no acute process.  UA showed no leukocytosis, no nitrites, denies any urinary symptoms at this time.  CBG was 164.  Orthostatic was negative.  At this time patient is well-appearing, stable vital signs.  Negative EKG, negative chest x-ray, reassuring blood work.  At this time advised patient that she needs to follow-up with her  PCP in order  to get refill of her medications, she understands and agrees with management.  Vital signs stable, return precautions provided.  Final Clinical Impressions(s) / ED Diagnoses   Final diagnoses:  Chest pain, unspecified type    ED Discharge Orders    None       Janeece Fitting, Hershal Coria 12/17/18 2202    Drenda Freeze, MD 12/17/18 [phone]

## 2018-12-17 NOTE — Discharge Instructions (Addendum)
Please make an appointment with your primary care physician to have a refill of all your medications.  All your laboratory results along with your chest x-ray were within normal limits today.

## 2018-12-17 NOTE — ED Notes (Addendum)
Gold and and Light Blue sent in save tubes to labs. Patient aware we need urine sample and will try when back from [facility].

## 2018-12-17 NOTE — ED Notes (Signed)
[facility] NP stated she did not meet in-patient criteria.

## 2018-12-17 NOTE — ED Notes (Signed)
Patient transported to X-ray 

## 2018-12-17 NOTE — ED Triage Notes (Signed)
patient states her heart felt like it was racing x 2 weeks. Patient states, "It only happens when I stand up." Patient also c/o abdominal pain x 2 days. CBG-164.  Patient states she is out of her usual home meds and has been x 2 weeks. Includes psych meds and meds for diabetes.

## 2019-07-23 IMAGING — CR DG CHEST 2V
2 series · 2 of 2 positions shown · non-contrast
Comparison: None

CLINICAL DATA: Chest pain, heart racing, abdominal pain for 2 days,
history type II diabetes mellitus

EXAM:
CHEST - 2 VIEW

[w chest pa]
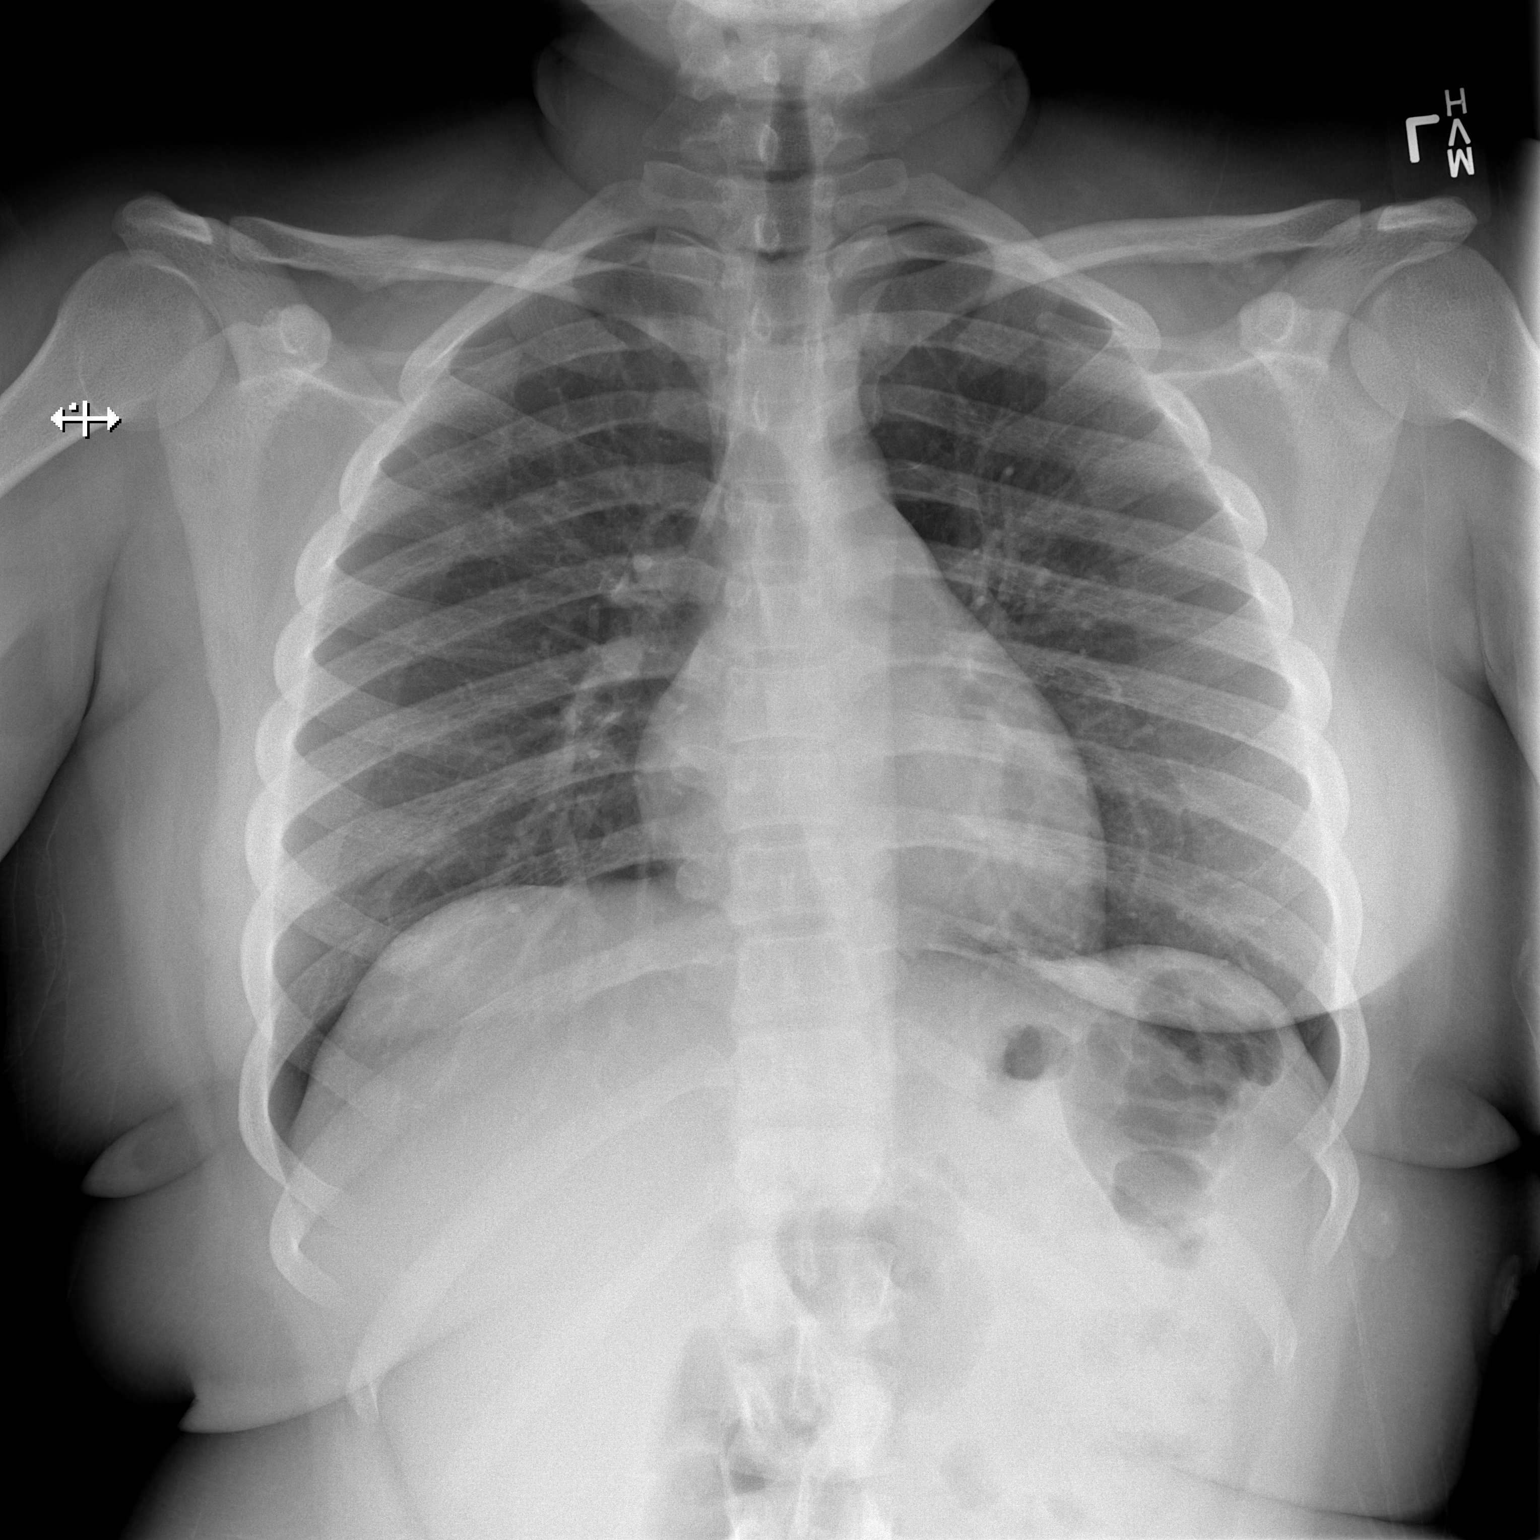

[w chest lat]
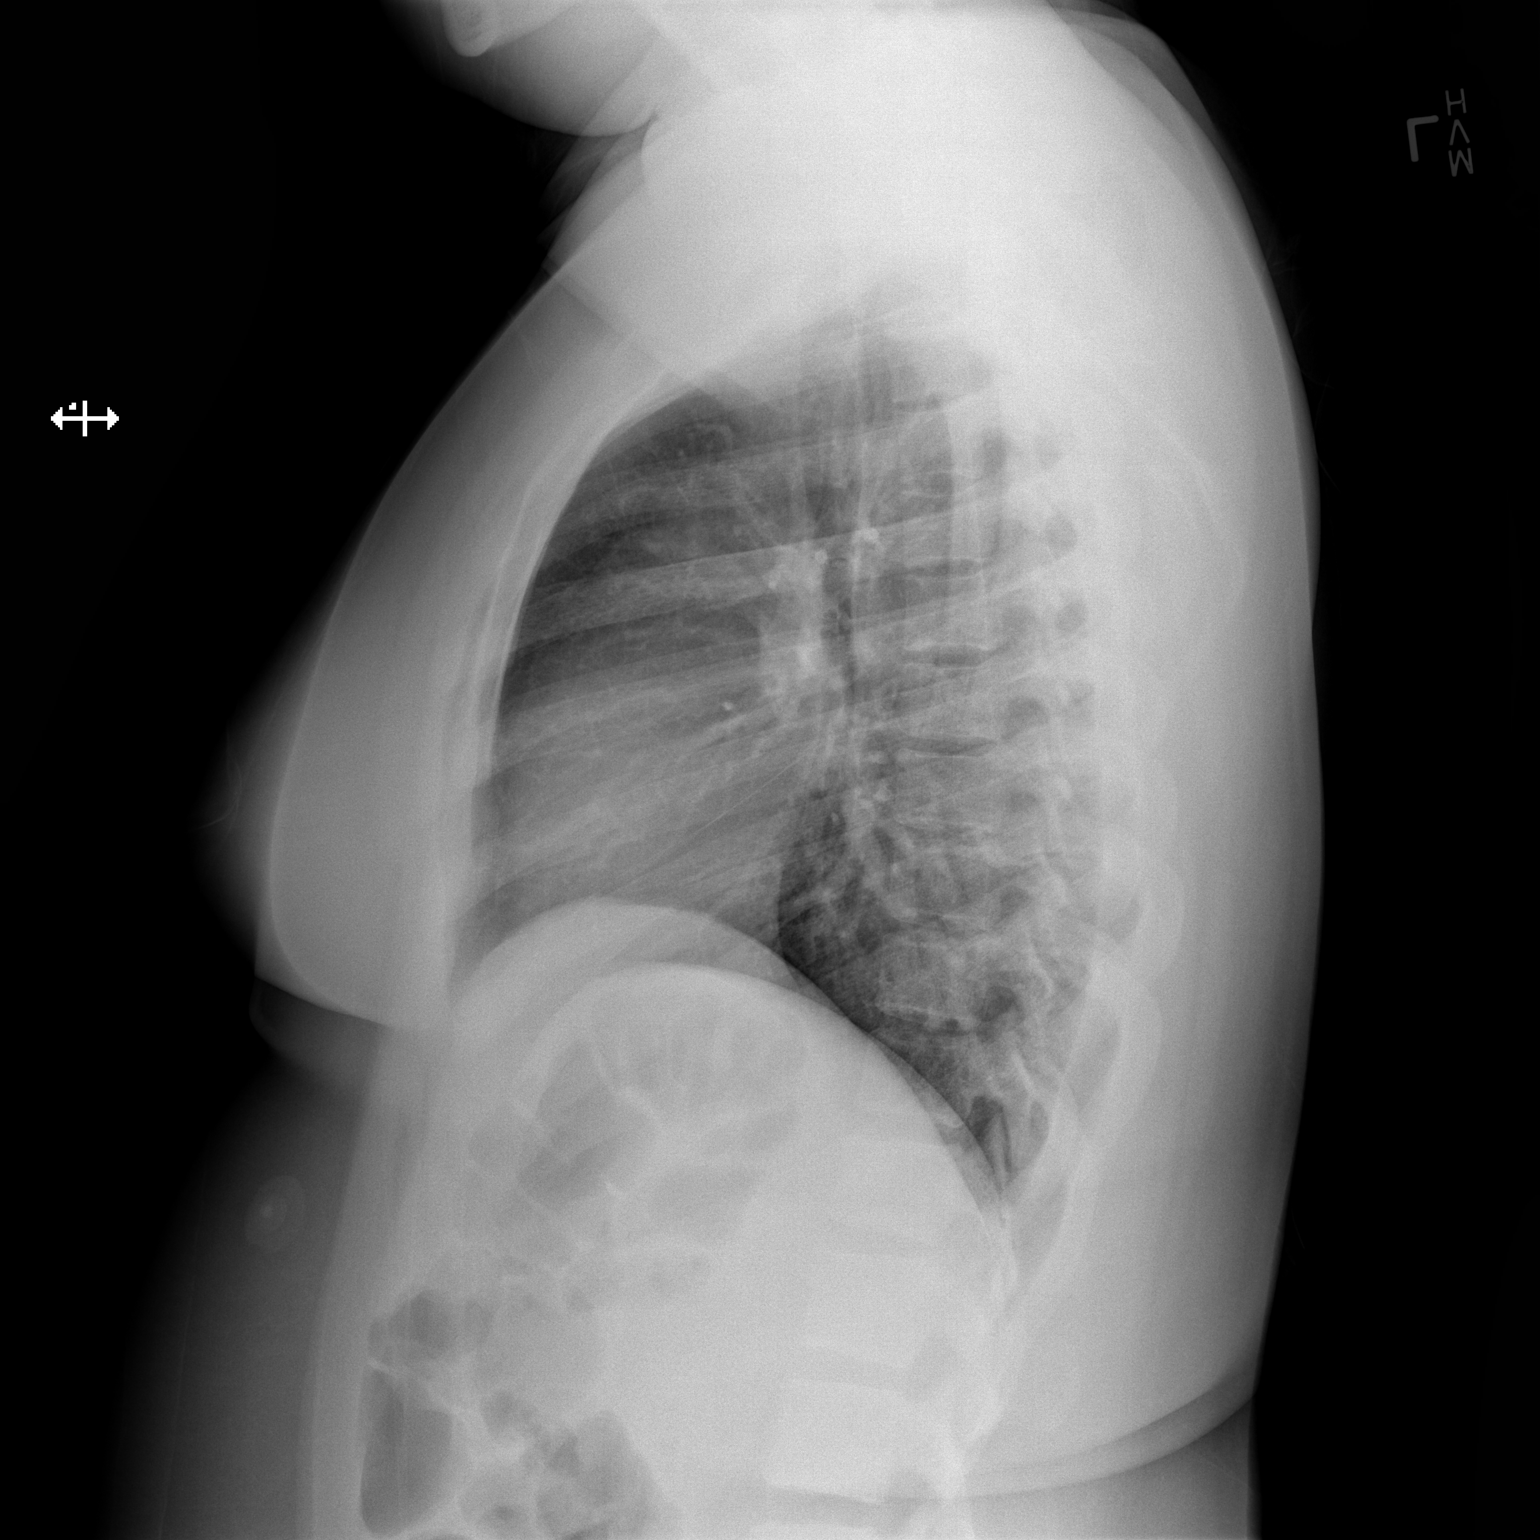

[2 of 2 positions shown; findings below may reference images not displayed]

FINDINGS: Normal heart size, mediastinal contours, and pulmonary vascularity.

Lungs clear.

No pleural effusion or pneumothorax.

Bones unremarkable.
IMPRESSION: Normal exam.
